# Patient Record
Sex: Female | Born: 1985 | Race: White | Hispanic: No | Marital: Married | Smoking: Former smoker
Health system: Southern US, Community
[De-identification: ages and names within clinical notes are randomized; demographics above are authoritative.]

## PROBLEM LIST (undated history)

## (undated) DIAGNOSIS — A4902 Methicillin resistant Staphylococcus aureus infection, unspecified site: Secondary | ICD-10-CM

## (undated) DIAGNOSIS — J45909 Unspecified asthma, uncomplicated: Secondary | ICD-10-CM

## (undated) DIAGNOSIS — F329 Major depressive disorder, single episode, unspecified: Secondary | ICD-10-CM

## (undated) DIAGNOSIS — J309 Allergic rhinitis, unspecified: Secondary | ICD-10-CM

## (undated) DIAGNOSIS — F410 Panic disorder [episodic paroxysmal anxiety] without agoraphobia: Secondary | ICD-10-CM

## (undated) DIAGNOSIS — K449 Diaphragmatic hernia without obstruction or gangrene: Secondary | ICD-10-CM

## (undated) DIAGNOSIS — F32A Depression, unspecified: Secondary | ICD-10-CM

## (undated) DIAGNOSIS — R519 Headache, unspecified: Secondary | ICD-10-CM

## (undated) DIAGNOSIS — K219 Gastro-esophageal reflux disease without esophagitis: Secondary | ICD-10-CM

## (undated) DIAGNOSIS — R112 Nausea with vomiting, unspecified: Secondary | ICD-10-CM

## (undated) DIAGNOSIS — R000 Tachycardia, unspecified: Secondary | ICD-10-CM

## (undated) DIAGNOSIS — K2210 Ulcer of esophagus without bleeding: Secondary | ICD-10-CM

## (undated) DIAGNOSIS — R9431 Abnormal electrocardiogram [ECG] [EKG]: Secondary | ICD-10-CM

## (undated) DIAGNOSIS — R51 Headache: Secondary | ICD-10-CM

## (undated) DIAGNOSIS — K589 Irritable bowel syndrome without diarrhea: Secondary | ICD-10-CM

## (undated) DIAGNOSIS — J189 Pneumonia, unspecified organism: Secondary | ICD-10-CM

## (undated) DIAGNOSIS — Z9889 Other specified postprocedural states: Secondary | ICD-10-CM

## (undated) DIAGNOSIS — R12 Heartburn: Secondary | ICD-10-CM

## (undated) DIAGNOSIS — F411 Generalized anxiety disorder: Secondary | ICD-10-CM

## (undated) DIAGNOSIS — F419 Anxiety disorder, unspecified: Secondary | ICD-10-CM

## (undated) DIAGNOSIS — D649 Anemia, unspecified: Secondary | ICD-10-CM

## (undated) DIAGNOSIS — Z87442 Personal history of urinary calculi: Secondary | ICD-10-CM

## (undated) DIAGNOSIS — O99019 Anemia complicating pregnancy, unspecified trimester: Secondary | ICD-10-CM

## (undated) DIAGNOSIS — E785 Hyperlipidemia, unspecified: Secondary | ICD-10-CM

## (undated) HISTORY — PX: OVARIAN CYST REMOVAL: SHX89

## (undated) HISTORY — DX: Ulcer of esophagus without bleeding: K22.10

## (undated) HISTORY — DX: Depression, unspecified: F32.A

## (undated) HISTORY — DX: Unspecified asthma, uncomplicated: J45.909

## (undated) HISTORY — DX: Allergic rhinitis, unspecified: J30.9

## (undated) HISTORY — PX: TONSILLECTOMY: SUR1361

## (undated) HISTORY — DX: Heartburn: R12

## (undated) HISTORY — DX: Diaphragmatic hernia without obstruction or gangrene: K44.9

## (undated) HISTORY — DX: Panic disorder (episodic paroxysmal anxiety): F41.0

## (undated) HISTORY — DX: Anxiety disorder, unspecified: F41.9

## (undated) HISTORY — PX: PLANTAR FASCIA SURGERY: SHX746

## (undated) HISTORY — DX: Irritable bowel syndrome without diarrhea: K58.9

## (undated) HISTORY — PX: TUBAL LIGATION: SHX77

## (undated) HISTORY — DX: Major depressive disorder, single episode, unspecified: F32.9

## (undated) HISTORY — PX: TONSILLECTOMY: SHX5217

## (undated) HISTORY — DX: Methicillin resistant Staphylococcus aureus infection, unspecified site: A49.02

## (undated) HISTORY — PX: ESOPHAGOGASTRODUODENOSCOPY: SHX1529

---

## 2005-10-20 ENCOUNTER — Ambulatory Visit: Payer: Self-pay | Admitting: Urology

## 2005-12-17 ENCOUNTER — Emergency Department: Payer: Self-pay | Admitting: Emergency Medicine

## 2009-09-28 ENCOUNTER — Ambulatory Visit: Payer: Self-pay | Admitting: Unknown Physician Specialty

## 2011-01-30 ENCOUNTER — Encounter: Payer: Self-pay | Admitting: Obstetrics & Gynecology

## 2011-04-29 ENCOUNTER — Encounter: Payer: Self-pay | Admitting: Pediatric Cardiology

## 2011-09-02 ENCOUNTER — Inpatient Hospital Stay: Payer: Self-pay | Admitting: Obstetrics & Gynecology

## 2012-12-19 ENCOUNTER — Emergency Department: Payer: Self-pay | Admitting: Emergency Medicine

## 2012-12-19 LAB — URINALYSIS, COMPLETE
Bacteria: NONE SEEN
Bilirubin,UR: NEGATIVE
Glucose,UR: NEGATIVE mg/dL (ref 0–75)
Ketone: NEGATIVE
Nitrite: NEGATIVE
Protein: NEGATIVE
RBC,UR: 1 /HPF (ref 0–5)
WBC UR: 1 /HPF (ref 0–5)

## 2012-12-19 LAB — DRUG SCREEN, URINE
Amphetamines, Ur Screen: NEGATIVE (ref ?–1000)
Barbiturates, Ur Screen: NEGATIVE (ref ?–200)
Cocaine Metabolite,Ur ~~LOC~~: NEGATIVE (ref ?–300)
Opiate, Ur Screen: NEGATIVE (ref ?–300)
Phencyclidine (PCP) Ur S: NEGATIVE (ref ?–25)
Tricyclic, Ur Screen: NEGATIVE (ref ?–1000)

## 2012-12-19 LAB — CBC
HCT: 39.1 % (ref 35.0–47.0)
MCH: 31.4 pg (ref 26.0–34.0)
MCV: 89 fL (ref 80–100)
RDW: 12.1 % (ref 11.5–14.5)

## 2012-12-19 LAB — COMPREHENSIVE METABOLIC PANEL
Alkaline Phosphatase: 81 U/L (ref 50–136)
Chloride: 108 mmol/L — ABNORMAL HIGH (ref 98–107)
Creatinine: 0.57 mg/dL — ABNORMAL LOW (ref 0.60–1.30)
EGFR (Non-African Amer.): >60
Glucose: 103 mg/dL — ABNORMAL HIGH (ref 65–99)
Osmolality: 285 (ref 275–301)
Potassium: 3.6 mmol/L (ref 3.5–5.1)
SGOT(AST): 21 U/L (ref 15–37)
SGPT (ALT): 26 U/L (ref 12–78)
Sodium: 143 mmol/L (ref 136–145)
Total Protein: 7.3 g/dL (ref 6.4–8.2)

## 2012-12-19 LAB — SALICYLATE LEVEL: Salicylates, Serum: <1.7 mg/dL

## 2012-12-19 LAB — ETHANOL: Ethanol %: <0.003 % (ref 0.000–0.080)

## 2012-12-19 LAB — TSH: Thyroid Stimulating Horm: 0.774 u[IU]/mL

## 2013-08-02 ENCOUNTER — Ambulatory Visit: Payer: Self-pay | Admitting: Urgent Care

## 2013-08-16 ENCOUNTER — Ambulatory Visit: Payer: Self-pay | Admitting: Obstetrics & Gynecology

## 2013-08-16 LAB — CBC
HCT: 38.8 % (ref 35.0–47.0)
MCV: 89 fL (ref 80–100)
Platelet: 320 10*3/uL (ref 150–440)
RDW: 11.6 % (ref 11.5–14.5)

## 2013-08-18 ENCOUNTER — Ambulatory Visit: Payer: Self-pay | Admitting: Obstetrics & Gynecology

## 2013-08-26 ENCOUNTER — Emergency Department: Payer: Self-pay | Admitting: Emergency Medicine

## 2014-01-15 DIAGNOSIS — K589 Irritable bowel syndrome without diarrhea: Secondary | ICD-10-CM

## 2014-01-15 HISTORY — DX: Irritable bowel syndrome, unspecified: K58.9

## 2014-01-15 HISTORY — PX: COLONOSCOPY: SHX174

## 2014-02-02 ENCOUNTER — Ambulatory Visit: Payer: Self-pay | Admitting: Gastroenterology

## 2014-12-15 DIAGNOSIS — J189 Pneumonia, unspecified organism: Secondary | ICD-10-CM

## 2014-12-15 HISTORY — DX: Pneumonia, unspecified organism: J18.9

## 2015-04-06 NOTE — Op Note (Signed)
PATIENT NAME:  [name], Deborah H MR#:  [id number] DATE OF BIRTH:  May 05, 1986  DATE OF PROCEDURE:  08/18/2013  PREOPERATIVE DIAGNOSES:  Pelvic pain, ovarian cyst.   POSTOPERATIVE DIAGNOSES: Right lower quadrant pain, bilateral tubal cysts, possible endometriosis.   PROCEDURE PERFORMED: Operative laparoscopy, excision of bilateral tubal cysts, right greater than left; peritoneal biopsy of lesion, possible endometriosis, biopsy and excision of omental nodule near sigmoid colon.   SURGEON: Annamarie MajorPaul Landan Fedie, M.D.   ANESTHESIA: General.   ESTIMATED BLOOD LOSS: Minimal.   COMPLICATIONS: None.   FINDINGS: Bilateral tubal cysts, right greater than left; white plaque disease, possible endometriosis lining the peritoneum, omental nodule near the sigmoid, normal ovaries and uterus.   DISPOSITION: To the recovery room in stable condition.   TECHNIQUE: The patient is prepped in the usual sterile fashion after adequate anesthesia is obtained in the dorsal lithotomy position. Foley catheter is inserted, and a Hulka tenaculum is placed on the cervix.   Attention is then turned to the abdomen where a Veress needle is inserted through a 5 mm infraumbilical incision after Marcaine is used to anesthetize the skin. Veress needle placement is confirmed using the hanging drop technique, and the abdomen is then insufflated with CO2 gas. A 5 mm trocar is then inserted under visualization with the laparoscope with no injuries or bleeding noted. The patient is placed in Trendelenburg positioning, and the above-mentioned findings were visualized.   A 5 mm trocar is placed on the left lower quadrant lateral to the inferior epigastric blood vessels, and an 11 mm trocar is placed in the suprapubic region with no injuries or bleeding noted. The omental nodule overlying the sigmoid colon is grasped and has no connection to the serosa over the colon. It is excised using the 5 mm Harmonic scalpel and sent to pathology for further  review. The right tubal cyst approximately 2 cm in diameter is carefully dissected away from the fallopian tube, excised and sent to pathology for further review. The left smaller tubal cysts are also excised and sent to pathology for further review using the Harmonic scalpel. Excellent hemostasis was noted at all the operative sites. There were several areas of a white plaque-like disease along the peritoneal surfaces and 1 area near the left uterosacral ligament is carefully tented up and dissected free from the underlying tissues and sent to pathology for further analysis. Excellent hemostasis was noted at this site as well. The pelvic cavity is irrigated with aspiration of all fluid. Excellent hemostasis is noted. There is no apparent injury to bowel, bladder, ureter or other structures. Gas is expelled. The patient is leveled. Trocars were removed, and skin is closed with Dermabond. Foley the tenaculum were removed. The patient goes to the recovery room in stable condition. All sponge, instrument and needle counts were correct.    ____________________________ R. Annamarie MajorPaul Mitchelle Goerner, MD rph:dmm D: 08/18/2013 15:10:02 ET T: 08/18/2013 15:26:49 ET JOB#: [id number]  cc: Dierdre Searles. Paul Jayvan Mcshan, MD, <Dictator> Nadara MustardOBERT P Bexleigh Theriault MD ELECTRONICALLY SIGNED 08/18/2013 18:15

## 2015-04-09 ENCOUNTER — Ambulatory Visit
Admit: 2015-04-09 | Disposition: A | Discharge status: Home / Self Care | Payer: Self-pay | Attending: Unknown Physician Specialty | Admitting: Unknown Physician Specialty

## 2015-05-16 ENCOUNTER — Other Ambulatory Visit: Payer: Self-pay | Admitting: Family Medicine

## 2015-05-16 ENCOUNTER — Telehealth: Payer: Self-pay

## 2015-05-16 ENCOUNTER — Ambulatory Visit
Admission: RE | Admit: 2015-05-16 | Discharge: 2015-05-16 | Disposition: A | Discharge status: Home / Self Care | Payer: 59 | Source: Ambulatory Visit | Attending: Family Medicine | Admitting: Family Medicine

## 2015-05-16 DIAGNOSIS — M5410 Radiculopathy, site unspecified: Secondary | ICD-10-CM

## 2015-05-16 DIAGNOSIS — M5412 Radiculopathy, cervical region: Secondary | ICD-10-CM | POA: Insufficient documentation

## 2015-05-16 NOTE — Telephone Encounter (Signed)
Pt. Notified.

## 2015-05-16 NOTE — Telephone Encounter (Signed)
-----   Message from Kerman PasseyMelinda P Lada, MD sent at 05/16/2015  3:40 PM EDT ----- Let pt know that her xray is indeed abnormal, and let's get the cervical spine MRI (already ordered) Nothing worrisome, but she may have a developmental problem from childhood that contributes Thank you

## 2015-05-21 ENCOUNTER — Telehealth: Payer: Self-pay | Admitting: Family Medicine

## 2015-05-21 NOTE — Telephone Encounter (Signed)
Pt. Notified that the referral was still in progress and that i'll check with Tiffany in the morning about the status of it.

## 2015-05-21 NOTE — Telephone Encounter (Signed)
Pt called wanting to know what was going on with her MRI.  She declined talking to referral coordinator, states she felt coordinator was rude.  Please call her.

## 2015-05-23 ENCOUNTER — Other Ambulatory Visit: Payer: Self-pay | Admitting: Family Medicine

## 2015-05-23 DIAGNOSIS — M5412 Radiculopathy, cervical region: Secondary | ICD-10-CM

## 2015-05-29 ENCOUNTER — Encounter: Payer: Self-pay | Admitting: Family Medicine

## 2015-05-29 ENCOUNTER — Ambulatory Visit
Admission: RE | Admit: 2015-05-29 | Discharge: 2015-05-29 | Disposition: A | Discharge status: Home / Self Care | Payer: 59 | Source: Ambulatory Visit | Attending: Family Medicine | Admitting: Family Medicine

## 2015-05-29 ENCOUNTER — Telehealth: Payer: Self-pay | Admitting: Family Medicine

## 2015-05-29 DIAGNOSIS — M5412 Radiculopathy, cervical region: Secondary | ICD-10-CM | POA: Insufficient documentation

## 2015-05-29 NOTE — Telephone Encounter (Signed)
MRI back I typed a letter, but can't print it from home Please send Referral to neuro entered

## 2015-05-30 ENCOUNTER — Telehealth: Payer: Self-pay | Admitting: Family Medicine

## 2015-05-30 MED ORDER — GABAPENTIN 300 MG PO CAPS: 300.0000 mg | ORAL_CAPSULE | Freq: Three times a day (TID) | ORAL | Status: DC

## 2015-05-30 NOTE — Telephone Encounter (Signed)
I called patient Explained why the referral to neurologist about the radiculopathy She was going to see Dr. Clelia Croft, headaches, appt is July 11th Let's increase gabapentin  Deborah Fields --> please see if Dr. Clelia Croft can see her for BOTH issues (cervical radiculopathy and headaches) so she doesn't have to see two different neurologist, and please call patient and update both neurologists with appt info (patient has not scheduled yet with )

## 2015-05-30 NOTE — Telephone Encounter (Signed)
Pt said neurology called and shes not sure why she needs to schedule appt because she hasnt heard results from mri yesterday

## 2015-05-30 NOTE — Telephone Encounter (Signed)
Dr. Sherie Don, I know you wrote a letter and it's being mailed to her, but now she has already gotten a call about the referral. I know you put info about the results, but she will be asking more info than I know about the results.

## 2015-06-29 ENCOUNTER — Other Ambulatory Visit: Payer: Self-pay

## 2015-06-29 MED ORDER — PROMETHAZINE HCL 25 MG PO TABS: 25.0000 mg | ORAL_TABLET | Freq: Four times a day (QID) | ORAL | Status: DC | PRN

## 2015-06-29 NOTE — Telephone Encounter (Signed)
Patient was last seen on 05/10/15, practice partner number is [id number], and [facility] [address]

## 2015-07-10 ENCOUNTER — Telehealth: Payer: Self-pay | Admitting: Family Medicine

## 2015-07-10 ENCOUNTER — Telehealth: Payer: Self-pay | Admitting: Unknown Physician Specialty

## 2015-07-10 MED ORDER — PROMETHAZINE HCL 25 MG PO TABS: 25.0000 mg | ORAL_TABLET | Freq: Four times a day (QID) | ORAL | Status: DC | PRN

## 2015-07-10 NOTE — Telephone Encounter (Signed)
Patient just had it refilled on 06/29/15. Left message for patient to call and see if she needs a refill already.

## 2015-07-10 NOTE — Telephone Encounter (Signed)
done

## 2015-07-10 NOTE — Telephone Encounter (Signed)
Pt called stated pharmacy has sent several faxes for a refill on Phenergen with no response. [facility] [address] in [place]. Thanks.

## 2015-07-10 NOTE — Telephone Encounter (Signed)
The prescription was printed instead of being sent straight to the pharmacy. Patient wants to know if it can be resent straight to the pharmacy or if she needs to come pick it up. [facility] [address].

## 2015-07-10 NOTE — Telephone Encounter (Signed)
[facility] [address] Called needing refill on Deborah Fields Rx: promethazine (PHENERGAN) 25 MG tablet.

## 2015-07-11 NOTE — Telephone Encounter (Signed)
I spoke with patient, she said she didn't need it refilled, the refill message was sent because she didn't realize Elnita Maxwell had wrote it and left it here for her to pick up the rx.

## 2015-09-12 ENCOUNTER — Encounter: Payer: Self-pay | Admitting: Family Medicine

## 2015-09-12 ENCOUNTER — Ambulatory Visit (INDEPENDENT_AMBULATORY_CARE_PROVIDER_SITE_OTHER): Payer: 59 | Admitting: Family Medicine

## 2015-09-12 VITALS — BP 122/78 | HR 104 | Temp 98.9°F | Ht 66.5 in | Wt 205.0 lb

## 2015-09-12 DIAGNOSIS — J0101 Acute recurrent maxillary sinusitis: Secondary | ICD-10-CM | POA: Diagnosis not present

## 2015-09-12 DIAGNOSIS — A4902 Methicillin resistant Staphylococcus aureus infection, unspecified site: Secondary | ICD-10-CM | POA: Insufficient documentation

## 2015-09-12 DIAGNOSIS — F410 Panic disorder [episodic paroxysmal anxiety] without agoraphobia: Secondary | ICD-10-CM | POA: Insufficient documentation

## 2015-09-12 DIAGNOSIS — H6693 Otitis media, unspecified, bilateral: Secondary | ICD-10-CM | POA: Diagnosis not present

## 2015-09-12 DIAGNOSIS — F419 Anxiety disorder, unspecified: Secondary | ICD-10-CM | POA: Insufficient documentation

## 2015-09-12 DIAGNOSIS — J309 Allergic rhinitis, unspecified: Secondary | ICD-10-CM | POA: Insufficient documentation

## 2015-09-12 DIAGNOSIS — F32A Depression, unspecified: Secondary | ICD-10-CM | POA: Insufficient documentation

## 2015-09-12 DIAGNOSIS — K589 Irritable bowel syndrome without diarrhea: Secondary | ICD-10-CM | POA: Insufficient documentation

## 2015-09-12 DIAGNOSIS — F329 Major depressive disorder, single episode, unspecified: Secondary | ICD-10-CM | POA: Insufficient documentation

## 2015-09-12 DIAGNOSIS — J45909 Unspecified asthma, uncomplicated: Secondary | ICD-10-CM | POA: Insufficient documentation

## 2015-09-12 MED ORDER — AMOXICILLIN-POT CLAVULANATE 875-125 MG PO TABS: 1.0000 | ORAL_TABLET | Freq: Two times a day (BID) | ORAL | Status: DC

## 2015-09-12 NOTE — Progress Notes (Signed)
BP 122/78 mmHg  Pulse 104  Temp(Src) 98.9 F (37.2 C)  Ht 5' 6.5" (1.689 m)  Wt 205 lb (92.987 kg)  BMI 32.60 kg/m2  SpO2 96%  LMP 08/12/2015 (Approximate)  Temp is after taking tylenol  Subjective:    Patient ID: Deborah Fields, female    DOB: [birth year], 29 y.o.   MRN: [id number]  HPI: Deborah Fields is a 29 y.o. female  Chief Complaint  Patient presents with  . URI    having URI symptoms and ear pain and wanted to make sure she is not getting pneumonia   Patient is here for an acute visit She feels short of breath; a little catchy; heart rate is fast; really dizzy; started to get sick Thursday of last week; congested in the head mostly, and going into the chest; the left ear bothers and wishes she could poke a hole in it; hearing decreased on the left; sore throat No tick bites or travel  She has tried mucinex which is not helping; sudafed also but the one with phenylephrine (not pseudoephedrine); she took just a half of one pill this morning; feels jittery  She just finished Macrobid for a UTI last week  Hx of sinus infections and this feels like one No visits to known sick contacts, but she has been in an ENT office and [place] [facility] office recently No travel Nonsmoker  Relevant past medical, surgical, family and social history reviewed and updated as indicated. Interim medical history since our last visit reviewed. Allergies and medications reviewed and updated.  Review of Systems  Constitutional: Positive for fever (sweaty and clammy). Negative for chills.  HENT: Positive for congestion, ear pain, hearing loss, postnasal drip, sinus pressure (left side mostly, but can change with laying on one side for a while), sore throat and voice change.   Eyes: Negative for discharge.  Respiratory: Positive for cough (a little cough last night; not bringing anything up yet).   Gastrointestinal: Positive for nausea (uses phenergan for IBS and taking some of this;  dizzy and nauseated with nose blowing and moving in general). Negative for vomiting.  Skin: Negative for rash.  Neurological: Positive for dizziness and headaches.  Hematological: Positive for adenopathy (neck hurts, front and back).  Per HPI unless specifically indicated above     Objective:    BP 122/78 mmHg  Pulse 104  Temp(Src) 98.9 F (37.2 C)  Ht 5' 6.5" (1.689 m)  Wt 205 lb (92.987 kg)  BMI 32.60 kg/m2  SpO2 96%  LMP 08/12/2015 (Approximate)  Wt Readings from Last 3 Encounters:  09/12/15 205 lb (92.987 kg)  05/10/15 200 lb (90.719 kg)  05/29/15 200 lb (90.719 kg)    Physical Exam  Constitutional: She appears well-developed and well-nourished. No distress.  HENT:  Head: Normocephalic and atraumatic.  Right Ear: No drainage or swelling. Tympanic membrane is injected, erythematous and bulging. Tympanic membrane is not scarred and not perforated. No middle ear effusion. No decreased hearing is noted.  Left Ear: No drainage or swelling. Tympanic membrane is injected, erythematous and bulging. Tympanic membrane is not scarred and not perforated.  No middle ear effusion. Decreased hearing is noted.  Nose: Mucosal edema and rhinorrhea present. Right sinus exhibits maxillary sinus tenderness and frontal sinus tenderness. Left sinus exhibits maxillary sinus tenderness (left worse than right) and frontal sinus tenderness (left worse than right).  Mouth/Throat: Uvula is midline, oropharynx is clear and moist and mucous membranes are normal. No oropharyngeal  exudate, posterior oropharyngeal edema or posterior oropharyngeal erythema.  Eyes: EOM are normal. No scleral icterus.  Neck: No thyromegaly present.  Cardiovascular: Regular rhythm and normal heart sounds.  Tachycardia present.   No murmur heard. Pulmonary/Chest: Effort normal and breath sounds normal. No respiratory distress. She has no wheezes.  Abdominal: Soft.  Musculoskeletal: Normal range of motion. She exhibits no edema.   Lymphadenopathy:    She has cervical adenopathy (shoddy).  Neurological: She is alert. She exhibits normal muscle tone.  Skin: Skin is warm and dry. She is not diaphoretic. No pallor.  Psychiatric: She has a normal mood and affect. Her behavior is normal. Judgment and thought content normal.      Assessment & Plan:   Problem List Items Addressed This Visit      Respiratory   Acute recurrent maxillary sinusitis - Primary    New problem; significantly sick, tachycardic; not feeling well; out of work the rest of this week; start prescription antibiotics; rest, hydration, symptomatic care; reasons to call or seek care reviewed      Relevant Medications   amoxicillin-clavulanate (AUGMENTIN) 875-125 MG tablet     Nervous and Auditory   Bilateral infective otitis media    New problem; significantly sick, tachycardic; not feeling well; out of work the rest of this week; start prescription antibiotics; rest, hydration, symptomatic care; reasons to call or seek care reviewed      Relevant Medications   amoxicillin-clavulanate (AUGMENTIN) 875-125 MG tablet      On OCP; explained risk of causing hormonal contraception to be less effective on antibiotics Discussed risk of C diff, yogurt or probiotics  Follow up plan: Return if symptoms worsen or fail to improve.  An after-visit summary was printed and given to the patient at check-out.  Please see the patient instructions which may contain other information and recommendations beyond what is mentioned above in the assessment and plan. Meds ordered this encounter  Medications  . clonazePAM (KLONOPIN) 0.5 MG tablet    Sig: Take 0.5 mg by mouth 2 (two) times daily as needed.    Refill:  3  . JUNEL FE 1/20 1-20 MG-MCG tablet    Sig: TAKE 1 TABLET BY ORAL ROUTE ONCE DAILY FOR 28 DAYS    Refill:  11  . amoxicillin-clavulanate (AUGMENTIN) 875-125 MG tablet    Sig: Take 1 tablet by mouth 2 (two) times daily.    Dispense:  20 tablet     Refill:  0

## 2015-09-12 NOTE — Patient Instructions (Signed)
Please do eat yogurt daily or take a probiotic daily for the next month or two We want to replace the healthy germs in the gut If you notice foul, watery diarrhea in the next two months, schedule an appointment RIGHT AWAY Try vitamin C (orange juice if not diabetic or vitamin C tablets) and drink green tea to help your immune system during your illness Get plenty of rest and hydration Start the antibiotics Be aware that hormonal contraception may be rendered ineffective with antibiotics, so use precautions to help prevent unintended pregnancy OUT OF WORK the rest of this week (Wednesday, Thursday, Friday) Use over-the-counter cough/cold medicine per package directions Call or seek care if getting worse

## 2015-09-12 NOTE — Assessment & Plan Note (Signed)
New problem; significantly sick, tachycardic; not feeling well; out of work the rest of this week; start prescription antibiotics; rest, hydration, symptomatic care; reasons to call or seek care reviewed 

## 2015-09-12 NOTE — Assessment & Plan Note (Signed)
New problem; significantly sick, tachycardic; not feeling well; out of work the rest of this week; start prescription antibiotics; rest, hydration, symptomatic care; reasons to call or seek care reviewed

## 2015-09-19 ENCOUNTER — Other Ambulatory Visit: Payer: Self-pay

## 2015-09-19 MED ORDER — PROMETHAZINE HCL 25 MG PO TABS: 25.0000 mg | ORAL_TABLET | Freq: Four times a day (QID) | ORAL | Status: DC | PRN

## 2015-09-19 NOTE — Telephone Encounter (Signed)
PATIENT: Deborah Fields DOB: 14-Mar-1986 [facility] [address] [place] LAST VISIT: ACUTE VISIT WITH DR. LADA 09/12/2015  Patient requests a refill for Promethazine 25 mg tab. The medication was previously prescribed 07/10/2015 with 0 refills.

## 2015-09-20 ENCOUNTER — Telehealth: Payer: Self-pay | Admitting: Family Medicine

## 2015-09-20 MED ORDER — DOXYCYCLINE HYCLATE 100 MG PO TABS: 100.0000 mg | ORAL_TABLET | Freq: Two times a day (BID) | ORAL | Status: DC

## 2015-09-20 NOTE — Telephone Encounter (Signed)
She has been taking the Augmentin but has been feeling horrible on it this round and having diarrhea with it. She did not take it last night or this morning and her stomach is feeling much better. She would like a rx for a different antibiotic. She states she feels like she still does need one and that her sinus symptoms are not resolved yet.

## 2015-09-20 NOTE — Telephone Encounter (Signed)
Pt having a tough time with medication she received last week.  Would like a call with suggestions.

## 2015-09-20 NOTE — Telephone Encounter (Signed)
New Rx sent in with instructions on the SIG

## 2015-09-21 ENCOUNTER — Telehealth: Payer: Self-pay

## 2015-09-21 NOTE — Telephone Encounter (Signed)
Called patient back to ask if she'd rather pick the prescription up or have it faxed to the pharmacy (not controlled substance). She said she'd rather have the prescription faxed to her pharmacy.

## 2015-09-21 NOTE — Telephone Encounter (Signed)
Called patient at 8:06am and she answered. I called to notify her that her prescription for [facility] 25 MG was filled and here at the office for her to pick up. Told patient she could come in sometime today to pick up the prescription.

## 2015-10-28 ENCOUNTER — Other Ambulatory Visit: Payer: Self-pay | Admitting: Family Medicine

## 2015-10-30 ENCOUNTER — Ambulatory Visit (INDEPENDENT_AMBULATORY_CARE_PROVIDER_SITE_OTHER): Payer: 59 | Admitting: Gastroenterology

## 2015-10-30 ENCOUNTER — Encounter: Payer: Self-pay | Admitting: Gastroenterology

## 2015-10-30 VITALS — BP 112/78 | HR 105 | Temp 98.2°F | Ht 67.0 in | Wt 209.6 lb

## 2015-10-30 DIAGNOSIS — K589 Irritable bowel syndrome without diarrhea: Secondary | ICD-10-CM | POA: Diagnosis not present

## 2015-10-30 MED ORDER — DICYCLOMINE HCL 20 MG PO TABS: 20.0000 mg | ORAL_TABLET | Freq: Three times a day (TID) | ORAL | Status: DC

## 2015-10-30 NOTE — Progress Notes (Signed)
Primary Care Physician: Baruch Gouty, MD  Primary Gastroenterologist:  Dr. Midge Minium  Chief Complaint  Patient presents with  . Abdominal Pain    with nausea  . Diarrhea    HPI: Deborah Fields is a 29 y.o. female here off of irritable bowel syndrome. The patient reports that she continues to have some nausea with alternating diarrhea and constipation with abdominal cramps. The patient states that when she takes Imodium for the diarrhea she then results in having constipation. Is no report of any unexplained weight loss, fevers, chills, nausea or vomiting. The patient has had an EGD and colonoscopy back in 2014 that did not show any cause for her symptoms.  Current Outpatient Prescriptions  Medication Sig Dispense Refill  . albuterol (PROAIR HFA) 108 (90 BASE) MCG/ACT inhaler Inhale 2 puffs into the lungs every 6 (six) hours as needed.     . clonazePAM (KLONOPIN) 0.5 MG tablet Take 0.5 mg by mouth 2 (two) times daily as needed.  3  . dexlansoprazole (DEXILANT) 60 MG capsule Take 60 mg by mouth daily.     . fluticasone (FLONASE) 50 MCG/ACT nasal spray Place 2 sprays into both nostrils daily.    . JUNEL FE 1/20 1-20 MG-MCG tablet TAKE 1 TABLET BY ORAL ROUTE ONCE DAILY FOR 28 DAYS  11  . promethazine (PHENERGAN) 25 MG tablet TAKE 1 TABLET BY MOUTH EVERY 6 HOURS AS NEEDED FOR NAUSEA OR VOMITING 30 tablet 0  . sertraline (ZOLOFT) 100 MG tablet Take 200 mg by mouth daily.  3  . cyclobenzaprine (FLEXERIL) 10 MG tablet Take 10 mg by mouth every 8 (eight) hours as needed for muscle spasms.    Marland Kitchen dicyclomine (BENTYL) 20 MG tablet Take 1 tablet (20 mg total) by mouth 3 (three) times daily before meals. 90 tablet 3  . doxycycline (VIBRA-TABS) 100 MG tablet Take 1 tablet (100 mg total) by mouth 2 (two) times daily. STOP amoxicillin/clavulanic acid; take probiotics or eat yogurt daily x 2 weeks (Patient not taking: Reported on 10/30/2015) 14 tablet 0  . Fluticasone-Salmeterol (ADVAIR DISKUS) 500-50  MCG/DOSE AEPB Inhale 1 puff into the lungs 2 (two) times daily.    Marland Kitchen gabapentin (NEURONTIN) 300 MG capsule Take 1 capsule (300 mg total) by mouth 3 (three) times daily. (Patient not taking: Reported on 09/12/2015) 90 capsule 3   No current facility-administered medications for this visit.    Allergies as of 10/30/2015 - Review Complete 09/12/2015  Allergen Reaction Noted  . Sulfa antibiotics Rash 09/12/2015    ROS:  General: Negative for anorexia, weight loss, fever, chills, fatigue, weakness. ENT: Negative for hoarseness, difficulty swallowing , nasal congestion. CV: Negative for chest pain, angina, palpitations, dyspnea on exertion, peripheral edema.  Respiratory: Negative for dyspnea at rest, dyspnea on exertion, cough, sputum, wheezing.  GI: See history of present illness. GU:  Negative for dysuria, hematuria, urinary incontinence, urinary frequency, nocturnal urination.  Endo: Negative for unusual weight change.    Physical Examination:   BP 112/78 mmHg  Pulse 105  Temp(Src) 98.2 F (36.8 C) (Oral)  Ht  (1.702 m)  Wt 209 lb 9.6 oz (95.074 kg)  BMI 32.82 kg/m2  General: Well-nourished, well-developed in no acute distress.  Eyes: No icterus. Conjunctivae pink. Mouth: Oropharyngeal mucosa moist and pink , no lesions erythema or exudate. Lungs: Clear to auscultation bilaterally. Non-labored. Heart: Regular rate and rhythm, no murmurs rubs or gallops.  Abdomen: Bowel sounds are normal, nontender, nondistended, no hepatosplenomegaly or masses, no  abdominal bruits or hernia , no rebound or guarding.   Extremities: No lower extremity edema. No clubbing or deformities. Neuro: Alert and oriented x 3.  Grossly intact. Skin: Warm and dry, no jaundice.   Psych: Alert and cooperative, normal mood and affect.  Labs:    Imaging Studies: No results found.  Assessment and Plan:   Deborah Fields is a 29 y.o. y/o female with a history of irritable bowel syndrome. The patient  will be put on dicyclomine 20 g 3 times a day. The patient will also continue the Dexilant. She has been told to try to use fiber instead of the Imodium to help regulate her bowel movements. The patient will contact me if she has any further problems.   Note: This dictation was prepared with Dragon dictation along with smaller phrase technology. Any transcriptional errors that result from this process are unintentional.

## 2015-11-19 ENCOUNTER — Other Ambulatory Visit: Payer: Self-pay | Admitting: Family Medicine

## 2015-11-19 NOTE — Telephone Encounter (Signed)
Routing to provider  

## 2015-11-20 NOTE — Telephone Encounter (Signed)
I haven't seen patient in months; benzo is on her list of meds; she needs to call to let us know what's going on; won't just refill without talking to her

## 2015-12-21 ENCOUNTER — Ambulatory Visit
Admission: RE | Admit: 2015-12-21 | Discharge: 2015-12-21 | Disposition: A | Discharge status: Home / Self Care | Payer: 59 | Source: Ambulatory Visit | Attending: Family Medicine | Admitting: Family Medicine

## 2015-12-21 ENCOUNTER — Ambulatory Visit (INDEPENDENT_AMBULATORY_CARE_PROVIDER_SITE_OTHER): Payer: 59 | Admitting: Family Medicine

## 2015-12-21 ENCOUNTER — Encounter: Payer: Self-pay | Admitting: Family Medicine

## 2015-12-21 VITALS — BP 115/79 | HR 96 | Temp 98.8°F | Ht 67.0 in | Wt 211.2 lb

## 2015-12-21 DIAGNOSIS — H5319 Other subjective visual disturbances: Secondary | ICD-10-CM | POA: Diagnosis not present

## 2015-12-21 DIAGNOSIS — G44311 Acute post-traumatic headache, intractable: Secondary | ICD-10-CM | POA: Diagnosis not present

## 2015-12-21 DIAGNOSIS — R519 Headache, unspecified: Secondary | ICD-10-CM | POA: Insufficient documentation

## 2015-12-21 DIAGNOSIS — R110 Nausea: Secondary | ICD-10-CM | POA: Diagnosis not present

## 2015-12-21 DIAGNOSIS — K589 Irritable bowel syndrome without diarrhea: Secondary | ICD-10-CM | POA: Diagnosis not present

## 2015-12-21 DIAGNOSIS — H53149 Visual discomfort, unspecified: Secondary | ICD-10-CM

## 2015-12-21 DIAGNOSIS — J4520 Mild intermittent asthma, uncomplicated: Secondary | ICD-10-CM | POA: Diagnosis not present

## 2015-12-21 DIAGNOSIS — R51 Headache: Secondary | ICD-10-CM

## 2015-12-21 DIAGNOSIS — S0990XA Unspecified injury of head, initial encounter: Secondary | ICD-10-CM

## 2015-12-21 NOTE — Assessment & Plan Note (Signed)
Discussed the risk of "asthma-related death" with one component of the Advair; that medicine is not meant to be used PRN; if she needs something above and beyond her rescue inhaler, she should talk to her primary or her pulmonologist about plain corticosteroid which would not have that same risk

## 2015-12-21 NOTE — Assessment & Plan Note (Addendum)
Patient no longer using bentyl regularly because of side effects

## 2015-12-21 NOTE — Progress Notes (Signed)
BP 115/79 mmHg  Pulse 96  Temp(Src) 98.8 F (37.1 C)  Ht 5\' 7"  (1.702 m)  Wt 211 lb 3.2 oz (95.8 kg)  BMI 33.07 kg/m2  SpO2 98%  LMP 12/10/2015 (Exact Date)   Subjective:    Patient ID: Deborah Fields, female    DOB: 08/22/1986, 30 y.o.   MRN: [id number]  HPI: Deborah Fields is a 30 y.o. female  Chief Complaint  Patient presents with  . Headache    Pt hit her head on bedside table Tuesday morning and has had a headache ever since.   Hinda Glatter. Knot on Head    Knot on left side of forehead.     She hit the corner of the bedside table on Tuesday with the left side forehead; no LOC; did not see stars; it hurt bad enough that she laid back down for a second; vision did not go blurry; just brushed it off and laughed about it; she got a headache on Tuesday afternoon and it has not let up; the headache is moderate; she would prefer darker room; no previous head injury; no vision problems now; no ringing in the ears; tingling on the scalp along the left temporal area; no change in sense of taste or smell; no weakness or numbness of extremities; no confusion or trouble thinking; she woke up around 3 am this morning  Her anxiety had been well-controlled until this morning, she had a full-fledged panic attack; hands were sweating, very nauseated, just out of the blue, and this has happened to her before when anxious or stressed; thought that was a little off; woke her up this morning with that; head was hurting  She has been taking tylenol; she is concerned about a bleed on the brain; she took two ibuprofen the day that it happened, not even thinking that it might have consequences taking a blood thinner; a friend of hers who had a concussion told her to not take any more NSAIDs and she's just taken tylenol since then  She was using the flexeril for tension headaches; it is helpful when she uses it She does not take the bentyl because of side effects She had bilateral pneumonia in April; Cheryl  sent her over to pulmonologist at [facility]; she does not take the advair regularly; she just ; used while sick  Relevant past medical, surgical, family and social history reviewed and updated as indicated  Interim medical history since our last visit reviewed. Bilateral pneumonia in April; saw pulmonologist  She gets pap smears through OB-GYN, last was July 2016 She did get a flu shot this year at [facility]; probably late October 2016  Allergies and medications reviewed and updated.  Review of Systems  Per HPI unless specifically indicated above     Objective:    BP 115/79 mmHg  Pulse 96  Temp(Src) 98.8 F (37.1 C)  Ht 5\' 7"  (1.702 m)  Wt 211 lb 3.2 oz (95.8 kg)  BMI 33.07 kg/m2  SpO2 98%  LMP 12/10/2015 (Exact Date)  Wt Readings from Last 3 Encounters:  12/21/15 211 lb 3.2 oz (95.8 kg)  10/30/15 209 lb 9.6 oz (95.074 kg)  09/12/15 205 lb (92.987 kg)    Physical Exam  Constitutional: She appears well-developed and well-nourished.  HENT:  Head: Normocephalic. Head is with contusion (left side forehead near hairline, visible bruise and knot about 50-cent piece sized, skin is not broken).  Right Ear: Hearing, tympanic membrane, external ear and ear canal normal. No  hemotympanum.  Left Ear: Hearing, tympanic membrane, external ear and ear canal normal. No hemotympanum.  Nose: No rhinorrhea.  Mouth/Throat: Uvula is midline and oropharynx is clear and moist. Mucous membranes are not dry.  Eyes: EOM are normal. Pupils are equal, round, and reactive to light. Right eye exhibits no discharge. Left eye exhibits no discharge. No scleral icterus.  Neck: Neck supple. No JVD present. No thyromegaly present.  Cardiovascular: Normal rate and regular rhythm.   Pulmonary/Chest: Effort normal and breath sounds normal.  Musculoskeletal: She exhibits no edema.  Neurological: She displays no tremor and normal reflexes. No cranial nerve deficit. She exhibits normal muscle tone. Coordination and gait  normal.  Reflex Scores:      Patellar reflexes are 2+ on the right side and 2+ on the left side. No dysdiadochokinesis, normal heel-to-shin, normal finger-to-nose testing  Skin: Skin is warm.  Psychiatric: She has a normal mood and affect.      Assessment & Plan:   Problem List Items Addressed This Visit      Respiratory   Asthma    Discussed the risk of "asthma-related death" with one component of the Advair; that medicine is not meant to be used PRN; if she needs something above and beyond her rescue inhaler, she should talk to her primary or her pulmonologist about plain corticosteroid which would not have that same risk        Digestive   IBS (irritable bowel syndrome)    Patient no longer using bentyl regularly because of side effects      Relevant Medications   Probiotic Product (SOLUBLE FIBER/PROBIOTICS PO)     Other   Headache - Primary    With abrupt onset of nausea this morning; while unlikely, will get head CT to rule-out acute bleed, SDH; avoid NSAIDs, aspirin; may use plain tylenol for pain      Relevant Orders   CT Head Wo Contrast    Other Visit Diagnoses    Nausea        associated with panic symptoms, but following closed head injury; head CT ordered    Relevant Orders    CT Head Wo Contrast    Photophobia        associated with post-traumatic headache, closed head injury; getting stat head CT to r/o SDH    Relevant Orders    CT Head Wo Contrast    Closed head injury, initial encounter        no LOC, no confusion, but moderate pain and nausea and photophobia; getting stat head CT; patient to wait at hospital until results relayed to her       Follow up plan: Return if symptoms worsen or fail to improve.   Orders Placed This Encounter  Procedures  . CT Head Wo Contrast

## 2015-12-21 NOTE — Assessment & Plan Note (Addendum)
With abrupt onset of nausea this morning; while unlikely, will get head CT to rule-out acute bleed, SDH; avoid NSAIDs, aspirin; may use plain tylenol for pain

## 2015-12-21 NOTE — Patient Instructions (Signed)
Please have the scan done today and talk with me or staff before you leave the hospital Brain rest for a little while If symptoms worsen, then go to the ER You can return for the pneumonia vaccine at your leisure later on when feeling better (Pneumovax, PPSV-23) If you need to start a daily preventive medicine inhaler for asthma, consider the plain corticosteroid instead of Advair, working with your pulmonologist

## 2016-01-11 ENCOUNTER — Telehealth: Payer: Self-pay

## 2016-01-11 DIAGNOSIS — N390 Urinary tract infection, site not specified: Secondary | ICD-10-CM

## 2016-01-11 NOTE — Telephone Encounter (Signed)
Left message to call.

## 2016-01-11 NOTE — Telephone Encounter (Signed)
Patient notified, she waited over an hour on [facility] for [facility] and they finally called her back.

## 2016-01-11 NOTE — Telephone Encounter (Signed)
She wants to know since she has a lot of UTIs if you think she should go to see a urologist?

## 2016-01-11 NOTE — Telephone Encounter (Signed)
Patient called and stated that [facility] and was diagnosed with a bladder and kidney infection. They gave her Cipro  BID x 10 days. She states the pain with urination has improved, but she still has a lot of back pain. She has tried to call them back and they will not answer. She wants to know if there is anything else she should be doing or does she need to give it more time.

## 2016-01-11 NOTE — Telephone Encounter (Signed)
She should not still be having that much back pain; she should call them back or return to be evaluated; could be pyelonephritis, kidney stone, other condition; I cannot warrant a guess without knowing what her urine showed; encourage her to get checked out ASAP

## 2016-01-12 DIAGNOSIS — N390 Urinary tract infection, site not specified: Secondary | ICD-10-CM | POA: Insufficient documentation

## 2016-01-12 NOTE — Telephone Encounter (Signed)
I spoke with patient on Saturday; she says she had symptoms over a week ago but family needs have necessitate her attention and she put her health on hold She did not get it taken care of sooner, but thinks she has 4-5 of these over the last 12 months I'll be very glad to get her to a urologist; referral entered; call if she hasn't heard back by [facility]

## 2016-01-15 ENCOUNTER — Ambulatory Visit (INDEPENDENT_AMBULATORY_CARE_PROVIDER_SITE_OTHER): Payer: 59 | Admitting: Urology

## 2016-01-15 ENCOUNTER — Encounter: Payer: Self-pay | Admitting: Urology

## 2016-01-15 VITALS — BP 133/79 | HR 106 | Ht 67.0 in | Wt 210.0 lb

## 2016-01-15 DIAGNOSIS — N390 Urinary tract infection, site not specified: Secondary | ICD-10-CM | POA: Diagnosis not present

## 2016-01-15 DIAGNOSIS — R109 Unspecified abdominal pain: Secondary | ICD-10-CM | POA: Diagnosis not present

## 2016-01-15 LAB — MICROSCOPIC EXAMINATION
Epithelial Cells (non renal): >10 /hpf — ABNORMAL HIGH (ref 0–10)
RBC MICROSCOPIC, UA: NONE SEEN /HPF (ref 0–?)

## 2016-01-15 LAB — URINALYSIS, COMPLETE
BILIRUBIN UA: NEGATIVE
GLUCOSE, UA: NEGATIVE
NITRITE UA: NEGATIVE
RBC UA: NEGATIVE
Urobilinogen, Ur: 0.2 mg/dL (ref 0.2–1.0)
pH, UA: 6 (ref 5.0–7.5)

## 2016-01-15 LAB — BLADDER SCAN AMB NON-IMAGING: Scan Result: 13

## 2016-01-15 MED ORDER — FLUCONAZOLE 150 MG PO TABS: 150.0000 mg | ORAL_TABLET | Freq: Once | ORAL | Status: DC

## 2016-01-15 NOTE — Progress Notes (Signed)
01/15/2016 3:02 PM   Deborah Fields [birth year] [id number]  Referring provider: Kerman Passey, MD [address] [place], [place] [address]  Chief Complaint  Patient presents with  . Recurrent UTI    referred by Dr. Baruch Gouty    HPI: Patient is a 30 year old Caucasian female with a history of recurrent urinary tract infections referred by her primary care physician, Dr. Sherie Don, for further evaluation and management.    She says that she has had approximately 5-6 urinary tract infections over the last year.  Her last urinary tract infection was diagnosed on 01/08/2016 through an urgent care. She was placed on Cipro 500 mg twice daily.  She was not feeling any improvement in her urinary symptoms 5 days to the treatment, so she contacted the urgent care facility. They stated that urine cultures noted that the organism was sensitive to the Cipro to continue the medication and follow-up with [place].  She does not know the name of the organism. I do not have those records available to me at this visit.    She states her symptoms with urinary tract infections consist of frequency, urgency, dysuria and nocturia. Currently, she is experiencing left CVA tenderness. She has been told that she's had bloody urine, but she has not seen it herself.  Reviewing her past urine specimens from her primary care physician's office, they have been urinary dips and no microscopic analysis.    She does not have a history of nephrolithiasis. She does suffer with IBS and has alternating diarrhea and constipation. She has not noticed any correlation with these infections with sexual activity or use of tampons.  She also engages in good hygiene white being front to back. She urinates before and after intercourse. She does not take tub baths.  Her UA today demonstrates 11-30 WBC's per high-power field and many bacteria per high prior field.  She has not experienced any fever, chills, vomiting, but she does admit to  nausea.   PMH: Past Medical History  Diagnosis Date  . MRSA (methicillin resistant Staphylococcus aureus)   . Anxiety     managed by Psych  . Depression     managed by psych  . Asthma   . Allergic rhinitis   . Panic disorder without agoraphobia     managed by Psych  . IBS (irritable bowel syndrome)     per colonoscopy in Feb 2015  . Hiatal hernia     shown on colonoscopy  . Erosive esophagitis     seen on Colonoscopy  . Heartburn     Surgical History: Past Surgical History  Procedure Laterality Date  . Cesarean section    . Tonsillectomy    . Ovarian cyst removal Right   . Colonoscopy  Feb 2015    showed IBS, Hiatal Hernia, Erosive reflux esophagitis  . Esophagogastroduodenoscopy      Home Medications:    Medication List       This list is accurate as of: 01/15/16  3:02 PM.  Always use your most recent med list.               ciprofloxacin 500 MG tablet  Commonly known as:  CIPRO  TAKE 1 TABLET (500 MG TOTAL) BY MOUTH EVERY 12 (TWELVE) HOURS FOR 10 DAYS.     clonazePAM 0.5 MG tablet  Commonly known as:  KLONOPIN  Take 0.5 mg by mouth 2 (two) times daily as needed.     cyclobenzaprine 10 MG tablet  Commonly known as:  FLEXERIL  Take 10 mg by mouth every 8 (eight) hours as needed for muscle spasms. Reported on 01/15/2016     dexlansoprazole 60 MG capsule  Commonly known as:  DEXILANT  Take 60 mg by mouth daily.     fluconazole 150 MG tablet  Commonly known as:  DIFLUCAN  Take 1 tablet (150 mg total) by mouth once.     fluticasone 50 MCG/ACT nasal spray  Commonly known as:  FLONASE  Place 2 sprays into both nostrils daily.     JUNEL FE 1/20 1-20 MG-MCG tablet  Generic drug:  norethindrone-ethinyl estradiol  Reported on 12/21/2015     PROAIR HFA 108 (90 Base) MCG/ACT inhaler  Generic drug:  albuterol  Inhale 2 puffs into the lungs every 6 (six) hours as needed. Reported on 01/15/2016     promethazine 25 MG tablet  Commonly known as:  PHENERGAN   TAKE 1 TABLET BY MOUTH EVERY 6 HOURS AS NEEDED FOR NAUSEA OR VOMITING     sertraline 100 MG tablet  Commonly known as:  ZOLOFT  Take 200 mg by mouth daily.     SOLUBLE FIBER/PROBIOTICS PO  Take by mouth.        Allergies:  Allergies  Allergen Reactions  . Sulfa Antibiotics Rash    Family History: Family History  Problem Relation Age of Onset  . Diabetes Mother   . Hypertension Mother   . Hyperlipidemia Mother   . Heart disease Father   . Hypertension Father   . Hyperlipidemia Father   . COPD Maternal Grandmother   . COPD Maternal Grandfather   . Cancer Paternal Grandfather     prostate  . Stroke Neg Hx   . Kidney disease Neg Hx   . Bladder Cancer Neg Hx     Social History:  reports that she has quit smoking. Her smoking use included Cigarettes. She has a 2.5 pack-year smoking history. She has never used smokeless tobacco. She reports that she does not drink alcohol or use illicit drugs.  ROS: UROLOGY Frequent Urination?: Yes Hard to postpone urination?: Yes Burning/pain with urination?: Yes Get up at night to urinate?: Yes Leakage of urine?: No Urine stream starts and stops?: No Trouble starting stream?: No Do you have to strain to urinate?: No Blood in urine?: No Urinary tract infection?: Yes Sexually transmitted disease?: No Injury to kidneys or bladder?: No Painful intercourse?: No Weak stream?: No Currently pregnant?: No Vaginal bleeding?: No Last menstrual period?: n  Gastrointestinal Nausea?: Yes Vomiting?: No Indigestion/heartburn?: Yes Diarrhea?: Yes Constipation?: No  Constitutional Fever: No Night sweats?: No Weight loss?: No Fatigue?: Yes  Skin Skin rash/lesions?: No Itching?: No  Eyes Blurred vision?: No Double vision?: No  Ears/Nose/Throat Sore throat?: No Sinus problems?: Yes  Hematologic/Lymphatic Swollen glands?: No Easy bruising?: No  Cardiovascular Leg swelling?: No Chest pain?: No  Respiratory Cough?:  Yes Shortness of breath?: No  Endocrine Excessive thirst?: No  Musculoskeletal Back pain?: Yes Joint pain?: No  Neurological Headaches?: Yes Dizziness?: No  Psychologic Depression?: Yes Anxiety?: Yes  Physical Exam: BP 133/79 mmHg  Pulse 106  Ht  (1.702 m)  Wt 210 lb (95.255 kg)  BMI 32.88 kg/m2  LMP 12/22/2015  Constitutional: Well nourished. Alert and oriented, No acute distress. HEENT: [place] AT, moist mucus membranes. Trachea midline, no masses. Cardiovascular: No clubbing, cyanosis, or edema. Respiratory: Normal respiratory effort, no increased work of breathing. GI: Abdomen is soft, non tender, non distended, no abdominal masses.  Liver and spleen not palpable.  No hernias appreciated.  Stool sample for occult testing is not indicated.   GU: Left CVA tenderness is noted.  No right CVA tenderness is noted.  No bladder fullness or masses.  Normal external genitalia, normal pubic hair distribution, no lesions.  Normal urethral meatus, no lesions, no prolapse, no discharge.   No urethral masses, tenderness and/or tenderness. No bladder fullness, tenderness or masses. Normal vagina mucosa, good estrogen effect, no discharge, no lesions, good pelvic support, no cystocele or rectocele noted.  No cervical motion tenderness.  Uterus is freely mobile and non-fixed.  No adnexal/parametria masses or tenderness noted.  Anus and perineum are without rashes or lesions.    Skin: No rashes, bruises or suspicious lesions. Lymph: No cervical or inguinal adenopathy. Neurologic: Grossly intact, no focal deficits, moving all 4 extremities. Psychiatric: Normal mood and affect.  Laboratory Data: Lab Results  Component Value Date   WBC 6.1 08/16/2013   HGB 14.0 08/16/2013   HCT 38.8 08/16/2013   MCV 89 08/16/2013   PLT 320 08/16/2013    Lab Results  Component Value Date   CREATININE 0.57* 12/19/2012    Lab Results  Component Value Date   TSH 0.774 12/19/2012    Lab Results   Component Value Date   AST 21 12/19/2012   Lab Results  Component Value Date   ALT 26 12/19/2012    Urinalysis Results for orders placed or performed in visit on 01/15/16  Microscopic Examination  Result Value Ref Range   WBC, UA 11-30 (A) 0 -  5 /hpf   RBC, UA None seen 0 -  2 /hpf   Epithelial Cells (non renal) >10 (H) 0 - 10 /hpf   Mucus, UA Present (A) Not Estab.   Bacteria, UA Many (A) None seen/Few  Urinalysis, Complete  Result Value Ref Range   Specific Gravity, UA >1.030 (H) 1.005 - 1.030   pH, UA 6.0 5.0 - 7.5   Color, UA Yellow Yellow   Appearance Ur Cloudy (A) Clear   Leukocytes, UA 1+ (A) Negative   Protein, UA 1+ (A) Negative/Trace   Glucose, UA Negative Negative   Ketones, UA Trace (A) Negative   RBC, UA Negative Negative   Bilirubin, UA Negative Negative   Urobilinogen, Ur 0.2 0.2 - 1.0 mg/dL   Nitrite, UA Negative Negative   Microscopic Examination See below:      Pertinent Imaging: Results for Deborah, Fields (MRN [id number]) as of 01/15/2016 14:38  Ref. Range 01/15/2016 14:25  Scan Result Unknown 13    Assessment & Plan:    1. Recurrent UTI:   Patient has a history of being treated for 5-6 urinary tract infections over the last year.  I do not have any urine culture results available to me at this appointment. We will contact her primary care physician's office and the urgent care clinic for these records.  Her UA is somewhat suspicious for infection today and she is not improving with a Cipro, so I will send it for culture.  - Urinalysis, Complete - CULTURE, URINE COMPREHENSIVE - BLADDER SCAN AMB NON-IMAGING  2. Left flank pain:   Patient is still experiencing left flank pain with left CVA tenderness on exam.  I will obtain a CT Renal Stone study to evaluate for a possible ureteral or kidney stone.    Return in about 1 week (around 01/22/2016) for CT scan report.  These notes generated with voice recognition software. I apologize for  typographical errors.  Zara Council, [facility] [address] [place], [place] [address] [phone]

## 2016-01-16 ENCOUNTER — Encounter: Payer: Self-pay | Admitting: Family Medicine

## 2016-01-16 DIAGNOSIS — R109 Unspecified abdominal pain: Secondary | ICD-10-CM | POA: Insufficient documentation

## 2016-01-16 DIAGNOSIS — N390 Urinary tract infection, site not specified: Secondary | ICD-10-CM | POA: Insufficient documentation

## 2016-01-16 LAB — HCG, SERUM, QUALITATIVE: hCG,Beta Subunit,Qual,Serum: NEGATIVE m[IU]/mL (ref <6)

## 2016-01-17 ENCOUNTER — Other Ambulatory Visit: Payer: 59

## 2016-01-18 ENCOUNTER — Ambulatory Visit
Admission: RE | Admit: 2016-01-18 | Discharge: 2016-01-18 | Disposition: A | Discharge status: Home / Self Care | Payer: 59 | Source: Ambulatory Visit | Attending: Urology | Admitting: Urology

## 2016-01-18 ENCOUNTER — Telehealth: Payer: Self-pay

## 2016-01-18 ENCOUNTER — Other Ambulatory Visit: Payer: Self-pay | Admitting: Urology

## 2016-01-18 DIAGNOSIS — N390 Urinary tract infection, site not specified: Secondary | ICD-10-CM | POA: Insufficient documentation

## 2016-01-18 DIAGNOSIS — R110 Nausea: Secondary | ICD-10-CM | POA: Insufficient documentation

## 2016-01-18 DIAGNOSIS — R109 Unspecified abdominal pain: Secondary | ICD-10-CM

## 2016-01-18 DIAGNOSIS — N3000 Acute cystitis without hematuria: Secondary | ICD-10-CM

## 2016-01-18 LAB — CULTURE, URINE COMPREHENSIVE

## 2016-01-18 MED ORDER — NITROFURANTOIN MONOHYD MACRO 100 MG PO CAPS: 100.0000 mg | ORAL_CAPSULE | Freq: Two times a day (BID) | ORAL | Status: DC

## 2016-01-18 NOTE — Telephone Encounter (Signed)
-----   Message from Harle Battiest, PA-C sent at 01/18/2016  9:49 AM EST ----- I have notified the patient of her urine culture results. I have sent a prescription for Macrobid into her pharmacy. Her CT scan is pending. It is scheduled for this afternoon.

## 2016-01-21 ENCOUNTER — Telehealth: Payer: Self-pay

## 2016-01-21 NOTE — Telephone Encounter (Signed)
-----   Message from Harle Battiest, PA-C sent at 01/19/2016  7:44 PM EST ----- I spoken to the patient concerning her CT scan results.

## 2016-01-25 ENCOUNTER — Ambulatory Visit: Payer: 59 | Admitting: Urology

## 2016-01-28 ENCOUNTER — Ambulatory Visit (INDEPENDENT_AMBULATORY_CARE_PROVIDER_SITE_OTHER): Payer: 59 | Admitting: Urology

## 2016-01-28 ENCOUNTER — Encounter: Payer: Self-pay | Admitting: Urology

## 2016-01-28 VITALS — BP 129/86 | HR 122 | Ht 67.0 in | Wt 207.8 lb

## 2016-01-28 DIAGNOSIS — R109 Unspecified abdominal pain: Secondary | ICD-10-CM | POA: Diagnosis not present

## 2016-01-28 DIAGNOSIS — R300 Dysuria: Secondary | ICD-10-CM | POA: Diagnosis not present

## 2016-01-28 DIAGNOSIS — N390 Urinary tract infection, site not specified: Secondary | ICD-10-CM | POA: Diagnosis not present

## 2016-01-28 NOTE — Progress Notes (Signed)
2:25 PM   Deborah Fields [facility] [birth year] [id number]  Referring provider: Kerman Passey, MD [address] [place], [place] [address]  Chief Complaint  Patient presents with  . Results    Ctscan    HPI: Patient is a 30 year old Caucasian female with a history of recurrent urinary tract infections presents today for a discussion concerning the results of her CT Renal Stone study.  Background story She says that she has had approximately 5-6 urinary tract infections over the last year.  Her last urinary tract infection was diagnosed on 01/08/2016 through an urgent care. She was placed on Cipro 500 mg twice daily.  She was not feeling any improvement in her urinary symptoms 5 days to the treatment, so she contacted the urgent care facility. They stated that urine cultures noted that the organism was sensitive to the Cipro to continue the medication and follow-up with [place].  She does not know the name of the organism. I do not have those records available to me at this visit.  She states her symptoms with urinary tract infections consist of frequency, urgency, dysuria and nocturia.  She was experiencing left CVA tenderness. She has been told that she's had blood in her urine, but she has not seen it herself.  Reviewing her past urine specimens from her primary care physician's office, they have been urinary dips and no microscopic analysis.  She does not have a history of nephrolithiasis. She does suffer with IBS and has alternating diarrhea and constipation. She has not noticed any correlation with these infections with sexual activity or use of tampons.  She also engages in good hygiene white being front to back. She urinates before and after intercourse, but has not had any intercourse for several weeks because her husband is in a rehabilitation center.  She does not take tub baths.  Her urine culture from 01/15/2016 was positive for Viridans streptococcus group which was resistant to the Cipro she was on,  so she was changed to Macrobid.  She is no longer having left flank pain, but she is still experiencing dysuria.  Her CT Renal Stone study from 01/18/2016 did not demonstrate any evidence of ureterolithiasis, hydronephrosis or other acute findings.  The films were reviewed with the patient.    Her UA today was positive for 6-10 WBC's/hpf and moderate bacteria.  She denies any gross hematuria or suprapubic pain.  She also has not had fevers, chills, nausea or vomiting.    PMH: Past Medical History  Diagnosis Date  . MRSA (methicillin resistant Staphylococcus aureus)   . Anxiety     managed by Psych  . Depression     managed by psych  . Asthma   . Allergic rhinitis   . Panic disorder without agoraphobia     managed by Psych  . IBS (irritable bowel syndrome)     per colonoscopy in Feb 2015  . Hiatal hernia     shown on colonoscopy  . Erosive esophagitis     seen on Colonoscopy  . Heartburn     Surgical History: Past Surgical History  Procedure Laterality Date  . Cesarean section    . Tonsillectomy    . Ovarian cyst removal Right   . Colonoscopy  Feb 2015    showed IBS, Hiatal Hernia, Erosive reflux esophagitis  . Esophagogastroduodenoscopy      Home Medications:    Medication List       This list is accurate as of: 01/28/16  2:25 PM.  Always use your most recent med list.               clonazePAM 0.5 MG tablet  Commonly known as:  KLONOPIN  Take 0.5 mg by mouth 2 (two) times daily as needed.     cyclobenzaprine 10 MG tablet  Commonly known as:  FLEXERIL  Take 10 mg by mouth every 8 (eight) hours as needed for muscle spasms. Reported on 01/28/2016     dexlansoprazole 60 MG capsule  Commonly known as:  DEXILANT  Take 60 mg by mouth daily.     fluticasone 50 MCG/ACT nasal spray  Commonly known as:  FLONASE  Place 2 sprays into both nostrils daily.     JUNEL FE 1/20 1-20 MG-MCG tablet  Generic drug:  norethindrone-ethinyl estradiol  Reported on 12/21/2015      PROAIR HFA 108 (90 Base) MCG/ACT inhaler  Generic drug:  albuterol  Inhale 2 puffs into the lungs every 6 (six) hours as needed. Reported on 01/15/2016     promethazine 25 MG tablet  Commonly known as:  PHENERGAN  TAKE 1 TABLET BY MOUTH EVERY 6 HOURS AS NEEDED FOR NAUSEA OR VOMITING     sertraline 100 MG tablet  Commonly known as:  ZOLOFT  Take 200 mg by mouth daily.     SOLUBLE FIBER/PROBIOTICS PO  Take by mouth.        Allergies:  Allergies  Allergen Reactions  . Sulfa Antibiotics Rash    Family History: Family History  Problem Relation Age of Onset  . Diabetes Mother   . Hypertension Mother   . Hyperlipidemia Mother   . Heart disease Father   . Hypertension Father   . Hyperlipidemia Father   . COPD Maternal Grandmother   . COPD Maternal Grandfather   . Cancer Paternal Grandfather     prostate  . Stroke Neg Hx   . Kidney disease Neg Hx   . Bladder Cancer Neg Hx     Social History:  reports that she has quit smoking. Her smoking use included Cigarettes. She has a 2.5 pack-year smoking history. She has never used smokeless tobacco. She reports that she does not drink alcohol or use illicit drugs.  ROS: UROLOGY Frequent Urination?: Yes Hard to postpone urination?: No Burning/pain with urination?: Yes Get up at night to urinate?: No Leakage of urine?: No Urine stream starts and stops?: No Trouble starting stream?: No Do you have to strain to urinate?: No Blood in urine?: No Urinary tract infection?: No Sexually transmitted disease?: No Injury to kidneys or bladder?: No Painful intercourse?: No Weak stream?: No Currently pregnant?: No Vaginal bleeding?: No Last menstrual period?: n  Gastrointestinal Nausea?: No Vomiting?: No Indigestion/heartburn?: No Diarrhea?: No Constipation?: No  Constitutional Fever: No Night sweats?: No Weight loss?: No Fatigue?: No  Skin Skin rash/lesions?: No Itching?: No  Eyes Blurred vision?: No Double vision?:  No  Ears/Nose/Throat Sore throat?: No Sinus problems?: No  Hematologic/Lymphatic Swollen glands?: No Easy bruising?: No  Cardiovascular Leg swelling?: No Chest pain?: No  Respiratory Cough?: No Shortness of breath?: No  Endocrine Excessive thirst?: No  Musculoskeletal Back pain?: No Joint pain?: No  Neurological Headaches?: No Dizziness?: No  Psychologic Depression?: Yes Anxiety?: Yes  Physical Exam: BP 129/86 mmHg  Pulse 122  Ht 5\' 7"  (1.702 m)  Wt 207 lb 12.8 oz (94.257 kg)  BMI 32.54 kg/m2  LMP 12/22/2015  Constitutional: Well nourished. Alert and oriented, No acute distress. HEENT: [place] AT,  moist mucus membranes. Trachea midline, no masses. Cardiovascular: No clubbing, cyanosis, or edema. Respiratory: Normal respiratory effort, no increased work of breathing. Skin: No rashes, bruises or suspicious lesions. Lymph: No cervical or inguinal adenopathy. Neurologic: Grossly intact, no focal deficits, moving all 4 extremities. Psychiatric: Normal mood and affect.  Laboratory Data: Lab Results  Component Value Date   WBC 6.1 08/16/2013   HGB 14.0 08/16/2013   HCT 38.8 08/16/2013   MCV 89 08/16/2013   PLT 320 08/16/2013    Lab Results  Component Value Date   CREATININE 0.57* 12/19/2012    Lab Results  Component Value Date   TSH 0.774 12/19/2012    Lab Results  Component Value Date   AST 21 12/19/2012   Lab Results  Component Value Date   ALT 26 12/19/2012    Urinalysis Results for orders placed or performed in visit on 01/28/16  Microscopic Examination  Result Value Ref Range   WBC, UA 6-10 (A) 0 -  5 /hpf   RBC, UA 0-2 0 -  2 /hpf   Epithelial Cells (non renal) >10 (A) 0 - 10 /hpf   Mucus, UA Present (A) Not Estab.   Bacteria, UA Moderate (A) None seen/Few  Urinalysis, Complete  Result Value Ref Range   Specific Gravity, UA >1.030 (H) 1.005 - 1.030   pH, UA 6.0 5.0 - 7.5   Color, UA Yellow Yellow   Appearance Ur Cloudy (A) Clear     Leukocytes, UA Trace (A) Negative   Protein, UA 1+ (A) Negative/Trace   Glucose, UA Negative Negative   Ketones, UA Trace (A) Negative   RBC, UA Negative Negative   Bilirubin, UA Negative Negative   Urobilinogen, Ur 0.2 0.2 - 1.0 mg/dL   Nitrite, UA Negative Negative   Microscopic Examination See below:      Pertinent Imaging: CLINICAL DATA: Left flank pain for 2 weeks. Nausea. Urinary tract infection  EXAM: CT ABDOMEN AND PELVIS WITHOUT CONTRAST  TECHNIQUE: Multidetector CT imaging of the abdomen and pelvis was performed following the standard protocol without IV contrast.  COMPARISON: 08/02/2013  FINDINGS: Lower chest: No acute findings.  Hepatobiliary: No mass visualized on this un-enhanced exam. Gallbladder is unremarkable  Pancreas: No mass or inflammatory process identified on this un-enhanced exam.  Spleen: Within normal limits in size.  Adrenals/Urinary Tract: No evidence of urolithiasis or hydronephrosis. No definite mass visualized on this un-enhanced exam.  Stomach/Bowel: No evidence of obstruction, inflammatory process, or abnormal fluid collections. Appendix is normal.  Vascular/Lymphatic: No pathologically enlarged lymph nodes. No evidence of abdominal aortic aneurysm.  Reproductive: No mass or other significant abnormality.  Other: None.  Musculoskeletal: No suspicious bone lesions identified.  IMPRESSION: No evidence of urolithiasis, hydronephrosis, or other acute findings.   Electronically Signed  By: Myles Rosenthal M.D.  On: 01/18/2016 14:45       Results for Lasorsa, Deborah Fields (MRN [id number]) as of 01/15/2016 14:38  Ref. Range 01/15/2016 14:25  Scan Result Unknown 13    Assessment & Plan:     1. Recurrent UTI:   Patient has a history of being treated for 5-6 urinary tract infections over the last year.  Her last infection was with [place] and has been treated with the appropriate antibiotic.  She is still  experiencing dysuria.  I will send her urine for culture.    - Urinalysis, Complete - CULTURE, URINE COMPREHENSIVE   2. Left flank pain:   Patient CT Renal Stone study did not demonstrate any  nephrolithiasis or hydronephrosis.    3. Dysuria:   Patient continues with dysuria.  If her urine culture is negative, we will pursue a cystoscopy to rule out CIS.    She may have IC, but this is a diagnosis of exclusion.    Return for cystoscopy.  These notes generated with voice recognition software. I apologize for typographical errors.  Michiel Cowboy, PA-C  [facility] [address], [address] [place], [place] [address] [phone]

## 2016-01-29 DIAGNOSIS — R300 Dysuria: Secondary | ICD-10-CM | POA: Insufficient documentation

## 2016-01-29 LAB — MICROSCOPIC EXAMINATION

## 2016-01-29 LAB — URINALYSIS, COMPLETE
BILIRUBIN UA: NEGATIVE
GLUCOSE, UA: NEGATIVE
NITRITE UA: NEGATIVE
RBC UA: NEGATIVE
UUROB: 0.2 mg/dL (ref 0.2–1.0)
pH, UA: 6 (ref 5.0–7.5)

## 2016-01-30 LAB — CULTURE, URINE COMPREHENSIVE

## 2016-01-31 ENCOUNTER — Telehealth: Payer: Self-pay

## 2016-01-31 NOTE — Telephone Encounter (Signed)
Spoke with pt in reference to -ucx. Pt voiced understanding.  

## 2016-01-31 NOTE — Telephone Encounter (Signed)
-----   Message from Harle Battiest, PA-C sent at 01/31/2016 11:35 AM EST ----- Patient's urine culture was normal urogenital flora.

## 2016-02-07 ENCOUNTER — Other Ambulatory Visit: Payer: 59 | Admitting: Urology

## 2016-02-14 ENCOUNTER — Ambulatory Visit (INDEPENDENT_AMBULATORY_CARE_PROVIDER_SITE_OTHER): Payer: 59 | Admitting: Urology

## 2016-02-14 ENCOUNTER — Encounter: Payer: Self-pay | Admitting: Urology

## 2016-02-14 VITALS — BP 121/79 | HR 91 | Ht 67.0 in | Wt 208.7 lb

## 2016-02-14 DIAGNOSIS — N390 Urinary tract infection, site not specified: Secondary | ICD-10-CM | POA: Diagnosis not present

## 2016-02-14 LAB — URINALYSIS, COMPLETE
Bilirubin, UA: NEGATIVE
GLUCOSE, UA: NEGATIVE
Ketones, UA: NEGATIVE
Nitrite, UA: NEGATIVE
PH UA: 6 (ref 5.0–7.5)
PROTEIN UA: NEGATIVE
RBC, UA: NEGATIVE
Specific Gravity, UA: 1.025 (ref 1.005–1.030)
Urobilinogen, Ur: 0.2 mg/dL (ref 0.2–1.0)

## 2016-02-14 LAB — MICROSCOPIC EXAMINATION: Epithelial Cells (non renal): >10 /hpf — AB (ref 0–10)

## 2016-02-14 MED ORDER — URIBEL 118 MG PO CAPS: 1.0000 | ORAL_CAPSULE | Freq: Four times a day (QID) | ORAL | Status: DC

## 2016-02-14 NOTE — Progress Notes (Signed)
02/14/2016 10:38 AM   Deborah Fields 07-Dec-1986 [id number]  Referring provider: Kerman Passey, MD [address] [place], [place] [address]  Chief Complaint  Patient presents with  . Cysto    HPI: HPI: Patient is a 30 year old Caucasian female with a history of recurrent urinary tract infections presents today for a discussion concerning the results of her CT Renal Stone study.  Background story She says that she has had approximately 5-6 urinary tract infections over the last year. Her last urinary tract infection was diagnosed on 01/08/2016 through an urgent care. She was placed on Cipro 500 mg twice daily. She was not feeling any improvement in her urinary symptoms 5 days to the treatment, so she contacted the urgent care facility. They stated that urine cultures noted that the organism was sensitive to the Cipro to continue the medication and follow-up with [place]. She does not know the name of the organism. I do not have those records available to me at this visit. She states her symptoms with urinary tract infections consist of frequency, urgency, dysuria and nocturia. She was experiencing left CVA tenderness. She has been told that she's had blood in her urine, but she has not seen it herself. Reviewing her past urine specimens from her primary care physician's office, they have been urinary dips and no microscopic analysis. She does not have a history of nephrolithiasis. She does suffer with IBS and has alternating diarrhea and constipation. She has not noticed any correlation with these infections with sexual activity or use of tampons. She also engages in good hygiene white being front to back. She urinates before and after intercourse, but has not had any intercourse for several weeks because her husband is in a rehabilitation center. She does not take tub baths.  Her urine culture from 01/15/2016 was positive for Viridans streptococcus group which was resistant to the Cipro she was  on, so she was changed to Macrobid. She is no longer having left flank pain, but she is still experiencing dysuria.  Her CT Renal Stone study from 01/18/2016 did not demonstrate any evidence of ureterolithiasis, hydronephrosis or other acute findings. The films were reviewed with the patient.   Her UA today was positive for 6-10 WBC's/hpf and moderate bacteria. She denies any gross hematuria or suprapubic pain. She also has not had fevers, chills, nausea or vomiting.   March 2017 Interval History: Patient presents today for cystoscopy which was negative. The patient continues to have dysuria without a positive urine culture. She denies pelvic pain outside of during voiding.  PMH: Past Medical History  Diagnosis Date  . MRSA (methicillin resistant Staphylococcus aureus)   . Anxiety     managed by Psych  . Depression     managed by psych  . Asthma   . Allergic rhinitis   . Panic disorder without agoraphobia     managed by Psych  . IBS (irritable bowel syndrome)     per colonoscopy in Feb 2015  . Hiatal hernia     shown on colonoscopy  . Erosive esophagitis     seen on Colonoscopy  . Heartburn     Surgical History: Past Surgical History  Procedure Laterality Date  . Cesarean section    . Tonsillectomy    . Ovarian cyst removal Right   . Colonoscopy  Feb 2015    showed IBS, Hiatal Hernia, Erosive reflux esophagitis  . Esophagogastroduodenoscopy      Home Medications:    Medication List  This list is accurate as of: 02/14/16 10:38 AM.  Always use your most recent med list.               clonazePAM 0.5 MG tablet  Commonly known as:  KLONOPIN  Take 0.5 mg by mouth 2 (two) times daily as needed.     cyclobenzaprine 10 MG tablet  Commonly known as:  FLEXERIL  Take 10 mg by mouth every 8 (eight) hours as needed for muscle spasms. Reported on 02/14/2016     dexlansoprazole 60 MG capsule  Commonly known as:  DEXILANT  Take 60 mg by mouth daily.      fluticasone 50 MCG/ACT nasal spray  Commonly known as:  FLONASE  Place 2 sprays into both nostrils daily.     JUNEL FE 1/20 1-20 MG-MCG tablet  Generic drug:  norethindrone-ethinyl estradiol  Reported on 12/21/2015     PROAIR HFA 108 (90 Base) MCG/ACT inhaler  Generic drug:  albuterol  Inhale 2 puffs into the lungs every 6 (six) hours as needed. Reported on 02/14/2016     promethazine 25 MG tablet  Commonly known as:  PHENERGAN  TAKE 1 TABLET BY MOUTH EVERY 6 HOURS AS NEEDED FOR NAUSEA OR VOMITING     sertraline 100 MG tablet  Commonly known as:  ZOLOFT  Take 200 mg by mouth daily.     SOLUBLE FIBER/PROBIOTICS PO  Take by mouth.     URIBEL 118 MG Caps  Take 1 capsule (118 mg total) by mouth QID.        Allergies:  Allergies  Allergen Reactions  . Sulfa Antibiotics Rash    Family History: Family History  Problem Relation Age of Onset  . Diabetes Mother   . Hypertension Mother   . Hyperlipidemia Mother   . Heart disease Father   . Hypertension Father   . Hyperlipidemia Father   . COPD Maternal Grandmother   . COPD Maternal Grandfather   . Cancer Paternal Grandfather     prostate  . Stroke Neg Hx   . Kidney disease Neg Hx   . Bladder Cancer Neg Hx     Social History:  reports that she has quit smoking. Her smoking use included Cigarettes. She has a 2.5 pack-year smoking history. She has never used smokeless tobacco. She reports that she does not drink alcohol or use illicit drugs.  ROS: UROLOGY Frequent Urination?: No Hard to postpone urination?: No Burning/pain with urination?: Yes Get up at night to urinate?: No Leakage of urine?: No Urine stream starts and stops?: No Trouble starting stream?: No Do you have to strain to urinate?: No Blood in urine?: No Urinary tract infection?: No Sexually transmitted disease?: No Injury to kidneys or bladder?: No Painful intercourse?: Yes Weak stream?: No Currently pregnant?: No Vaginal bleeding?: No Last  menstrual period?: No  Gastrointestinal Nausea?: No Vomiting?: No Indigestion/heartburn?: No Diarrhea?: No Constipation?: No  Constitutional Fever: No Night sweats?: No Weight loss?: No Fatigue?: No  Skin Skin rash/lesions?: No Itching?: No  Eyes Blurred vision?: No Double vision?: No  Ears/Nose/Throat Sore throat?: No Sinus problems?: No  Hematologic/Lymphatic Swollen glands?: No Easy bruising?: No  Cardiovascular Leg swelling?: No Chest pain?: No  Respiratory Cough?: No Shortness of breath?: No  Endocrine Excessive thirst?: No  Musculoskeletal Back pain?: No Joint pain?: No  Neurological Headaches?: Yes Dizziness?: No  Psychologic Depression?: Yes Anxiety?: Yes  Physical Exam: BP 121/79 mmHg  Pulse 91  Ht 5\' 7"  (1.702 m)  Wt 208  lb 11.2 oz (94.666 kg)  BMI 32.68 kg/m2  LMP 12/22/2015  Constitutional:  Alert and oriented, No acute distress. HEENT: [place] AT, moist mucus membranes.  Trachea midline, no masses. Cardiovascular: No clubbing, cyanosis, or edema. Respiratory: Normal respiratory effort, no increased work of breathing. GI: Abdomen is soft, nontender, nondistended, no abdominal masses GU: No CVA tenderness.  Skin: No rashes, bruises or suspicious lesions. Lymph: No cervical or inguinal adenopathy. Neurologic: Grossly intact, no focal deficits, moving all 4 extremities. Psychiatric: Normal mood and affect.  Laboratory Data: Lab Results  Component Value Date   WBC 6.1 08/16/2013   HGB 14.0 08/16/2013   HCT 38.8 08/16/2013   MCV 89 08/16/2013   PLT 320 08/16/2013    Lab Results  Component Value Date   CREATININE 0.57* 12/19/2012    No results found for: PSA  No results found for: TESTOSTERONE  No results found for: HGBA1C  Urinalysis    Component Value Date/Time   COLORURINE Straw 12/19/2012 2016   APPEARANCEUR Hazy 12/19/2012 2016   LABSPEC 1.010 12/19/2012 2016   PHURINE 7.0 12/19/2012 2016   GLUCOSEU Negative  01/28/2016 1355   GLUCOSEU Negative 12/19/2012 2016   HGBUR Negative 12/19/2012 2016   BILIRUBINUR Negative 01/28/2016 1355   BILIRUBINUR Negative 12/19/2012 2016   KETONESUR Negative 12/19/2012 2016   PROTEINUR Negative 12/19/2012 2016   NITRITE Negative 01/28/2016 1355   NITRITE Negative 12/19/2012 2016   LEUKOCYTESUR Trace* 01/28/2016 1355   LEUKOCYTESUR Negative 12/19/2012 2016    Cystoscopy Procedure Note  Patient identification was confirmed, informed consent was obtained, and patient was prepped using Betadine solution.  Lidocaine jelly was administered per urethral meatus.    Preoperative abx where received prior to procedure.    Procedure: - Flexible cystoscope introduced, without any difficulty.   - Thorough search of the bladder revealed:    normal urethral meatus    normal urothelium    no stones    no ulcers     no tumors    no urethral polyps    no trabeculation  - Ureteral orifices were normal in position and appearance.  Post-Procedure: - Patient tolerated the procedure well   Assessment & Plan:    1. Recurrent UTI: Patient has a history of being treated for 5-6 urinary tract infections over the last year. Her last urine culture was negative. She will call office if symptoms return for a urine culture.  2. Chronic Dysuria: The patient has chronic dysuria which is different from her symptoms that she gets with a UTI. She started Azo cranberry pills for this with no effect. She does not have pelvic pain outside of when she voids. I started her on Uribel. She is instructed take it up to 4 times daily with a glass of each water with each dose. She was warned that her urine will turn blue. I will have her follow-up in one month. If her dysuria continues we may need to treat her with medications for IC such as Elmiron.  Return in about 4 weeks (around 03/13/2016).  Hildred Laser, MD  [facility] [address] [place], [place] [address] [phone]

## 2016-02-26 ENCOUNTER — Other Ambulatory Visit: Payer: Self-pay | Admitting: Unknown Physician Specialty

## 2016-03-04 ENCOUNTER — Other Ambulatory Visit: Payer: Self-pay | Admitting: Gastroenterology

## 2016-03-07 ENCOUNTER — Other Ambulatory Visit: Payer: Self-pay

## 2016-03-07 DIAGNOSIS — K2100 Gastro-esophageal reflux disease with esophagitis, without bleeding: Secondary | ICD-10-CM

## 2016-03-07 MED ORDER — DEXLANSOPRAZOLE 60 MG PO CPDR: 60.0000 mg | DELAYED_RELEASE_CAPSULE | Freq: Every day | ORAL | Status: DC

## 2016-03-12 ENCOUNTER — Other Ambulatory Visit: Payer: Self-pay

## 2016-03-17 ENCOUNTER — Ambulatory Visit (INDEPENDENT_AMBULATORY_CARE_PROVIDER_SITE_OTHER): Payer: BLUE CROSS/BLUE SHIELD | Admitting: Urology

## 2016-03-17 ENCOUNTER — Encounter: Payer: Self-pay | Admitting: Urology

## 2016-03-17 VITALS — BP 98/65 | HR 92 | Ht 67.0 in | Wt 211.9 lb

## 2016-03-17 DIAGNOSIS — R300 Dysuria: Secondary | ICD-10-CM

## 2016-03-17 DIAGNOSIS — N390 Urinary tract infection, site not specified: Secondary | ICD-10-CM | POA: Diagnosis not present

## 2016-03-17 MED ORDER — PENTOSAN POLYSULFATE SODIUM 100 MG PO CAPS: ORAL_CAPSULE | ORAL | Status: DC

## 2016-03-17 NOTE — Progress Notes (Signed)
2:42 PM   [facility] 01/18/1986 [id number]  Referring provider: Kerman PasseyMelinda P Lada, MD [address] [place], [place] [id number]  Chief Complaint  Patient presents with  . Follow-up    one month follow up after cysto    HPI: Patient is a 30 year old Caucasian female who was placed on Uribel for her persistant dysuria after an UTI presents today for a one month follow up.     Background story She says that she has had approximately 5-6 urinary tract infections over the last year.  Her last urinary tract infection was diagnosed on 01/08/2016 through an urgent care. She was placed on Cipro 500 mg twice daily.  She was not feeling any improvement in her urinary symptoms 5 days to the treatment, so she contacted the urgent care facility. They stated that urine cultures noted that the organism was sensitive to the Cipro to continue the medication and follow-up with us.  She does not know the name of the organism. I do not have those records available to me at this visit.  She states her symptoms with urinary tract infections consist of frequency, urgency, dysuria and nocturia.  She was experiencing left CVA tenderness. She has been told that she's had blood in her urine, but she has not seen it herself.  Reviewing her past urine specimens from her primary care physician's office, they have been urinary dips and no microscopic analysis.  She does not have a history of nephrolithiasis. She does suffer with IBS and has alternating diarrhea and constipation. She has not noticed any correlation with these infections with sexual activity or use of tampons.  She also engages in good hygiene white being front to back. She urinates before and after intercourse, but has not had any intercourse for several weeks because her husband is in a rehabilitation center.  She does not take tub baths.  Her urine culture from 01/15/2016 was positive for Viridans streptococcus group which was resistant to  the Cipro she was on, so she was changed to Macrobid.  She is no longer having left flank pain, but she is still experiencing dysuria.  Her CT Renal Stone study from 01/18/2016 did not demonstrate any evidence of ureterolithiasis, hydronephrosis or other acute findings.  The films were reviewed with the patient.    Her cystoscopy performed on 02/14/2016 by Dr. Sherryl BartersBudzyn did not note any abnormalities.    Her UA today was positive for 6-10 WBC's/hpf and a few bacteria.  She denies any gross hematuria or suprapubic pain.  She also has not had fevers, chills, nausea or vomiting.    The Uribel caused constipation and did not improve her symptoms.  PMH: Past Medical History  Diagnosis Date  . MRSA (methicillin resistant Staphylococcus aureus)   . Anxiety     managed by Psych  . Depression     managed by psych  . Asthma   . Allergic rhinitis   . Panic disorder without agoraphobia     managed by Psych  . IBS (irritable bowel syndrome)     per colonoscopy in Feb 2015  . Hiatal hernia     shown on colonoscopy  . Erosive esophagitis     seen on Colonoscopy  . Heartburn     Surgical History: Past Surgical History  Procedure Laterality Date  . Cesarean section    . Tonsillectomy    . Ovarian cyst removal Right   . Colonoscopy  Feb 2015  showed IBS, Hiatal Hernia, Erosive reflux esophagitis  . Esophagogastroduodenoscopy      Home Medications:    Medication List       This list is accurate as of: 03/17/16  2:42 PM.  Always use your most recent med list.               clonazePAM 0.5 MG tablet  Commonly known as:  KLONOPIN  Take 0.5 mg by mouth 2 (two) times daily as needed.     cyclobenzaprine 10 MG tablet  Commonly known as:  FLEXERIL  Take 10 mg by mouth every 8 (eight) hours as needed for muscle spasms. Reported on 03/17/2016     dexlansoprazole 60 MG capsule  Commonly known as:  DEXILANT  Take 1 capsule (60 mg total) by mouth daily.     fluticasone 50 MCG/ACT nasal  spray  Commonly known as:  FLONASE  Place 2 sprays into both nostrils daily. Reported on 03/17/2016     JUNEL FE 1/20 1-20 MG-MCG tablet  Generic drug:  norethindrone-ethinyl estradiol  Reported on 12/21/2015     pentosan polysulfate 100 MG capsule  Commonly known as:  ELMIRON  Take two in the morning and take two in the evening on the empty stomach     PROAIR HFA 108 (90 Base) MCG/ACT inhaler  Generic drug:  albuterol  Inhale 2 puffs into the lungs every 6 (six) hours as needed. Reported on 03/17/2016     promethazine 25 MG tablet  Commonly known as:  PHENERGAN  TAKE 1 TABLET BY MOUTH EVERY 6 HOURS AS NEEDED FOR NAUSEA OR VOMITING     sertraline 100 MG tablet  Commonly known as:  ZOLOFT  Take 200 mg by mouth daily.     SOLUBLE FIBER/PROBIOTICS PO  Take by mouth.     URIBEL 118 MG Caps  Take 1 capsule (118 mg total) by mouth QID.        Allergies:  Allergies  Allergen Reactions  . Sulfa Antibiotics Rash    Family History: Family History  Problem Relation Age of Onset  . Diabetes Mother   . Hypertension Mother   . Hyperlipidemia Mother   . Heart disease Father   . Hypertension Father   . Hyperlipidemia Father   . COPD Maternal Grandmother   . COPD Maternal Grandfather   . Cancer Paternal Grandfather     prostate  . Stroke Neg Hx   . Kidney disease Neg Hx   . Bladder Cancer Neg Hx     Social History:  reports that she has quit smoking. Her smoking use included Cigarettes. She has a 2.5 pack-year smoking history. She has never used smokeless tobacco. She reports that she does not drink alcohol or use illicit drugs.  ROS: UROLOGY Frequent Urination?: No Hard to postpone urination?: No Burning/pain with urination?: Yes Get up at night to urinate?: No Leakage of urine?: No Urine stream starts and stops?: No Trouble starting stream?: No Do you have to strain to urinate?: No Blood in urine?: No Urinary tract infection?: No Sexually transmitted disease?:  No Injury to kidneys or bladder?: No Painful intercourse?: No Weak stream?: No Currently pregnant?: No Vaginal bleeding?: No Last menstrual period?: n  Gastrointestinal Nausea?: No Vomiting?: No Indigestion/heartburn?: No Diarrhea?: No Constipation?: No  Constitutional Fever: No Night sweats?: No Weight loss?: No Fatigue?: No  Skin Skin rash/lesions?: No Itching?: No  Eyes Blurred vision?: No Double vision?: No  Ears/Nose/Throat Sore throat?: No Sinus problems?: No  Hematologic/Lymphatic Swollen glands?: No Easy bruising?: No  Cardiovascular Leg swelling?: No Chest pain?: No  Respiratory Cough?: No Shortness of breath?: No  Endocrine Excessive thirst?: No  Musculoskeletal Back pain?: No Joint pain?: No  Neurological Headaches?: No Dizziness?: No  Psychologic Depression?: No Anxiety?: No  Physical Exam: BP 98/65 mmHg  Pulse 92  Ht  (1.702 m)  Wt 211 lb 14.4 oz (96.117 kg)  BMI 33.18 kg/m2  LMP 02/27/2016  Constitutional: Well nourished. Alert and oriented, No acute distress. HEENT: [place] AT, moist mucus membranes. Trachea midline, no masses. Cardiovascular: No clubbing, cyanosis, or edema. Respiratory: Normal respiratory effort, no increased work of breathing. Skin: No rashes, bruises or suspicious lesions. Lymph: No cervical or inguinal adenopathy. Neurologic: Grossly intact, no focal deficits, moving all 4 extremities. Psychiatric: Normal mood and affect.  Laboratory Data: Lab Results  Component Value Date   WBC 6.1 08/16/2013   HGB 14.0 08/16/2013   HCT 38.8 08/16/2013   MCV 89 08/16/2013   PLT 320 08/16/2013    Lab Results  Component Value Date   CREATININE 0.57* 12/19/2012    Lab Results  Component Value Date   TSH 0.774 12/19/2012    Lab Results  Component Value Date   AST 21 12/19/2012   Lab Results  Component Value Date   ALT 26 12/19/2012    Urinalysis Results for orders placed or performed in visit  on 03/17/16  Microscopic Examination  Result Value Ref Range   WBC, UA 6-10 (A) 0 -  5 /hpf   RBC, UA 0-2 0 -  2 /hpf   Epithelial Cells (non renal) >10 (A) 0 - 10 /hpf   Bacteria, UA Few None seen/Few  Urinalysis, Complete  Result Value Ref Range   Specific Gravity, UA 1.025 1.005 - 1.030   pH, UA 5.5 5.0 - 7.5   Color, UA Yellow Yellow   Appearance Ur Cloudy (A) Clear   Leukocytes, UA Trace (A) Negative   Protein, UA Negative Negative/Trace   Glucose, UA Negative Negative   Ketones, UA Negative Negative   RBC, UA 2+ (A) Negative   Bilirubin, UA Negative Negative   Urobilinogen, Ur 0.2 0.2 - 1.0 mg/dL   Nitrite, UA Negative Negative   Microscopic Examination See below:      Pertinent Imaging: CLINICAL DATA: Left flank pain for 2 weeks. Nausea. Urinary tract infection  EXAM: CT ABDOMEN AND PELVIS WITHOUT CONTRAST  TECHNIQUE: Multidetector CT imaging of the abdomen and pelvis was performed following the standard protocol without IV contrast.  COMPARISON: 08/02/2013  FINDINGS: Lower chest: No acute findings.  Hepatobiliary: No mass visualized on this un-enhanced exam. Gallbladder is unremarkable  Pancreas: No mass or inflammatory process identified on this un-enhanced exam.  Spleen: Within normal limits in size.  Adrenals/Urinary Tract: No evidence of urolithiasis or hydronephrosis. No definite mass visualized on this un-enhanced exam.  Stomach/Bowel: No evidence of obstruction, inflammatory process, or abnormal fluid collections. Appendix is normal.  Vascular/Lymphatic: No pathologically enlarged lymph nodes. No evidence of abdominal aortic aneurysm.  Reproductive: No mass or other significant abnormality.  Other: None.  Musculoskeletal: No suspicious bone lesions identified.  IMPRESSION: No evidence of urolithiasis, hydronephrosis, or other acute findings.   Electronically Signed  By: Myles Rosenthal M.D.  On: 01/18/2016  14:45          Assessment & Plan:     1. Recurrent UTI:   Patient has a history of being treated for 5-6 urinary tract infections over the  last year.  Her last infection was with [place] and has been treated with the appropriate antibiotic.  The repeated UCx was negative, but dysuria persisted.  Cysto did not demonstrate any abnormalities.    2. Left flank pain:   Patient CT Renal Stone study did not demonstrate any nephrolithiasis or hydronephrosis.    3. Dysuria:   Patient continues with dysuria.  Cystoscopy did not demonstrate CIS.  She may have IC, but this is a diagnosis of exclusion.   I have started her on Elmiron 100 mg, two capsules in the am and two capsules in the pm.  She is instructed to take it without food.  She will RTC in 3 months for UA and symptom recheck.  We also discussed rescue solutions, but she is not interested in starting this at this time.   - Urinalysis, Complete  Return in about 3 months (around 06/16/2016) for UA and office visit.  These notes generated with voice recognition software. I apologize for typographical errors.  Michiel Cowboy, PA-C  [facility] [address] [place], [place] [address] [phone]

## 2016-03-18 LAB — URINALYSIS, COMPLETE
BILIRUBIN UA: NEGATIVE
Glucose, UA: NEGATIVE
KETONES UA: NEGATIVE
NITRITE UA: NEGATIVE
Protein, UA: NEGATIVE
SPEC GRAV UA: 1.025 (ref 1.005–1.030)
Urobilinogen, Ur: 0.2 mg/dL (ref 0.2–1.0)
pH, UA: 5.5 (ref 5.0–7.5)

## 2016-03-18 LAB — MICROSCOPIC EXAMINATION

## 2016-04-15 DIAGNOSIS — F330 Major depressive disorder, recurrent, mild: Secondary | ICD-10-CM | POA: Diagnosis not present

## 2016-04-15 DIAGNOSIS — F411 Generalized anxiety disorder: Secondary | ICD-10-CM | POA: Diagnosis not present

## 2016-04-15 DIAGNOSIS — F410 Panic disorder [episodic paroxysmal anxiety] without agoraphobia: Secondary | ICD-10-CM | POA: Diagnosis not present

## 2016-04-18 DIAGNOSIS — D225 Melanocytic nevi of trunk: Secondary | ICD-10-CM | POA: Diagnosis not present

## 2016-04-21 DIAGNOSIS — L859 Epidermal thickening, unspecified: Secondary | ICD-10-CM | POA: Diagnosis not present

## 2016-05-02 DIAGNOSIS — N926 Irregular menstruation, unspecified: Secondary | ICD-10-CM | POA: Diagnosis not present

## 2016-05-06 DIAGNOSIS — R102 Pelvic and perineal pain: Secondary | ICD-10-CM | POA: Diagnosis not present

## 2016-05-06 DIAGNOSIS — N926 Irregular menstruation, unspecified: Secondary | ICD-10-CM | POA: Diagnosis not present

## 2016-05-06 DIAGNOSIS — N939 Abnormal uterine and vaginal bleeding, unspecified: Secondary | ICD-10-CM | POA: Diagnosis not present

## 2016-05-29 DIAGNOSIS — F330 Major depressive disorder, recurrent, mild: Secondary | ICD-10-CM | POA: Diagnosis not present

## 2016-06-18 DIAGNOSIS — F330 Major depressive disorder, recurrent, mild: Secondary | ICD-10-CM | POA: Diagnosis not present

## 2016-07-04 DIAGNOSIS — F410 Panic disorder [episodic paroxysmal anxiety] without agoraphobia: Secondary | ICD-10-CM | POA: Diagnosis not present

## 2016-07-04 DIAGNOSIS — F330 Major depressive disorder, recurrent, mild: Secondary | ICD-10-CM | POA: Diagnosis not present

## 2016-07-04 DIAGNOSIS — F411 Generalized anxiety disorder: Secondary | ICD-10-CM | POA: Diagnosis not present

## 2016-07-07 DIAGNOSIS — F330 Major depressive disorder, recurrent, mild: Secondary | ICD-10-CM | POA: Diagnosis not present

## 2016-07-09 DIAGNOSIS — N3001 Acute cystitis with hematuria: Secondary | ICD-10-CM | POA: Diagnosis not present

## 2016-07-09 DIAGNOSIS — R350 Frequency of micturition: Secondary | ICD-10-CM | POA: Diagnosis not present

## 2016-07-09 DIAGNOSIS — R300 Dysuria: Secondary | ICD-10-CM | POA: Diagnosis not present

## 2016-07-21 DIAGNOSIS — F330 Major depressive disorder, recurrent, mild: Secondary | ICD-10-CM | POA: Diagnosis not present

## 2016-08-04 DIAGNOSIS — F330 Major depressive disorder, recurrent, mild: Secondary | ICD-10-CM | POA: Diagnosis not present

## 2016-08-07 DIAGNOSIS — N3001 Acute cystitis with hematuria: Secondary | ICD-10-CM | POA: Diagnosis not present

## 2016-08-07 DIAGNOSIS — R300 Dysuria: Secondary | ICD-10-CM | POA: Diagnosis not present

## 2016-08-07 DIAGNOSIS — R319 Hematuria, unspecified: Secondary | ICD-10-CM | POA: Diagnosis not present

## 2016-08-07 DIAGNOSIS — R350 Frequency of micturition: Secondary | ICD-10-CM | POA: Diagnosis not present

## 2016-08-21 ENCOUNTER — Ambulatory Visit (INDEPENDENT_AMBULATORY_CARE_PROVIDER_SITE_OTHER): Payer: BLUE CROSS/BLUE SHIELD | Admitting: Family Medicine

## 2016-08-21 ENCOUNTER — Encounter: Payer: Self-pay | Admitting: Family Medicine

## 2016-08-21 VITALS — BP 112/79 | HR 102 | Temp 98.7°F | Wt 207.0 lb

## 2016-08-21 DIAGNOSIS — R399 Unspecified symptoms and signs involving the genitourinary system: Secondary | ICD-10-CM | POA: Diagnosis not present

## 2016-08-21 DIAGNOSIS — N390 Urinary tract infection, site not specified: Secondary | ICD-10-CM

## 2016-08-21 LAB — MICROSCOPIC EXAMINATION: WBC, UA: NONE SEEN /hpf (ref 0–?)

## 2016-08-21 LAB — UA/M W/RFLX CULTURE, ROUTINE
BILIRUBIN UA: NEGATIVE
Glucose, UA: NEGATIVE
KETONES UA: NEGATIVE
LEUKOCYTES UA: NEGATIVE
Nitrite, UA: NEGATIVE
PROTEIN UA: NEGATIVE
SPEC GRAV UA: 1.02 (ref 1.005–1.030)
Urobilinogen, Ur: 0.2 mg/dL (ref 0.2–1.0)
pH, UA: 6 (ref 5.0–7.5)

## 2016-08-21 MED ORDER — NITROFURANTOIN MACROCRYSTAL 50 MG PO CAPS: 50.0000 mg | ORAL_CAPSULE | Freq: Every day | ORAL | 0 refills | Status: DC

## 2016-08-21 NOTE — Patient Instructions (Signed)
Follow up as needed

## 2016-08-21 NOTE — Progress Notes (Signed)
   BP 112/79   Pulse (!) 102   Temp 98.7 F (37.1 C)   Wt 207 lb (93.9 kg)   LMP 08/20/2016 (Exact Date)   SpO2 97%   BMI 32.42 kg/m    Subjective:    Patient ID: Deborah Fields, female    DOB: 01/04/1986, 30 y.o.   MRN: [id number]  HPI: Deborah Fields is a 30 y.o. female  Chief Complaint  Patient presents with  . Urinary Tract Infection    burning, frequency, urgency, and odor. Off and on since end of July. Has appt with urology on 9/18.   Patient with long history of UTI's, followed by Urology. Has appt 9/18 with them but wanted to check urine before then. Treated 2-3 weeks agoas well as [facility] to [facility]. Still having burning, frequency, urgency, and urine odor. Urology concluding likely she has Interstitial Cystitis. Was previously on prophylactic macrodantin and did very well. Denies fever, chills, back pain, N/V/D. Has been drinking lots of water, taking cranberry pills, urinating before and after intercourse, etc trying to prevent UTIs but nothing is helping.   Relevant past medical, surgical, family and social history reviewed and updated as indicated. Interim medical history since our last visit reviewed. Allergies and medications reviewed and updated.  Review of Systems  Constitutional: Negative.   HENT: Negative.   Respiratory: Negative.   Cardiovascular: Negative.   Gastrointestinal: Negative.   Genitourinary: Positive for dysuria and frequency.  Musculoskeletal: Negative.   Neurological: Negative.   Psychiatric/Behavioral: Negative.     Per HPI unless specifically indicated above     Objective:    BP 112/79   Pulse (!) 102   Temp 98.7 F (37.1 C)   Wt 207 lb (93.9 kg)   LMP 08/20/2016 (Exact Date)   SpO2 97%   BMI 32.42 kg/m   Wt Readings from Last 3 Encounters:  08/21/16 207 lb (93.9 kg)  03/17/16 211 lb 14.4 oz (96.1 kg)  02/14/16 208 lb 11.2 oz (94.7 kg)    Physical Exam  Constitutional: She is oriented to  person, place, and time. She appears well-developed and well-nourished. No distress.  HENT:  Head: Atraumatic.  Eyes: Conjunctivae are normal. No scleral icterus.  Neck: Normal range of motion. Neck supple.  Cardiovascular: Normal rate and normal heart sounds.   Pulmonary/Chest: Effort normal and breath sounds normal.  Abdominal: Soft. Bowel sounds are normal. She exhibits no distension. There is no tenderness.  Musculoskeletal: Normal range of motion.  No CVA tenderness  Neurological: She is alert and oriented to person, place, and time.  Skin: Skin is warm and dry.  Psychiatric: She has a normal mood and affect. Her behavior is normal.  Nursing note and vitals reviewed.     Assessment & Plan:   Problem List Items Addressed This Visit      Genitourinary   Recurrent UTI   Relevant Medications   nitrofurantoin (MACRODANTIN) 50 MG capsule    Other Visit Diagnoses    Urinary symptom or sign    -  Primary   Relevant Orders   UA/M w/rflx Culture, Routine (STAT)     Symptoms likely d/t Interstitial Cystitis. Negative U/A. Will await cx. Prophylactic abx sent for situational use as she has done previously. She will discuss long-term solution with Urology later this month.   Follow up plan: Return if symptoms worsen or fail to improve.

## 2016-08-26 DIAGNOSIS — F330 Major depressive disorder, recurrent, mild: Secondary | ICD-10-CM | POA: Diagnosis not present

## 2016-08-27 DIAGNOSIS — Z01419 Encounter for gynecological examination (general) (routine) without abnormal findings: Secondary | ICD-10-CM | POA: Diagnosis not present

## 2016-09-01 ENCOUNTER — Ambulatory Visit (INDEPENDENT_AMBULATORY_CARE_PROVIDER_SITE_OTHER): Payer: BLUE CROSS/BLUE SHIELD | Admitting: Urology

## 2016-09-01 ENCOUNTER — Encounter: Payer: Self-pay | Admitting: Urology

## 2016-09-01 VITALS — BP 103/68 | HR 86 | Ht 67.0 in | Wt 205.4 lb

## 2016-09-01 DIAGNOSIS — R300 Dysuria: Secondary | ICD-10-CM

## 2016-09-01 DIAGNOSIS — R3129 Other microscopic hematuria: Secondary | ICD-10-CM | POA: Diagnosis not present

## 2016-09-01 DIAGNOSIS — N390 Urinary tract infection, site not specified: Secondary | ICD-10-CM

## 2016-09-01 DIAGNOSIS — F330 Major depressive disorder, recurrent, mild: Secondary | ICD-10-CM | POA: Diagnosis not present

## 2016-09-01 MED ORDER — NITROFURANTOIN MACROCRYSTAL 50 MG PO CAPS: 50.0000 mg | ORAL_CAPSULE | Freq: Every day | ORAL | 0 refills | Status: DC

## 2016-09-01 NOTE — Progress Notes (Signed)
2:36 PM   Deborah Fields 07/16/1986 [id number]  Referring provider: Kerman PasseyMelinda P Lada, MD [address] [place], [place] [address]  Chief Complaint  Patient presents with  . Recurrent UTI    HPI: Patient is a 30 year old Caucasian female who presents today after experiencing an UTI diagnosed at a [facility].  Background story Patient presented to us with the complaint of approximately 5-6 urinary tract infections over the last year.  She states her symptoms with urinary tract infections consist of frequency, urgency, dysuria and nocturia.  She was experiencing left CVA tenderness. She had been told that she's had blood in her urine, but she had not seen it herself.  Past urine specimens from her primary care physician's office were reviewed and they had been urinary dips and no microscopic analysis.  She does not have a history of nephrolithiasis. She does suffer with IBS and has alternating diarrhea and constipation. She has not noticed any correlation with these infections with sexual activity or use of tampons.  She also engages in good hygiene white being front to back. She urinates before and after intercourse, but had not had any intercourse for several weeks because her husband is in a rehabilitation center.  She does not take tub baths.  Her urine culture from 01/15/2016 was positive for Viridans streptococcus and her urine culture from 01/28/2016 was positive for mixed urogenital flora and Viridans streptococcus.    Her CT Renal Stone study from 01/18/2016 did not demonstrate any evidence of ureterolithiasis, hydronephrosis or other acute findings.  The films were reviewed with the patient.    She underwent cystoscopic examination on 02/14/2016 and no abnormalities were found.  She was placed on Elmiron after Uribel failed to control her symptoms and caused constipation.    She started to experience symptoms of an UTI a month ago and was seen at a [facility] and  given an antibiotic.  She did not find improvement in urinary symptoms and went back to the [facility] and was placed on another antibiotic.    She did not find improvement and was seen at her PCP's office.  UA noted 3-10RBC's/hpf.  Urine culture was ordered, but it was not performed.  Macrodantin suppression therapy was prescribed, but it was not started.    She is not having symptoms of an UTI at today's visit.  She is taking her cranberry tablets as prescribed, probiotics, vitamin C and increasing her water intake.  She is experiencing her baseline frequency, urgency, dysuria and dyspareunia.  She denies any gross hematuria or suprapubic pain.  She also has not had fevers, chills, nausea or vomiting.    PMH: Past Medical History:  Diagnosis Date  . Allergic rhinitis   . Anxiety    managed by Psych  . Asthma   . Depression    managed by psych  . Erosive esophagitis    seen on Colonoscopy  . Heartburn   . Hiatal hernia    shown on colonoscopy  . IBS (irritable bowel syndrome)    per colonoscopy in Feb 2015  . MRSA (methicillin resistant Staphylococcus aureus)   . Panic disorder without agoraphobia    managed by Psych    Surgical History: Past Surgical History:  Procedure Laterality Date  . CESAREAN SECTION    . COLONOSCOPY  Feb 2015   showed IBS, Hiatal Hernia, Erosive reflux esophagitis  . ESOPHAGOGASTRODUODENOSCOPY    . OVARIAN CYST REMOVAL Right   . TONSILLECTOMY  Home Medications:    Medication List       Accurate as of 09/01/16 11:59 PM. Always use your most recent med list.          clonazePAM 0.5 MG tablet Commonly known as:  KLONOPIN Take 0.5 mg by mouth 2 (two) times daily as needed.   dexlansoprazole 60 MG capsule Commonly known as:  DEXILANT Take 1 capsule (60 mg total) by mouth daily.   fluticasone 50 MCG/ACT nasal spray Commonly known as:  FLONASE Place 2 sprays into both nostrils daily. Reported on 03/17/2016   JUNEL FE 1/20 1-20 MG-MCG  tablet Generic drug:  norethindrone-ethinyl estradiol Reported on 12/21/2015   nitrofurantoin 50 MG capsule Commonly known as:  MACRODANTIN Take 1 capsule (50 mg total) by mouth at bedtime.   PROAIR HFA 108 (90 Base) MCG/ACT inhaler Generic drug:  albuterol Inhale 2 puffs into the lungs every 6 (six) hours as needed. Reported on 03/17/2016   promethazine 25 MG tablet Commonly known as:  PHENERGAN TAKE 1 TABLET BY MOUTH EVERY 6 HOURS AS NEEDED FOR NAUSEA OR VOMITING   sertraline 100 MG tablet Commonly known as:  ZOLOFT Take 200 mg by mouth daily.   SOLUBLE FIBER/PROBIOTICS PO Take by mouth.       Allergies:  Allergies  Allergen Reactions  . Sulfa Antibiotics Rash    Family History: Family History  Problem Relation Age of Onset  . Diabetes Mother   . Hypertension Mother   . Hyperlipidemia Mother   . Heart disease Father   . Hypertension Father   . Hyperlipidemia Father   . COPD Maternal Grandmother   . COPD Maternal Grandfather   . Cancer Paternal Grandfather     prostate  . Stroke Neg Hx   . Kidney disease Neg Hx   . Bladder Cancer Neg Hx     Social History:  reports that she has quit smoking. Her smoking use included Cigarettes. She has a 2.50 pack-year smoking history. She has never used smokeless tobacco. She reports that she does not drink alcohol or use drugs.  ROS: UROLOGY Frequent Urination?: Yes Hard to postpone urination?: Yes Burning/pain with urination?: Yes Get up at night to urinate?: No Leakage of urine?: No Urine stream starts and stops?: No Trouble starting stream?: No Do you have to strain to urinate?: No Blood in urine?: No Urinary tract infection?: No Sexually transmitted disease?: No Injury to kidneys or bladder?: No Painful intercourse?: Yes Weak stream?: No Currently pregnant?: No Vaginal bleeding?: No Last menstrual period?: n  Gastrointestinal Nausea?: No Vomiting?: No Indigestion/heartburn?: No Diarrhea?:  No Constipation?: No  Constitutional Fever: No Night sweats?: No Weight loss?: No Fatigue?: No  Skin Skin rash/lesions?: No Itching?: No  Eyes Blurred vision?: No Double vision?: No  Ears/Nose/Throat Sore throat?: No Sinus problems?: No  Hematologic/Lymphatic Swollen glands?: No Easy bruising?: No  Cardiovascular Leg swelling?: No Chest pain?: No  Respiratory Cough?: No Shortness of breath?: No  Endocrine Excessive thirst?: No  Musculoskeletal Back pain?: No Joint pain?: No  Neurological Headaches?: No Dizziness?: No  Psychologic Depression?: No Anxiety?: No  Physical Exam: BP 103/68 (BP Location: Left Arm, Patient Position: Sitting, Cuff Size: Normal)   Pulse 86   Ht 5\' 7"  (1.702 m)   Wt 205 lb 6.4 oz (93.2 kg)   LMP 08/20/2016 (Exact Date)   BMI 32.17 kg/m   Constitutional: Well nourished. Alert and oriented, No acute distress. HEENT: [place] AT, moist mucus membranes. Trachea midline, no masses. Cardiovascular:  No clubbing, cyanosis, or edema. Respiratory: Normal respiratory effort, no increased work of breathing. Skin: No rashes, bruises or suspicious lesions. Lymph: No cervical or inguinal adenopathy. Neurologic: Grossly intact, no focal deficits, moving all 4 extremities. Psychiatric: Normal mood and affect.  Laboratory Data: Lab Results  Component Value Date   WBC 6.1 08/16/2013   HGB 14.0 08/16/2013   HCT 38.8 08/16/2013   MCV 89 08/16/2013   PLT 320 08/16/2013    Lab Results  Component Value Date   CREATININE 0.57 (L) 12/19/2012    Lab Results  Component Value Date   TSH 0.774 12/19/2012    Lab Results  Component Value Date   AST 21 12/19/2012   Lab Results  Component Value Date   ALT 26 12/19/2012    Urinalysis Results for orders placed or performed in visit on 08/21/16  Microscopic Examination  Result Value Ref Range   WBC, UA None seen 0 - 5 /hpf   RBC, UA 3-10 (A) 0 - 2 /hpf   Epithelial Cells (non renal)  0-10 0 - 10 /hpf   Mucus, UA Present Not Estab.   Bacteria, UA Few None seen/Few  UA/M w/rflx Culture, Routine (STAT)  Result Value Ref Range   Specific Gravity, UA 1.020 1.005 - 1.030   pH, UA 6.0 5.0 - 7.5   Color, UA Yellow Yellow   Appearance Ur Clear Clear   Leukocytes, UA Negative Negative   Protein, UA Negative Negative/Trace   Glucose, UA Negative Negative   Ketones, UA Negative Negative   RBC, UA 3+ (A) Negative   Bilirubin, UA Negative Negative   Urobilinogen, Ur 0.2 0.2 - 1.0 mg/dL   Nitrite, UA Negative Negative   Microscopic Examination See below:      Pertinent Imaging: CLINICAL DATA: Left flank pain for 2 weeks. Nausea. Urinary tract infection  EXAM: CT ABDOMEN AND PELVIS WITHOUT CONTRAST  TECHNIQUE: Multidetector CT imaging of the abdomen and pelvis was performed following the standard protocol without IV contrast.  COMPARISON: 08/02/2013  FINDINGS: Lower chest: No acute findings.  Hepatobiliary: No mass visualized on this un-enhanced exam. Gallbladder is unremarkable  Pancreas: No mass or inflammatory process identified on this un-enhanced exam.  Spleen: Within normal limits in size.  Adrenals/Urinary Tract: No evidence of urolithiasis or hydronephrosis. No definite mass visualized on this un-enhanced exam.  Stomach/Bowel: No evidence of obstruction, inflammatory process, or abnormal fluid collections. Appendix is normal.  Vascular/Lymphatic: No pathologically enlarged lymph nodes. No evidence of abdominal aortic aneurysm.  Reproductive: No mass or other significant abnormality.  Other: None.  Musculoskeletal: No suspicious bone lesions identified.  IMPRESSION: No evidence of urolithiasis, hydronephrosis, or other acute findings.   Electronically Signed  By: Myles Rosenthal M.D.  On: 01/18/2016 14:45       Results for Deborah Fields, Deborah Fields (MRN [id number]) as of 01/15/2016 14:38  Ref. Range 01/15/2016 14:25  Scan  Result Unknown 13    Assessment & Plan:     1. Recurrent UTI's  - Patient has been diligent in abiding by behavioral changes in an effort to reduce her recurrent urinary tract infections.  We will start a present course of antibiotics with Macrodantin 50 mg daily for 3 months.    - she will contact our office for any breakthrough infections  2. Microscopic hematuria   - Patient's CT Renal Stone study did not demonstrate any nephrolithiasis or hydronephrosis.    - continue to monitor  3. Dysuria  - recent cystoscopy was negative  -  Elmiron was not effective  - trial of antibiotic suppression  Return for patient will call.  These notes generated with voice recognition software. I apologize for typographical errors.  Michiel Cowboy, PA-C  [facility] [address], [address] [place], [place] [address] [phone]

## 2016-09-04 DIAGNOSIS — Z1329 Encounter for screening for other suspected endocrine disorder: Secondary | ICD-10-CM | POA: Diagnosis not present

## 2016-09-04 DIAGNOSIS — Z131 Encounter for screening for diabetes mellitus: Secondary | ICD-10-CM | POA: Diagnosis not present

## 2016-09-04 DIAGNOSIS — Z8342 Family history of familial hypercholesterolemia: Secondary | ICD-10-CM | POA: Diagnosis not present

## 2016-09-04 DIAGNOSIS — Z01419 Encounter for gynecological examination (general) (routine) without abnormal findings: Secondary | ICD-10-CM | POA: Diagnosis not present

## 2016-09-04 DIAGNOSIS — Z13220 Encounter for screening for lipoid disorders: Secondary | ICD-10-CM | POA: Diagnosis not present

## 2016-09-04 DIAGNOSIS — E669 Obesity, unspecified: Secondary | ICD-10-CM | POA: Diagnosis not present

## 2016-09-04 DIAGNOSIS — Z6832 Body mass index (BMI) 32.0-32.9, adult: Secondary | ICD-10-CM | POA: Diagnosis not present

## 2016-09-29 DIAGNOSIS — F330 Major depressive disorder, recurrent, mild: Secondary | ICD-10-CM | POA: Diagnosis not present

## 2016-10-13 DIAGNOSIS — F330 Major depressive disorder, recurrent, mild: Secondary | ICD-10-CM | POA: Diagnosis not present

## 2016-10-19 DIAGNOSIS — R1310 Dysphagia, unspecified: Secondary | ICD-10-CM | POA: Diagnosis not present

## 2016-10-19 DIAGNOSIS — R070 Pain in throat: Secondary | ICD-10-CM | POA: Diagnosis not present

## 2016-10-20 ENCOUNTER — Encounter: Payer: Self-pay | Admitting: Family Medicine

## 2016-10-20 ENCOUNTER — Ambulatory Visit (INDEPENDENT_AMBULATORY_CARE_PROVIDER_SITE_OTHER): Payer: BLUE CROSS/BLUE SHIELD | Admitting: Family Medicine

## 2016-10-20 VITALS — BP 102/71 | HR 91 | Temp 98.7°F | Wt 198.0 lb

## 2016-10-20 DIAGNOSIS — R1310 Dysphagia, unspecified: Secondary | ICD-10-CM | POA: Diagnosis not present

## 2016-10-20 MED ORDER — ALBUTEROL SULFATE HFA 108 (90 BASE) MCG/ACT IN AERS: 2.0000 | INHALATION_SPRAY | Freq: Four times a day (QID) | RESPIRATORY_TRACT | 2 refills | Status: DC | PRN

## 2016-10-20 NOTE — Patient Instructions (Signed)
Follow up as scheduled.  

## 2016-10-20 NOTE — Progress Notes (Signed)
BP 102/71   Pulse 91   Temp 98.7 F (37.1 C)   Wt 198 lb (89.8 kg)   LMP 09/28/2016 (Exact Date)   SpO2 98%   BMI 31.01 kg/m    Subjective:    Patient ID: Deborah Fields, female    DOB: 05/23/1986, 30 y.o.   MRN: [id number]  HPI: Deborah BrayRebecca H Mirabella is a 30 y.o. female  Chief Complaint  Patient presents with  . Mass    x 2 weeks, feels like she has a lump in her throat. Bernie CoveySaxenda was written by GYN noticed after starting that. Saw UC yest, strep was negative.    Patient presents with 2 week history of feeling like there is a lump in her throat. Feels constricted in mid-neck area, especially on left side. States it wakes her up from sleep and has recently been causing solid food to catch in her throat. This has never happened to her before. Hx of asthma and reflux esophagitis, taking dexilant daily previously under good control.  Only new change has been adding saxenda about a month ago for weight loss. Discussed new sxs briefly with prescribing physician who mentioned it may be causing her reflux to act up, causing her sxs. Has been off saxenda since Friday morning and not noticed any relief.  Went to UC yesterday and was given strep test which was negative. They recommended she see ENT. Has appt scheduled later this month with them.  Pt very anxious about these sxs, tearful.   Relevant past medical, surgical, family and social history reviewed and updated as indicated. Interim medical history since our last visit reviewed. Allergies and medications reviewed and updated.  Review of Systems  Constitutional: Negative.   HENT: Negative.   Eyes: Negative.   Respiratory: Negative.   Cardiovascular: Negative.   Gastrointestinal:       Dysphagia to solids  Genitourinary: Negative.   Musculoskeletal: Negative.   Neurological: Negative.   Psychiatric/Behavioral: The patient is nervous/anxious.     Per HPI unless specifically indicated above     Objective:    BP 102/71   Pulse  91   Temp 98.7 F (37.1 C)   Wt 198 lb (89.8 kg)   LMP 09/28/2016 (Exact Date)   SpO2 98%   BMI 31.01 kg/m   Wt Readings from Last 3 Encounters:  10/20/16 198 lb (89.8 kg)  09/01/16 205 lb 6.4 oz (93.2 kg)  08/21/16 207 lb (93.9 kg)    Physical Exam  Constitutional: She is oriented to person, place, and time. She appears well-developed and well-nourished. No distress.  HENT:  Head: Atraumatic.  Eyes: Conjunctivae are normal. Pupils are equal, round, and reactive to light. No scleral icterus.  Neck: Normal range of motion. Neck supple. No tracheal deviation present. No thyromegaly present.  Cardiovascular: Normal rate.   Pulmonary/Chest: Effort normal and breath sounds normal. No stridor. No respiratory distress.  Musculoskeletal: Normal range of motion.  Lymphadenopathy:    She has no cervical adenopathy.  Neurological: She is alert and oriented to person, place, and time.  Skin: Skin is warm and dry.  Psychiatric: She has a normal mood and affect. Her behavior is normal.  Nursing note and vitals reviewed.     Assessment & Plan:   Problem List Items Addressed This Visit    None    Visit Diagnoses    Dysphagia, unspecified type    -  Primary   Relevant Orders   TSH   US Soft  Tissue Head/Neck      Patient extremely concerned about sxs and wanting to get to the bottom of this right away. Reassurance given Await a TSH, soft tissue neck U/S ordered. Continue dexilant, add zantac BID and eat soft bland diet. Albuterol inhaler sent in case getting SOB. Keep follow up appts with GYN and ENT in case issue persists. Stay off saxenda until follow up with GYN.   She knows to go to the ER right away for severe worsening sxs.    Follow up plan: Return for as scheduled.

## 2016-10-21 LAB — TSH: TSH: 1.47 u[IU]/mL (ref 0.450–4.500)

## 2016-10-24 ENCOUNTER — Other Ambulatory Visit: Payer: Self-pay | Admitting: Family Medicine

## 2016-10-24 DIAGNOSIS — R221 Localized swelling, mass and lump, neck: Secondary | ICD-10-CM

## 2016-10-24 DIAGNOSIS — R1310 Dysphagia, unspecified: Secondary | ICD-10-CM

## 2016-10-24 NOTE — Addendum Note (Signed)
Addended by: Roosvelt MaserLANE, Ilyanna Baillargeon E on: 10/24/2016 03:05 PM   Modules accepted: Orders

## 2016-10-27 ENCOUNTER — Telehealth: Payer: Self-pay | Admitting: Family Medicine

## 2016-10-27 ENCOUNTER — Ambulatory Visit
Admission: RE | Admit: 2016-10-27 | Discharge: 2016-10-27 | Disposition: A | Discharge status: Home / Self Care | Payer: BLUE CROSS/BLUE SHIELD | Source: Ambulatory Visit | Attending: Family Medicine | Admitting: Family Medicine

## 2016-10-27 DIAGNOSIS — R221 Localized swelling, mass and lump, neck: Secondary | ICD-10-CM | POA: Diagnosis not present

## 2016-10-27 DIAGNOSIS — R1310 Dysphagia, unspecified: Secondary | ICD-10-CM | POA: Diagnosis not present

## 2016-10-27 DIAGNOSIS — E042 Nontoxic multinodular goiter: Secondary | ICD-10-CM | POA: Diagnosis not present

## 2016-10-27 NOTE — Telephone Encounter (Signed)
Patient notified

## 2016-10-27 NOTE — Telephone Encounter (Signed)
Please call pt and let her know that her ultrasound did show several tiny nodules on her thyroid which are likely benign and are probably just incidental findings. ENT will review this report when she goes for her appointment later on this week with them.

## 2016-10-28 DIAGNOSIS — F330 Major depressive disorder, recurrent, mild: Secondary | ICD-10-CM | POA: Diagnosis not present

## 2016-10-28 DIAGNOSIS — F411 Generalized anxiety disorder: Secondary | ICD-10-CM | POA: Diagnosis not present

## 2016-10-28 DIAGNOSIS — F410 Panic disorder [episodic paroxysmal anxiety] without agoraphobia: Secondary | ICD-10-CM | POA: Diagnosis not present

## 2016-10-30 ENCOUNTER — Other Ambulatory Visit: Payer: Self-pay | Admitting: Otolaryngology

## 2016-10-30 DIAGNOSIS — R221 Localized swelling, mass and lump, neck: Secondary | ICD-10-CM

## 2016-10-30 DIAGNOSIS — R1314 Dysphagia, pharyngoesophageal phase: Secondary | ICD-10-CM | POA: Diagnosis not present

## 2016-10-30 DIAGNOSIS — E041 Nontoxic single thyroid nodule: Secondary | ICD-10-CM | POA: Diagnosis not present

## 2016-11-04 ENCOUNTER — Ambulatory Visit
Admission: RE | Admit: 2016-11-04 | Discharge: 2016-11-04 | Disposition: A | Discharge status: Home / Self Care | Payer: BLUE CROSS/BLUE SHIELD | Source: Ambulatory Visit | Attending: Otolaryngology | Admitting: Otolaryngology

## 2016-11-04 DIAGNOSIS — R221 Localized swelling, mass and lump, neck: Secondary | ICD-10-CM | POA: Insufficient documentation

## 2016-11-04 DIAGNOSIS — E041 Nontoxic single thyroid nodule: Secondary | ICD-10-CM | POA: Insufficient documentation

## 2016-11-04 DIAGNOSIS — R1310 Dysphagia, unspecified: Secondary | ICD-10-CM | POA: Diagnosis not present

## 2016-11-04 MED ORDER — IOPAMIDOL (ISOVUE-300) INJECTION 61%
75.0000 mL | Freq: Once | INTRAVENOUS | Status: AC | PRN
  Administered 2016-11-04: 75 mL via INTRAVENOUS

## 2016-11-05 DIAGNOSIS — E041 Nontoxic single thyroid nodule: Secondary | ICD-10-CM | POA: Diagnosis not present

## 2016-11-05 DIAGNOSIS — R1314 Dysphagia, pharyngoesophageal phase: Secondary | ICD-10-CM | POA: Diagnosis not present

## 2016-11-11 DIAGNOSIS — F330 Major depressive disorder, recurrent, mild: Secondary | ICD-10-CM | POA: Diagnosis not present

## 2016-11-20 DIAGNOSIS — R030 Elevated blood-pressure reading, without diagnosis of hypertension: Secondary | ICD-10-CM | POA: Diagnosis not present

## 2016-11-20 DIAGNOSIS — M47812 Spondylosis without myelopathy or radiculopathy, cervical region: Secondary | ICD-10-CM | POA: Diagnosis not present

## 2016-11-20 DIAGNOSIS — Z6831 Body mass index (BMI) 31.0-31.9, adult: Secondary | ICD-10-CM | POA: Diagnosis not present

## 2016-11-24 ENCOUNTER — Other Ambulatory Visit: Payer: Self-pay | Admitting: Family Medicine

## 2016-11-24 DIAGNOSIS — F330 Major depressive disorder, recurrent, mild: Secondary | ICD-10-CM | POA: Diagnosis not present

## 2016-11-24 NOTE — Telephone Encounter (Signed)
Your patient 

## 2016-12-15 NOTE — L&D Delivery Note (Signed)
Delivery Summary for Deborah Fields  Labor Events:   Preterm labor:   Rupture date:   Rupture time:   Rupture type: Intact  Fluid Color:   Induction:   Augmentation:   Complications:   Cervical ripening:          Delivery:   Episiotomy:   Lacerations:   Repair suture:   Repair # of packets:   Blood loss (ml): 1000   Information for the patient's newborn:  [name], [name] [[id number]][[id number]]    Delivery 10/30/2017 8:22 AM by  C-Section, Low Transverse Sex:  female Gestational Age: [id number] Delivery Clinician:   Living?:         APGARS  One minute Five minutes Ten minutes  Skin color:        Heart rate:        Grimace:        Muscle tone:        Breathing:        Totals: 7  9      Presentation/position:      Resuscitation:   Cord information:    Disposition of cord blood:     Blood gases sent?  Complications:   Placenta: Delivered:       appearance Newborn Measurements: Weight: 8 lb 9.6 oz (3900 g)  Height: 21.06"  Head circumference:    Chest circumference:    Other providers:    Additional  information: Forceps:   Vacuum:   Breech:   Observed anomalies        See Dr. Oretha Milchherry's operative note for details of C-section procedure.   Hildred Laserherry, Larae Caison, MD [facility]

## 2017-01-05 DIAGNOSIS — E041 Nontoxic single thyroid nodule: Secondary | ICD-10-CM | POA: Diagnosis not present

## 2017-01-06 DIAGNOSIS — F330 Major depressive disorder, recurrent, mild: Secondary | ICD-10-CM | POA: Diagnosis not present

## 2017-01-09 ENCOUNTER — Other Ambulatory Visit: Payer: Self-pay | Admitting: Otolaryngology

## 2017-01-09 DIAGNOSIS — E041 Nontoxic single thyroid nodule: Secondary | ICD-10-CM

## 2017-01-13 ENCOUNTER — Other Ambulatory Visit: Payer: Self-pay | Admitting: Unknown Physician Specialty

## 2017-01-16 DIAGNOSIS — E041 Nontoxic single thyroid nodule: Secondary | ICD-10-CM | POA: Diagnosis not present

## 2017-02-03 DIAGNOSIS — F330 Major depressive disorder, recurrent, mild: Secondary | ICD-10-CM | POA: Diagnosis not present

## 2017-02-04 DIAGNOSIS — F330 Major depressive disorder, recurrent, mild: Secondary | ICD-10-CM | POA: Diagnosis not present

## 2017-02-04 DIAGNOSIS — F410 Panic disorder [episodic paroxysmal anxiety] without agoraphobia: Secondary | ICD-10-CM | POA: Diagnosis not present

## 2017-02-04 DIAGNOSIS — F411 Generalized anxiety disorder: Secondary | ICD-10-CM | POA: Diagnosis not present

## 2017-02-17 DIAGNOSIS — F330 Major depressive disorder, recurrent, mild: Secondary | ICD-10-CM | POA: Diagnosis not present

## 2017-02-28 ENCOUNTER — Other Ambulatory Visit: Payer: Self-pay | Admitting: Gastroenterology

## 2017-03-04 ENCOUNTER — Other Ambulatory Visit: Payer: Self-pay

## 2017-03-04 ENCOUNTER — Telehealth: Payer: Self-pay | Admitting: Gastroenterology

## 2017-03-04 MED ORDER — DEXLANSOPRAZOLE 60 MG PO CPDR: 60.0000 mg | DELAYED_RELEASE_CAPSULE | Freq: Every day | ORAL | 11 refills | Status: DC

## 2017-03-04 NOTE — Telephone Encounter (Signed)
No refill was ever received from [facility] for a refill. This has been sent to pt's pharmacy per her request.

## 2017-03-04 NOTE — Telephone Encounter (Signed)
Patient is waiting on a refill for [facility]ilant from [facility] [address] They told her they sent it twice. If you need to call her [phone]

## 2017-03-20 ENCOUNTER — Encounter: Payer: Self-pay | Admitting: Certified Nurse Midwife

## 2017-03-20 ENCOUNTER — Telehealth: Payer: Self-pay | Admitting: Certified Nurse Midwife

## 2017-03-20 ENCOUNTER — Ambulatory Visit (INDEPENDENT_AMBULATORY_CARE_PROVIDER_SITE_OTHER): Payer: BLUE CROSS/BLUE SHIELD | Admitting: Certified Nurse Midwife

## 2017-03-20 VITALS — BP 117/65 | HR 94 | Ht 67.0 in | Wt 201.0 lb

## 2017-03-20 DIAGNOSIS — N926 Irregular menstruation, unspecified: Secondary | ICD-10-CM

## 2017-03-20 LAB — POCT URINE PREGNANCY: Preg Test, Ur: POSITIVE — AB

## 2017-03-20 MED ORDER — DOXYLAMINE-PYRIDOXINE 10-10 MG PO TBEC: DELAYED_RELEASE_TABLET | ORAL | 3 refills | Status: DC

## 2017-03-20 NOTE — Progress Notes (Signed)
Subjective:    Deborah Fields is a 31 y.o. female who presents for evaluation of amenorrhea. She believes she could be pregnant. unplaned but desired Sexual Activity: single partner, contraception: OCP (estrogen/progesterone). She admits to missing doses of her birth control pill in March.  Current symptoms also include: nausea. Last period was normal.   Patient's last menstrual period was 11/02/2016 (exact date). The following portions of the patient's history were reviewed and updated as appropriate: allergies, current medications, past family history, past medical history, past social history, past surgical history and problem list.  Review of Systems Pertinent items noted in HPI and remainder of comprehensive ROS otherwise negative.     Objective:    BP 117/65   Pulse 94   Ht  (1.702 m)   Wt 201 lb (91.2 kg)   LMP 11/02/2016 (Exact Date)   BMI 31.48 kg/m  General: alert, cooperative, appears stated age and no acute distress    Lab Review Urine HCG: positive    Assessment:    Absence of menstruation.     Plan:    Pregnancy Test:Positive: EDC: unknown. Briefly discussed pre-natal care options.Encouraged well-balanced diet, plenty of rest when needed, pre-natal vitamins daily and walking for exercise. Discussed self-help for nausea, avoiding OTC medications until consulting provider or pharmacist, other than Tylenol as needed. Reviewed use of zoloft with pregnancy and Dexilant. Diclegis order for nausea. Encouraged minimal caffeine (1-2 cups daily) and avoiding alcohol. Ultrasound for dating and viability as soon as possible.  She will schedule her initial OB nurse visit at 8-10 wks and NOB physical at 12 wks. . Feel free to call with any questions.   Doreene Burke, CNM

## 2017-03-20 NOTE — Telephone Encounter (Signed)
My chart message for instruction on Diclegis sent  Doreene Burke CNM

## 2017-03-20 NOTE — Progress Notes (Signed)
Pt states she had 4 positive home pregnancy tests.Pt has very irregular periods and is unsure of LMP. States she had a period near [facility]. On zoloft but advised to decrease to  QD.

## 2017-03-20 NOTE — Patient Instructions (Signed)

## 2017-03-24 ENCOUNTER — Other Ambulatory Visit: Payer: Self-pay | Admitting: Certified Nurse Midwife

## 2017-03-24 DIAGNOSIS — Z369 Encounter for antenatal screening, unspecified: Secondary | ICD-10-CM

## 2017-03-25 DIAGNOSIS — F330 Major depressive disorder, recurrent, mild: Secondary | ICD-10-CM | POA: Diagnosis not present

## 2017-03-27 ENCOUNTER — Ambulatory Visit (INDEPENDENT_AMBULATORY_CARE_PROVIDER_SITE_OTHER): Payer: BLUE CROSS/BLUE SHIELD

## 2017-03-27 ENCOUNTER — Encounter: Payer: Self-pay | Admitting: Advanced Practice Midwife

## 2017-03-27 DIAGNOSIS — Z369 Encounter for antenatal screening, unspecified: Secondary | ICD-10-CM

## 2017-04-02 ENCOUNTER — Ambulatory Visit (INDEPENDENT_AMBULATORY_CARE_PROVIDER_SITE_OTHER): Payer: BLUE CROSS/BLUE SHIELD | Admitting: Obstetrics and Gynecology

## 2017-04-02 VITALS — BP 102/68 | HR 84 | Ht 67.0 in | Wt 204.2 lb

## 2017-04-02 DIAGNOSIS — Z113 Encounter for screening for infections with a predominantly sexual mode of transmission: Secondary | ICD-10-CM | POA: Diagnosis not present

## 2017-04-02 DIAGNOSIS — Z3401 Encounter for supervision of normal first pregnancy, first trimester: Secondary | ICD-10-CM | POA: Diagnosis not present

## 2017-04-02 NOTE — Progress Notes (Signed)
Deborah Fields presents for NOB nurse interview visit. Pregnancy confirmation done at [facility].  G2 .  P1 . Pregnancy education material explained and given. Cats in the home, pt aware that she cannot change liter box, states that husband will change liter PNV encouraged. 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 Genetic screening options discussed. Genetic testing: Ordered, pt to return in 1 week for MaternIT. Pt has chosen doctor line of service due to h/o c-section. Pt states that she would like to VBAC if possible.  Pt may discuss with provider. Pt. To follow up with provider in 3 weeks for NOB physical.  All questions answered.

## 2017-04-02 NOTE — Patient Instructions (Signed)
Common Medications Safe in Pregnancy  Acne:      Constipation:  Benzoyl Peroxide     Colace  Clindamycin      Dulcolax Suppository  Topica Erythromycin     Fibercon  Salicylic Acid      Metamucil         Miralax AVOID:        Senakot   Accutane    Cough:  Retin-A       Cough Drops  Tetracycline      Phenergan w/ Codeine if Rx  Minocycline      Robitussin (Plain & DM)  Antibiotics:     Crabs/Lice:  Ceclor       RID  Cephalosporins    AVOID:  E-Mycins      Kwell  Keflex  Macrobid/Macrodantin   Diarrhea:  Penicillin      Kao-Pectate  Zithromax      Imodium AD         PUSH FLUIDS AVOID:       Cipro     Fever:  Tetracycline      Tylenol (Regular or Extra  Minocycline       Strength)  Levaquin      Extra Strength-Do not          Exceed 8 tabs/24 hrs Caffeine:        <200mg/day (equiv. To 1 cup of coffee or  approx. 3 12 oz sodas)         Gas: Cold/Hayfever:       Gas-X  Benadryl      Mylicon  Claritin       Phazyme  **Claritin-D        Chlor-Trimeton    Headaches:  Dimetapp      ASA-Free Excedrin  Drixoral-Non-Drowsy     Cold Compress  Mucinex (Guaifenasin)     Tylenol (Regular or Extra  Sudafed/Sudafed-12 Hour     Strength)  **Sudafed PE Pseudoephedrine   Tylenol Cold & Sinus     Vicks Vapor Rub  Zyrtec  **AVOID if Problems With Blood Pressure         Heartburn: Avoid lying down for at least 1 hour after meals  Aciphex      Maalox     Rash:  Milk of Magnesia     Benadryl    Mylanta       1% Hydrocortisone Cream  Pepcid  Pepcid Complete   Sleep Aids:  Prevacid      Ambien   Prilosec       Benadryl  Rolaids       Chamomile Tea  Tums (Limit 4/day)     Unisom  Zantac       Tylenol PM         Warm milk-add vanilla or  Hemorrhoids:       Sugar for taste  Anusol/Anusol H.C.  (RX: Analapram 2.5%)  Sugar Substitutes:  Hydrocortisone OTC     Ok in moderation  Preparation H      Tucks        Vaseline lotion applied to tissue with  wiping    Herpes:     Throat:  Acyclovir      Oragel  Famvir  Valtrex     Vaccines:         Flu Shot Leg Cramps:       *Gardasil  Benadryl      Hepatitis A         Hepatitis B Nasal Spray:         Pneumovax  Saline Nasal Spray     Polio Booster         Tetanus Nausea:       Tuberculosis test or PPD  Vitamin B6 25 mg TID   AVOID:    Dramamine      *Gardasil  Emetrol       Live Poliovirus  Ginger Root 250 mg QID    MMR (measles, mumps &  High Complex Carbs @ Bedtime    rebella)  Sea Bands-Accupressure    Varicella (Chickenpox)  Unisom 1/2 tab TID     *No known complications           If received before Pain:         Known pregnancy;   Darvocet       Resume series after  Lortab        Delivery  Percocet    Yeast:   Tramadol      Femstat  Tylenol 3      Gyne-lotrimin  Ultram       Monistat  Vicodin           MISC:         All Sunscreens           Hair Coloring/highlights          Insect Repellant's          (Including DEET)         Mystic Tans First Trimester of Pregnancy The first trimester of pregnancy is from week 1 until the end of week 13 (months 1 through 3). During this time, your baby will begin to develop inside you. At 6-8 weeks, the eyes and face are formed, and the heartbeat can be seen on ultrasound. At the end of 12 weeks, all the baby's organs are formed. Prenatal care is all the medical care you receive before the birth of your baby. Make sure you get good prenatal care and follow all of your doctor's instructions. Follow these instructions at home: Medicines  Take over-the-counter and prescription medicines only as told by your doctor. Some medicines are safe and some medicines are not safe during pregnancy.  Take a prenatal vitamin that contains at least 600 micrograms (mcg) of folic acid.  If you have trouble pooping (constipation), take medicine that will make your stool soft (stool softener) if your doctor approves. Eating and drinking  Eat regular,  healthy meals.  Your doctor will tell you the amount of weight gain that is right for you.  Avoid raw meat and uncooked cheese.  If you feel sick to your stomach (nauseous) or throw up (vomit): ? Eat 4 or 5 small meals a day instead of 3 large meals. ? Try eating a few soda crackers. ? Drink liquids between meals instead of during meals.  To prevent constipation: ? Eat foods that are high in fiber, like fresh fruits and vegetables, whole grains, and beans. ? Drink enough fluids to keep your pee (urine) clear or pale yellow. Activity  Exercise only as told by your doctor. Stop exercising if you have cramps or pain in your lower belly (abdomen) or low back.  Do not exercise if it is too hot, too humid, or if you are in a place of great height (high altitude).  Try to avoid standing for long periods of time. Move your legs often if you must stand in one place for a long time.  Avoid heavy lifting.  Wear low-heeled shoes. Sit and stand   up straight.  You can have sex unless your doctor tells you not to. Relieving pain and discomfort  Wear a good support bra if your breasts are sore.  Take warm water baths (sitz baths) to soothe pain or discomfort caused by hemorrhoids. Use hemorrhoid cream if your doctor says it is okay.  Rest with your legs raised if you have leg cramps or low back pain.  If you have puffy, bulging veins (varicose veins) in your legs: ? Wear support hose or compression stockings as told by your doctor. ? Raise (elevate) your feet for 15 minutes, 3-4 times a day. ? Limit salt in your food. Prenatal care  Schedule your prenatal visits by the twelfth week of pregnancy.  Write down your questions. Take them to your prenatal visits.  Keep all your prenatal visits as told by your doctor. This is important. Safety  Wear your seat belt at all times when driving.  Make a list of emergency phone numbers. The list should include numbers for family, friends, the  hospital, and police and fire departments. General instructions  Ask your doctor for a referral to a local prenatal class. Begin classes no later than at the start of month 6 of your pregnancy.  Ask for help if you need counseling or if you need help with nutrition. Your doctor can give you advice or tell you where to go for help.  Do not use hot tubs, steam rooms, or saunas.  Do not douche or use tampons or scented sanitary pads.  Do not cross your legs for long periods of time.  Avoid all herbs and alcohol. Avoid drugs that are not approved by your doctor.  Do not use any tobacco products, including cigarettes, chewing tobacco, and electronic cigarettes. If you need help quitting, ask your doctor. You may get counseling or other support to help you quit.  Avoid cat litter boxes and soil used by cats. These carry germs that can cause birth defects in the baby and can cause a loss of your baby (miscarriage) or stillbirth.  Visit your dentist. At home, brush your teeth with a soft toothbrush. Be gentle when you floss. Contact a doctor if:  You are dizzy.  You have mild cramps or pressure in your lower belly.  You have a nagging pain in your belly area.  You continue to feel sick to your stomach, you throw up, or you have watery poop (diarrhea).  You have a bad smelling fluid coming from your vagina.  You have pain when you pee (urinate).  You have increased puffiness (swelling) in your face, hands, legs, or ankles. Get help right away if:  You have a fever.  You are leaking fluid from your vagina.  You have spotting or bleeding from your vagina.  You have very bad belly cramping or pain.  You gain or lose weight rapidly.  You throw up blood. It may look like coffee grounds.  You are around people who have German measles, fifth disease, or chickenpox.  You have a very bad headache.  You have shortness of breath.  You have any kind of trauma, such as from a fall or  a car accident. Summary  The first trimester of pregnancy is from week 1 until the end of week 13 (months 1 through 3).  To take care of yourself and your unborn baby, you will need to eat healthy meals, take medicines only if your doctor tells you to do so, and do activities that   are safe for you and your baby.  Keep all follow-up visits as told by your doctor. This is important as your doctor will have to ensure that your baby is healthy and growing well. This information is not intended to replace advice given to you by your health care provider. Make sure you discuss any questions you have with your health care provider. Document Released: 05/19/2008 Document Revised: 12/09/2016 Document Reviewed: 12/09/2016 Elsevier Interactive Patient Education  2017 [employer]  

## 2017-04-03 LAB — HEMOGLOBIN A1C
ESTIMATED AVERAGE GLUCOSE: 91 mg/dL
HEMOGLOBIN A1C: 4.8 % (ref 4.8–5.6)

## 2017-04-03 LAB — URINALYSIS, ROUTINE W REFLEX MICROSCOPIC
Bilirubin, UA: NEGATIVE
GLUCOSE, UA: NEGATIVE
Ketones, UA: NEGATIVE
Nitrite, UA: NEGATIVE
PH UA: 6 (ref 5.0–7.5)
PROTEIN UA: NEGATIVE
RBC, UA: NEGATIVE
Specific Gravity, UA: >=1.03 — AB (ref 1.005–1.030)
Urobilinogen, Ur: 0.2 mg/dL (ref 0.2–1.0)

## 2017-04-03 LAB — CBC WITH DIFFERENTIAL/PLATELET
Basophils Absolute: 0 10*3/uL (ref 0.0–0.2)
Basos: 0 %
EOS (ABSOLUTE): 0.2 10*3/uL (ref 0.0–0.4)
EOS: 2 %
HEMATOCRIT: 37.3 % (ref 34.0–46.6)
HEMOGLOBIN: 12.7 g/dL (ref 11.1–15.9)
Immature Grans (Abs): 0 10*3/uL (ref 0.0–0.1)
Immature Granulocytes: 0 %
LYMPHS ABS: 2.2 10*3/uL (ref 0.7–3.1)
Lymphs: 24 %
MCH: 29.3 pg (ref 26.6–33.0)
MCHC: 34 g/dL (ref 31.5–35.7)
MCV: 86 fL (ref 79–97)
MONOS ABS: 0.4 10*3/uL (ref 0.1–0.9)
Monocytes: 4 %
NEUTROS ABS: 6.3 10*3/uL (ref 1.4–7.0)
Neutrophils: 70 %
Platelets: 267 10*3/uL (ref 150–379)
RBC: 4.34 x10E6/uL (ref 3.77–5.28)
RDW: 13.1 % (ref 12.3–15.4)
WBC: 9.1 10*3/uL (ref 3.4–10.8)

## 2017-04-03 LAB — VARICELLA ZOSTER ANTIBODY, IGG: Varicella zoster IgG: 1234 index (ref >165)

## 2017-04-03 LAB — ANTIBODY SCREEN: ANTIBODY SCREEN: NEGATIVE

## 2017-04-03 LAB — THYROID PANEL WITH TSH
FREE THYROXINE INDEX: 1.6 (ref 1.2–4.9)
T3 UPTAKE RATIO: 21 % — AB (ref 24–39)
T4 TOTAL: 7.8 ug/dL (ref 4.5–12.0)
TSH: 0.712 u[IU]/mL (ref 0.450–4.500)

## 2017-04-03 LAB — RUBELLA SCREEN: Rubella Antibodies, IGG: 2.46 index (ref >0.99)

## 2017-04-03 LAB — MICROSCOPIC EXAMINATION: CASTS: NONE SEEN /LPF

## 2017-04-03 LAB — HIV ANTIBODY (ROUTINE TESTING W REFLEX): HIV SCREEN 4TH GENERATION: NONREACTIVE

## 2017-04-03 LAB — OB RESULTS CONSOLE VARICELLA ZOSTER ANTIBODY, IGG: VARICELLA IGG: IMMUNE

## 2017-04-03 LAB — HEPATITIS B SURFACE ANTIGEN: Hepatitis B Surface Ag: NEGATIVE

## 2017-04-03 LAB — ABO AND RH: Rh Factor: NEGATIVE

## 2017-04-03 LAB — RPR: RPR Ser Ql: NONREACTIVE

## 2017-04-04 LAB — URINE CULTURE

## 2017-04-07 ENCOUNTER — Encounter: Payer: Self-pay | Admitting: Obstetrics and Gynecology

## 2017-04-08 ENCOUNTER — Other Ambulatory Visit: Payer: Self-pay | Admitting: Certified Nurse Midwife

## 2017-04-09 ENCOUNTER — Other Ambulatory Visit: Payer: Self-pay | Admitting: Obstetrics and Gynecology

## 2017-04-09 ENCOUNTER — Other Ambulatory Visit: Payer: BLUE CROSS/BLUE SHIELD

## 2017-04-09 DIAGNOSIS — Z1379 Encounter for other screening for genetic and chromosomal anomalies: Secondary | ICD-10-CM | POA: Diagnosis not present

## 2017-04-09 DIAGNOSIS — Z3491 Encounter for supervision of normal pregnancy, unspecified, first trimester: Secondary | ICD-10-CM | POA: Diagnosis not present

## 2017-04-13 ENCOUNTER — Encounter: Payer: Self-pay | Admitting: Obstetrics and Gynecology

## 2017-04-13 DIAGNOSIS — Z20828 Contact with and (suspected) exposure to other viral communicable diseases: Secondary | ICD-10-CM

## 2017-04-13 MED ORDER — ONDANSETRON HCL 4 MG PO TABS: 4.0000 mg | ORAL_TABLET | Freq: Three times a day (TID) | ORAL | 2 refills | Status: DC | PRN

## 2017-04-13 NOTE — Telephone Encounter (Signed)
Deborah Fields,   1.  How long ago was this exposure to the sick child?  I'm fairly certain that you were not exposed to the virus as you were only around the child for ~ 5 minutes and practiced good hand hygiene, however I would recommend checking your virus titers in the next few days to be sure.  Just call the office to have them add you to the lab schedule.    2.  Yes, if Diclegis is not helping and makes you drowsy during the day, I can prescribe Zofran for you to take until you get over the hump.  You should be able to pick it up at your pharmacy today.

## 2017-04-14 DIAGNOSIS — F330 Major depressive disorder, recurrent, mild: Secondary | ICD-10-CM | POA: Diagnosis not present

## 2017-04-15 ENCOUNTER — Ambulatory Visit: Payer: BLUE CROSS/BLUE SHIELD

## 2017-04-15 ENCOUNTER — Other Ambulatory Visit: Payer: Self-pay

## 2017-04-15 ENCOUNTER — Other Ambulatory Visit: Payer: BLUE CROSS/BLUE SHIELD

## 2017-04-15 DIAGNOSIS — Z1379 Encounter for other screening for genetic and chromosomal anomalies: Secondary | ICD-10-CM

## 2017-04-15 DIAGNOSIS — Z20828 Contact with and (suspected) exposure to other viral communicable diseases: Secondary | ICD-10-CM | POA: Diagnosis not present

## 2017-04-16 ENCOUNTER — Encounter: Payer: Self-pay | Admitting: Obstetrics and Gynecology

## 2017-04-16 LAB — PARVOVIRUS B19 ANTIBODY, IGG AND IGM
PARVOVIRUS B19 IGG: 3.9 {index} — AB (ref 0.0–0.8)
PARVOVIRUS B19 IGM: 0.1 {index} (ref 0.0–0.8)

## 2017-04-20 DIAGNOSIS — D2372 Other benign neoplasm of skin of left lower limb, including hip: Secondary | ICD-10-CM | POA: Diagnosis not present

## 2017-04-21 LAB — MATERNIT 21 PLUS CORE, BLOOD
CHROMOSOME 13: NEGATIVE
CHROMOSOME 18: NEGATIVE
CHROMOSOME 21: NEGATIVE
Y Chromosome: NOT DETECTED

## 2017-04-21 LAB — SPECIMEN STATUS REPORT

## 2017-04-22 ENCOUNTER — Telehealth: Payer: Self-pay

## 2017-04-22 NOTE — Telephone Encounter (Signed)
Called pt no answer. LM for pt informing her of negative genetic results. Advised pt to call back for sex of the baby.

## 2017-04-22 NOTE — Telephone Encounter (Signed)
-----   Message from Hildred LaserAnika Cherry, MD sent at 04/21/2017  8:15 PM EDT ----- Please inform of normal MaterniT21 screen, is female infant if desires to know the sex.

## 2017-04-23 ENCOUNTER — Encounter: Payer: BLUE CROSS/BLUE SHIELD | Admitting: Obstetrics and Gynecology

## 2017-04-24 ENCOUNTER — Ambulatory Visit (INDEPENDENT_AMBULATORY_CARE_PROVIDER_SITE_OTHER): Payer: BLUE CROSS/BLUE SHIELD | Admitting: Obstetrics and Gynecology

## 2017-04-24 VITALS — BP 106/69 | HR 100 | Wt 205.6 lb

## 2017-04-24 DIAGNOSIS — F329 Major depressive disorder, single episode, unspecified: Secondary | ICD-10-CM

## 2017-04-24 DIAGNOSIS — Z3481 Encounter for supervision of other normal pregnancy, first trimester: Secondary | ICD-10-CM

## 2017-04-24 DIAGNOSIS — O09899 Supervision of other high risk pregnancies, unspecified trimester: Secondary | ICD-10-CM

## 2017-04-24 DIAGNOSIS — Z6791 Unspecified blood type, Rh negative: Secondary | ICD-10-CM | POA: Insufficient documentation

## 2017-04-24 DIAGNOSIS — F419 Anxiety disorder, unspecified: Secondary | ICD-10-CM

## 2017-04-24 DIAGNOSIS — F32A Depression, unspecified: Secondary | ICD-10-CM | POA: Insufficient documentation

## 2017-04-24 DIAGNOSIS — O26899 Other specified pregnancy related conditions, unspecified trimester: Secondary | ICD-10-CM | POA: Insufficient documentation

## 2017-04-24 DIAGNOSIS — J4520 Mild intermittent asthma, uncomplicated: Secondary | ICD-10-CM

## 2017-04-24 DIAGNOSIS — Z98891 History of uterine scar from previous surgery: Secondary | ICD-10-CM | POA: Insufficient documentation

## 2017-04-24 DIAGNOSIS — O99210 Obesity complicating pregnancy, unspecified trimester: Secondary | ICD-10-CM | POA: Insufficient documentation

## 2017-04-24 LAB — POCT URINALYSIS DIPSTICK
Bilirubin, UA: NEGATIVE
GLUCOSE UA: NEGATIVE
Ketones, UA: NEGATIVE
Leukocytes, UA: NEGATIVE
NITRITE UA: NEGATIVE
RBC UA: NEGATIVE
Spec Grav, UA: >=1.03 — AB (ref 1.010–1.025)
UROBILINOGEN UA: 0.2 U/dL
pH, UA: 6 (ref 5.0–8.0)

## 2017-04-24 NOTE — Patient Instructions (Addendum)
Second Trimester of Pregnancy The second trimester is from week 14 through week 27 (months 4 through 6). The second trimester is often a time when you feel your best. Your body has adjusted to being pregnant, and you begin to feel better physically. Usually, morning sickness has lessened or quit completely, you may have more energy, and you may have an increase in appetite. The second trimester is also a time when the fetus is growing rapidly. At the end of the sixth month, the fetus is about 9 inches long and weighs about 1 pounds. You will likely begin to feel the baby move (quickening) between 16 and 20 weeks of pregnancy. Body changes during your second trimester Your body continues to go through many changes during your second trimester. The changes vary from woman to woman.  Your weight will continue to increase. You will notice your lower abdomen bulging out.  You may begin to get stretch marks on your hips, abdomen, and breasts.  You may develop headaches that can be relieved by medicines. The medicines should be approved by your health care provider.  You may urinate more often because the fetus is pressing on your bladder.  You may develop or continue to have heartburn as a result of your pregnancy.  You may develop constipation because certain hormones are causing the muscles that push waste through your intestines to slow down.  You may develop hemorrhoids or swollen, bulging veins (varicose veins).  You may have back pain. This is caused by:  Weight gain.  Pregnancy hormones that are relaxing the joints in your pelvis.  A shift in weight and the muscles that support your balance.  Your breasts will continue to grow and they will continue to become tender.  Your gums may bleed and may be sensitive to brushing and flossing.  Dark spots or blotches (chloasma, mask of pregnancy) may develop on your face. This will likely fade after the baby is born.  A dark line from your  belly button to the pubic area (linea nigra) may appear. This will likely fade after the baby is born.  You may have changes in your hair. These can include thickening of your hair, rapid growth, and changes in texture. Some women also have hair loss during or after pregnancy, or hair that feels dry or thin. Your hair will most likely return to normal after your baby is born. What to expect at prenatal visits During a routine prenatal visit:  You will be weighed to make sure you and the fetus are growing normally.  Your blood pressure will be taken.  Your abdomen will be measured to track your baby's growth.  The fetal heartbeat will be listened to.  Any test results from the previous visit will be discussed. Your health care provider may ask you:  How you are feeling.  If you are feeling the baby move.  If you have had any abnormal symptoms, such as leaking fluid, bleeding, severe headaches, or abdominal cramping.  If you are using any tobacco products, including cigarettes, chewing tobacco, and electronic cigarettes.  If you have any questions. Other tests that may be performed during your second trimester include:  Blood tests that check for:  Low iron levels (anemia).  High blood sugar that affects pregnant women (gestational diabetes) between 24 and 28 weeks.  Rh antibodies. This is to check for a protein on red blood cells (Rh factor).  Urine tests to check for infections, diabetes, or protein in the   urine.  An ultrasound to confirm the proper growth and development of the baby.  An amniocentesis to check for possible genetic problems.  Fetal screens for spina bifida and Down syndrome.  HIV (human immunodeficiency virus) testing. Routine prenatal testing includes screening for HIV, unless you choose not to have this test. Follow these instructions at home: Medicines   Follow your health care provider's instructions regarding medicine use. Specific medicines may  be either safe or unsafe to take during pregnancy.  Take a prenatal vitamin that contains at least 600 micrograms (mcg) of folic acid.  If you develop constipation, try taking a stool softener if your health care provider approves. Eating and drinking   Eat a balanced diet that includes fresh fruits and vegetables, whole grains, good sources of protein such as meat, eggs, or tofu, and low-fat dairy. Your health care provider will help you determine the amount of weight gain that is right for you.  Avoid raw meat and uncooked cheese. These carry germs that can cause birth defects in the baby.  If you have low calcium intake from food, talk to your health care provider about whether you should take a daily calcium supplement.  Limit foods that are high in fat and processed sugars, such as fried and sweet foods.  To prevent constipation:  Drink enough fluid to keep your urine clear or pale yellow.  Eat foods that are high in fiber, such as fresh fruits and vegetables, whole grains, and beans. Activity   Exercise only as directed by your health care provider. Most women can continue their usual exercise routine during pregnancy. Try to exercise for 30 minutes at least 5 days a week. Stop exercising if you experience uterine contractions.  Avoid heavy lifting, wear low heel shoes, and practice good posture.  A sexual relationship may be continued unless your health care provider directs you otherwise. Relieving pain and discomfort   Wear a good support bra to prevent discomfort from breast tenderness.  Take warm sitz baths to soothe any pain or discomfort caused by hemorrhoids. Use hemorrhoid cream if your health care provider approves.  Rest with your legs elevated if you have leg cramps or low back pain.  If you develop varicose veins, wear support hose. Elevate your feet for 15 minutes, 3-4 times a day. Limit salt in your diet. Prenatal Care   Write down your questions. Take them  to your prenatal visits.  Keep all your prenatal visits as told by your health care provider. This is important. Safety   Wear your seat belt at all times when driving.  Make a list of emergency phone numbers, including numbers for family, friends, the hospital, and police and fire departments. General instructions   Ask your health care provider for a referral to a local prenatal education class. Begin classes no later than the beginning of month 6 of your pregnancy.  Ask for help if you have counseling or nutritional needs during pregnancy. Your health care provider can offer advice or refer you to specialists for help with various needs.  Do not use hot tubs, steam rooms, or saunas.  Do not douche or use tampons or scented sanitary pads.  Do not cross your legs for long periods of time.  Avoid cat litter boxes and soil used by cats. These carry germs that can cause birth defects in the baby and possibly loss of the fetus by miscarriage or stillbirth.  Avoid all smoking, herbs, alcohol, and unprescribed drugs. Chemicals in   these products can affect the formation and growth of the baby.  Do not use any products that contain nicotine or tobacco, such as cigarettes and e-cigarettes. If you need help quitting, ask your health care provider.  Visit your dentist if you have not gone yet during your pregnancy. Use a soft toothbrush to brush your teeth and be gentle when you floss. Contact a health care provider if:  You have dizziness.  You have mild pelvic cramps, pelvic pressure, or nagging pain in the abdominal area.  You have persistent nausea, vomiting, or diarrhea.  You have a bad smelling vaginal discharge.  You have pain when you urinate. Get help right away if:  You have a fever.  You are leaking fluid from your vagina.  You have spotting or bleeding from your vagina.  You have severe abdominal cramping or pain.  You have rapid weight gain or weight loss.  You  have shortness of breath with chest pain.  You notice sudden or extreme swelling of your face, hands, ankles, feet, or legs.  You have not felt your baby move in over an hour.  You have severe headaches that do not go away when you take medicine.  You have vision changes. Summary  The second trimester is from week 14 through week 27 (months 4 through 6). It is also a time when the fetus is growing rapidly.  Your body goes through many changes during pregnancy. The changes vary from woman to woman.  Avoid all smoking, herbs, alcohol, and unprescribed drugs. These chemicals affect the formation and growth your baby.  Do not use any tobacco products, such as cigarettes, chewing tobacco, and e-cigarettes. If you need help quitting, ask your health care provider.  Contact your health care provider if you have any questions. Keep all prenatal visits as told by your health care provider. This is important. This information is not intended to replace advice given to you by your health care provider. Make sure you discuss any questions you have with your health care provider. Document Released: 11/25/2001 Document Revised: 05/08/2016 Document Reviewed: 02/01/2013 Elsevier Interactive Patient Education  2017 [employer]    Vaginal Birth After Cesarean Delivery Vaginal birth after cesarean delivery (VBAC) is giving birth vaginally after previously delivering a baby by a cesarean. In the past, if a woman had a cesarean delivery, all births afterward would be done by cesarean delivery. This is no longer true. It can be safe for the mother to try a vaginal delivery after having a cesarean delivery. It is important to discuss VBAC with your health care provider early in the pregnancy so you can understand the risks, benefits, and options. It will give you time to decide what is best in your particular case. The final decision about whether to have a VBAC or repeat cesarean delivery should be between  you and your health care provider. Any changes in your health or your baby's health during your pregnancy may make it necessary to change your initial decision about VBAC. Women who plan to have a VBAC should check with their health care provider to be sure that:  The previous cesarean delivery was done with a low transverse uterine cut (incision) (not a vertical classical incision).  The birth canal is big enough for the baby.  There were no other operations on the uterus.  An electronic fetal monitor (EFM) will be on at all times during labor.  An operating room will be available and ready in case  an emergency cesarean delivery is needed.  A health care provider and surgical nursing staff will be available at all times during labor to be ready to do an emergency delivery cesarean if necessary.  An anesthesiologist will be present in case an emergency cesarean delivery is needed.  The nursery is prepared and has adequate personnel and necessary equipment available to care for the baby in case of an emergency cesarean delivery. Benefits of VBAC  Shorter stay in the hospital.  Avoidance of risks associated with cesarean delivery, such as:  Surgical complications, such as opening of the incision or hernia in the incision.  Injury to other organs.  Fever. This can occur if an infection develops after surgery. It can also occur as a reaction to the medicine given to make you numb during the surgery.  Less blood loss and need for blood transfusions.  Lower risk of blood clots and infection.  Shorter recovery.  Decreased risk for having to remove the uterus (hysterectomy).  Decreased risk for the placenta to completely or partially cover the opening of the uterus (placenta previa) with a future pregnancy.  Decrease risk in future labor and delivery. Risks of a VBAC  Tearing (rupture) of the uterus. This is occurs in less than 1% of VBACs. The risk of this happening is higher  if:  Steps are taken to begin the labor process (induce labor) or stimulate or strengthen contractions (augment labor).  Medicine is used to soften (ripen) the cervix.  Having to remove the uterus (hysterectomy) if it ruptures. VBAC should not be done if:  The previous cesarean delivery was done with a vertical (classical) or T-shaped incision or you do not know what kind of incision was made.  You had a ruptured uterus.  You have had certain types of surgery on your uterus, such as removal of uterine fibroids. Ask your health care provider about other types of surgeries that prevent you from having a VBAC.  You have certain medical or childbirth (obstetrical) problems.  There are problems with the baby.  You have had two previous cesarean deliveries and no vaginal deliveries. Other facts to know about VBAC:  It is safe to have an epidural anesthetic with VBAC.  It is safe to turn the baby from a breech position (attempt an external cephalic version).  It is safe to try a VBAC with twins.  VBAC may not be successful if your baby weights 8.8 lb (4 kg) or more. However, weight predictions are not always accurate and should not be used alone to decide if VBAC is right for you.  There is an increased failure rate if the time between the cesarean delivery and VBAC is less than 19 months.  Your health care provider may advise against a VBAC if you have preeclampsia (high blood pressure, protein in the urine, and swelling of face and extremities).  VBAC is often successful if you previously gave birth vaginally.  VBAC is often successful when the labor starts spontaneously before the due date.  Delivering a baby through a VBAC is similar to having a normal spontaneous vaginal delivery. This information is not intended to replace advice given to you by your health care provider. Make sure you discuss any questions you have with your health care provider. Document Released: 05/24/2007  Document Revised: 05/08/2016 Document Reviewed: 06/30/2013 Elsevier Interactive Patient Education  2017 [employer].

## 2017-04-24 NOTE — Progress Notes (Signed)
OBSTETRIC INITIAL PRENATAL VISIT  Subjective:    Deborah Fields is being seen today for her first obstetrical visit.  This is not a planned pregnancy. She is a G2P1001 female at [redacted]w[redacted]d gestation, Estimated Date of Delivery: 11/06/17 with patient's last menstrual period 11/02/2016 (inconsistent with 8 week sono). Her obstetrical history is significant for obesity and previous C-section x 1, and anxiety/depression. Relationship with FOB: spouse, living together. Patient does intend to breast feed. Pregnancy history fully reviewed.    Obstetric History   G2   P1   T1   P0   A0   L1    SAB0   TAB0   Ectopic0   Multiple0   Live Births1     # Outcome Date GA Lbr Len/2nd Weight Sex Delivery Anes PTL Lv  2 Current           1 Term 2012 [redacted]w[redacted]d  8 lb 7 oz (3.827 kg) F CS-Unspec Spinal  LIV      Gynecologic History:  Last pap smear was last fall.  Results were normal (performed at [facility]).  Denies h/o abnormal pap smears in the past.  Denies history of STIs.  Contraception: pills (Lo-Loestrin) on at [employer] of pregnancy.   Past Medical History:  Diagnosis Date  . Allergic rhinitis   . Anxiety    managed by Psych  . Asthma   . Depression    managed by psych  . Erosive esophagitis    seen on Colonoscopy  . Heartburn   . Hiatal hernia    shown on colonoscopy  . IBS (irritable bowel syndrome)    per colonoscopy in Feb 2015  . MRSA (methicillin resistant Staphylococcus aureus)   . Panic disorder without agoraphobia    managed by Psych     Family History  Problem Relation Age of Onset  . Diabetes Mother   . Hypertension Mother   . Hyperlipidemia Mother   . Heart disease Father   . Hypertension Father   . Hyperlipidemia Father   . COPD Maternal Grandmother   . COPD Maternal Grandfather   . Cancer Paternal Grandfather        prostate  . Stroke Neg Hx   . Kidney disease Neg Hx   . Bladder Cancer Neg Hx      Past Surgical History:  Procedure Laterality Date    . CESAREAN SECTION    . COLONOSCOPY  Feb 2015   showed IBS, Hiatal Hernia, Erosive reflux esophagitis  . ESOPHAGOGASTRODUODENOSCOPY    . OVARIAN CYST REMOVAL Right   . TONSILLECTOMY       Social History   Social History  . Marital status: Married    Spouse name: N/A  . Number of children: N/A  . Years of education: N/A   Occupational History  . Not on file.   Social History Main Topics  . Smoking status: Former Smoker    Packs/day: 0.50    Years: 5.00    Types: Cigarettes  . Smokeless tobacco: Never Used     Comment: quit 10 years  . Alcohol use No  . Drug use: No  . Sexual activity: Yes    Birth control/ protection: None     Comment: Pregnant    Other Topics Concern  . Not on file   Social History Narrative  . No narrative on file     Current Outpatient Prescriptions on File Prior to Visit  Medication Sig Dispense Refill  . albuterol (PROAIR HFA)  108 (90 Base) MCG/ACT inhaler Inhale 2 puffs into the lungs every 6 (six) hours as needed. Reported on 03/17/2016 1 Inhaler 2  . Doxylamine-Pyridoxine (DICLEGIS) 10-10 MG TBEC Day 1&2: 2 tab at bed Day 3 if symptoms persits-1 tab am, 2 tab pm Day 4 if symptoms persits- t tab am,  Tab afternoon, 2 tab bed 120 tablet 3  . fluticasone (FLONASE) 50 MCG/ACT nasal spray USE 2 SPRAYS IN BOTH NOSTRILS ONCE A DAY 16 g 12  . ondansetron (ZOFRAN) 4 MG tablet Take 1 tablet (4 mg total) by mouth every 8 (eight) hours as needed for nausea or vomiting. 30 tablet 2  . Probiotic Product (SOLUBLE FIBER/PROBIOTICS PO) Take by mouth.    . sertraline (ZOLOFT) 100 MG tablet Take 150 mg by mouth daily.   3  . dexlansoprazole (DEXILANT) 60 MG capsule Take 1 capsule (60 mg total) by mouth daily. (Patient not taking: Reported on 04/02/2017) 30 capsule 11   No current facility-administered medications on file prior to visit.      Allergies  Allergen Reactions  . Sulfa Antibiotics Rash     Review of Systems General:Not Present- Fever,  Weight Loss and Weight Gain. Skin:Not Present- Rash. HEENT:Not Present- Blurred Vision, Headache and Bleeding Gums. Respiratory:Not Present- Difficulty Breathing. Breast:Not Present- Breast Mass. Cardiovascular:Not Present- Chest Pain, Elevated Blood Pressure, Fainting / Blacking Out and Shortness of Breath. Gastrointestinal:Present - Nausea (controlled with diet). Not Present- Abdominal Pain, Constipation, Vomiting. Female Genitourinary:Not Present- Frequency, Painful Urination, Pelvic Pain, Vaginal Bleeding, Vaginal Discharge, Contractions, regular, Fetal Movements Decreased, Urinary Complaints and Vaginal Fluid. Musculoskeletal:Not Present- Back Pain and Leg Cramps. Neurological:Not Present- Dizziness. Psychiatric:Not Present- Depression.     Objective:   Blood pressure 106/69, pulse 100, weight 205 lb 9.6 oz (93.3 kg), last menstrual period 11/02/2016.  Body mass index is 32.2 kg/m.   General Appearance:    Alert, cooperative, no distress, appears stated age  Head:    Normocephalic, without obvious abnormality, atraumatic  Eyes:    PERRL, conjunctiva/corneas clear, EOM's intact, both eyes  Ears:    Normal external ear canals, both ears  Nose:   Nares normal, septum midline, mucosa normal, no drainage or sinus tenderness  Throat:   Lips, mucosa, and tongue normal; teeth and gums normal  Neck:   Supple, symmetrical, trachea midline, no adenopathy; thyroid: no enlargement/tenderness/nodules; no carotid bruit or JVD  Back:     Symmetric, no curvature, ROM normal, no CVA tenderness  Lungs:     Clear to auscultation bilaterally, respirations unlabored  Chest Wall:    No tenderness or deformity   Heart:    Regular rate and rhythm, S1 and S2 normal, no murmur, rub or gallop  Breast Exam:    No tenderness, masses, or nipple abnormality  Abdomen:     Soft, non-tender, bowel sounds active all four quadrants, no masses, no organomegaly.  FH 12.  FHT 162  Bpm.  Well healed, faint  Pfannenstiel incision scar.   Genitalia:    Pelvic:external genitalia normal, vagina without lesions, discharge, or tenderness, rectovaginal septum  normal. Cervix normal in appearance, no cervical motion tenderness, no adnexal masses or tenderness.  Pregnancy positive findings: uterine enlargement: 12 wk size, nontender.   Rectal:    Normal external sphincter.  No hemorrhoids appreciated. Internal exam not done.   Extremities:   Extremities normal, atraumatic, no cyanosis or edema  Pulses:   2+ and symmetric all extremities  Skin:   Skin color, texture, turgor normal, no  rashes or lesions  Lymph nodes:   Cervical, supraclavicular, and axillary nodes normal  Neurologic:   CNII-XII intact, normal strength, sensation and reflexes throughout       Assessment:    Pregnancy at 12 and 0/7 weeks   Obesity Prior C-section x 1 Anxiety and depression Rh negative  H/o asthma   Plan:   - Initial labs reviewed.  Pap smear up to date. Will review records from [facility] (received today). - Will need Rhogam at 28 weeks for Rh negative status.  - Prenatal vitamins encouraged. - Problem list reviewed and updated.   - New OB counseling:  The patient has been given an overview regarding routine prenatal care.  - Recommendations regarding diet, weight gain, and exercise in pregnancy were given. Will need early glucola due to obesity.  To perform next visit.  - Prenatal testing, optional genetic testing, and ultrasound use in pregnancy were reviewed.  Patient has had a normal MaterniT21 screen. - Benefits of Breast Feeding were discussed. The patient is encouraged to consider nursing her baby post partum. - Is currently taking Zoloft 150 mg daily for anxiety/depression (was previously on 200 mg but encouraged to decrease by her Psychiatrist at the onset of pregnancy). Will continue to try to wean to Zoloft to 100 mg.  Patient notes that her psychiatrist does not recommend that she completely come off the  medication.  Will continue on Zoloft throughout the pregnancy and monitor as indicated.  - Patient with h/o prior C-section x 1.  Desires to VBAC.  Discussed risks/benefits of repeat C-section vs VBAC.  Given handout to review.   - H/o asthma, currently on no meds.  Discussed how pregnancy can affect and exacerbate asthma. Will treat if symptoms arise.  - Follow up in 4 weeks.   50% of 30 min visit spent on counseling and coordination of care.     Hildred Laserherry, Sanah Kraska, MD [facility]

## 2017-05-13 DIAGNOSIS — F330 Major depressive disorder, recurrent, mild: Secondary | ICD-10-CM | POA: Diagnosis not present

## 2017-05-26 ENCOUNTER — Other Ambulatory Visit: Payer: BLUE CROSS/BLUE SHIELD

## 2017-05-26 ENCOUNTER — Encounter: Payer: Self-pay | Admitting: Obstetrics and Gynecology

## 2017-05-26 ENCOUNTER — Ambulatory Visit (INDEPENDENT_AMBULATORY_CARE_PROVIDER_SITE_OTHER): Payer: BLUE CROSS/BLUE SHIELD | Admitting: Obstetrics and Gynecology

## 2017-05-26 VITALS — BP 100/63 | HR 94 | Wt 206.9 lb

## 2017-05-26 DIAGNOSIS — Z3482 Encounter for supervision of other normal pregnancy, second trimester: Secondary | ICD-10-CM | POA: Diagnosis not present

## 2017-05-26 DIAGNOSIS — O99210 Obesity complicating pregnancy, unspecified trimester: Secondary | ICD-10-CM

## 2017-05-26 DIAGNOSIS — Z1379 Encounter for other screening for genetic and chromosomal anomalies: Secondary | ICD-10-CM

## 2017-05-26 DIAGNOSIS — G44209 Tension-type headache, unspecified, not intractable: Secondary | ICD-10-CM

## 2017-05-26 LAB — POCT URINALYSIS DIPSTICK
Bilirubin, UA: NEGATIVE
GLUCOSE UA: NEGATIVE
Ketones, UA: NEGATIVE
Leukocytes, UA: NEGATIVE
NITRITE UA: NEGATIVE
PH UA: 7.5 (ref 5.0–8.0)
Protein, UA: NEGATIVE
RBC UA: NEGATIVE
SPEC GRAV UA: 1.01 (ref 1.010–1.025)
UROBILINOGEN UA: 0.2 U/dL

## 2017-05-26 MED ORDER — BUTALBITAL-APAP-CAFFEINE 50-325-40 MG PO CAPS: 1.0000 | ORAL_CAPSULE | Freq: Four times a day (QID) | ORAL | 3 refills | Status: DC | PRN

## 2017-05-26 NOTE — Progress Notes (Signed)
ROB: Patient doing well, but complains of tension headaches.  Has tried Tylenol without relief. Prescribed Fioricet.  Patient for msAFP today.  RTC in 4 weeks, for anatomy scan at that time.

## 2017-05-27 LAB — GLUCOSE, 1 HOUR GESTATIONAL: GESTATIONAL DIABETES SCREEN: 115 mg/dL (ref 65–139)

## 2017-05-29 LAB — AFP, SERUM, OPEN SPINA BIFIDA
AFP MoM: 0.97
AFP VALUE AFPOSL: 26.6 ng/mL
Gest. Age on Collection Date: 16.1 weeks
MATERNAL AGE AT EDD: 33.4 a
OSBR Risk 1 IN: 10000
Test Results:: NEGATIVE
WEIGHT: 206 [lb_av]

## 2017-06-02 DIAGNOSIS — F411 Generalized anxiety disorder: Secondary | ICD-10-CM | POA: Diagnosis not present

## 2017-06-02 DIAGNOSIS — F330 Major depressive disorder, recurrent, mild: Secondary | ICD-10-CM | POA: Diagnosis not present

## 2017-06-02 DIAGNOSIS — F410 Panic disorder [episodic paroxysmal anxiety] without agoraphobia: Secondary | ICD-10-CM | POA: Diagnosis not present

## 2017-06-30 ENCOUNTER — Encounter: Payer: BLUE CROSS/BLUE SHIELD | Admitting: Obstetrics and Gynecology

## 2017-06-30 ENCOUNTER — Other Ambulatory Visit: Payer: BLUE CROSS/BLUE SHIELD

## 2017-07-09 ENCOUNTER — Ambulatory Visit (INDEPENDENT_AMBULATORY_CARE_PROVIDER_SITE_OTHER): Payer: BLUE CROSS/BLUE SHIELD | Admitting: Obstetrics and Gynecology

## 2017-07-09 ENCOUNTER — Ambulatory Visit (INDEPENDENT_AMBULATORY_CARE_PROVIDER_SITE_OTHER): Payer: BLUE CROSS/BLUE SHIELD

## 2017-07-09 ENCOUNTER — Encounter: Payer: Self-pay | Admitting: Obstetrics and Gynecology

## 2017-07-09 VITALS — BP 92/60 | HR 101 | Wt 214.2 lb

## 2017-07-09 DIAGNOSIS — Z3482 Encounter for supervision of other normal pregnancy, second trimester: Secondary | ICD-10-CM | POA: Diagnosis not present

## 2017-07-09 DIAGNOSIS — Z3492 Encounter for supervision of normal pregnancy, unspecified, second trimester: Secondary | ICD-10-CM

## 2017-07-09 DIAGNOSIS — Z98891 History of uterine scar from previous surgery: Secondary | ICD-10-CM

## 2017-07-09 NOTE — Progress Notes (Signed)
ROB:  Pt doping well.  Continues on zoloft 100mg .  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  Pt to consider options.  U/S = low lying placenta. - repeat U/S 28 weeks.  Needs 1 hr GCT with next visit.

## 2017-07-25 IMAGING — CT CT HEAD W/O CM
2 series · 14 of 30 positions shown, 16 images · non-contrast
Comparison: None

CLINICAL DATA: Hit head on table 3 days ago.  Persistent pain.

EXAM:
CT HEAD WITHOUT CONTRAST
TECHNIQUE: Contiguous axial images were obtained from the base of the skull
through the vertex without intravenous contrast.

[Series 2: head wo · axial · 0.40mm/px · z∈[-52,+48]mm · 6 of 30 slices shown, 8 images]
[im 5/30  brain]
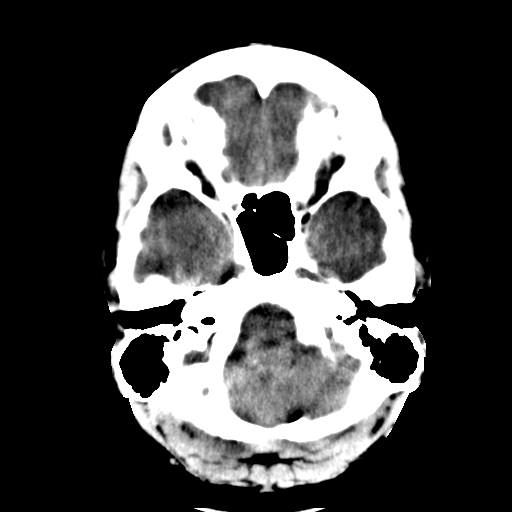
[im 5/30  bone]
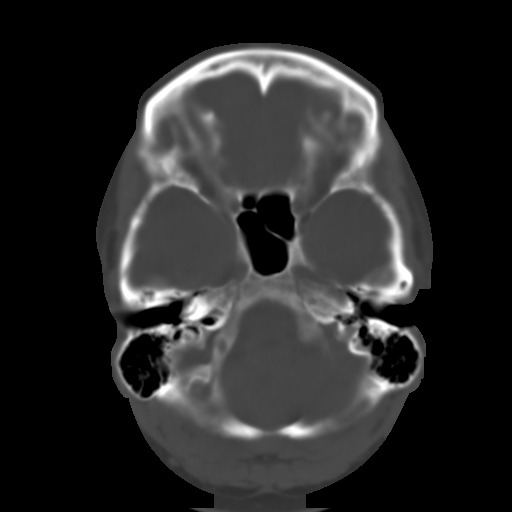
[im 9/30  brain]
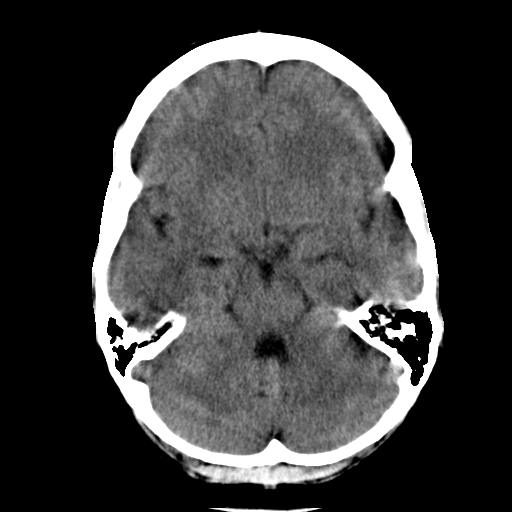
[im 13/30  brain]
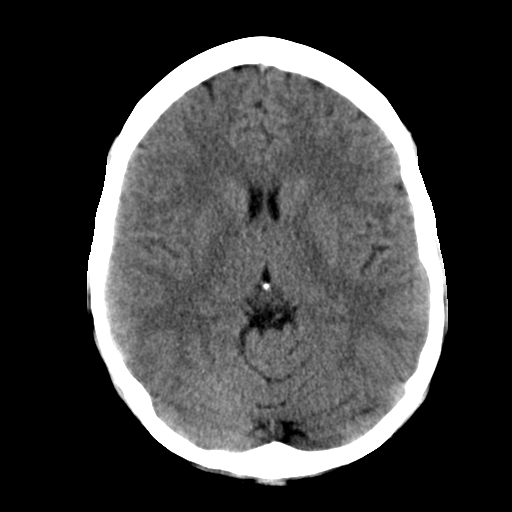
[im 17/30  brain]
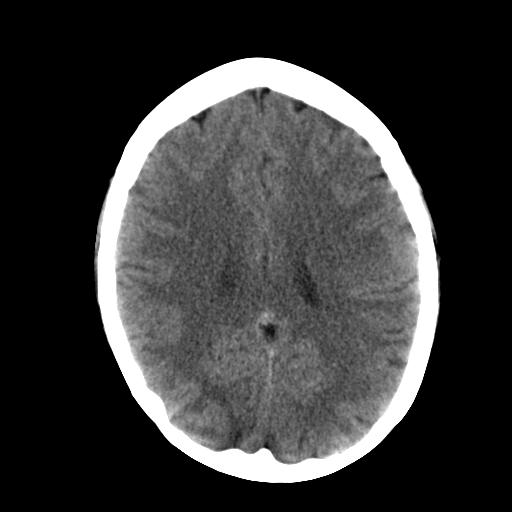
[im 21/30  brain]
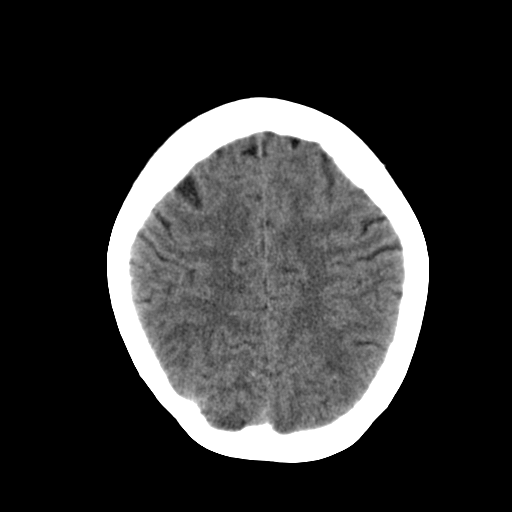
[im 21/30  bone]
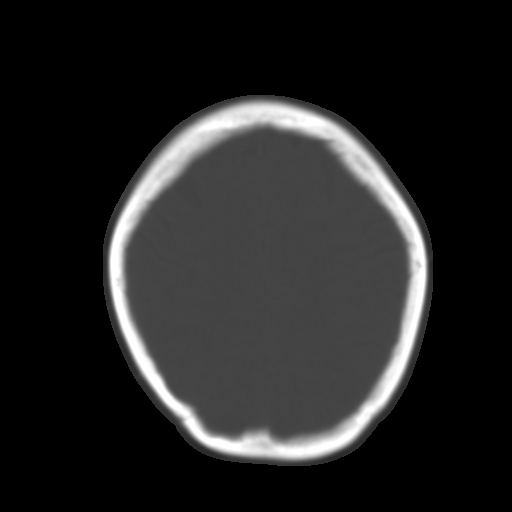
[im 25/30  brain]
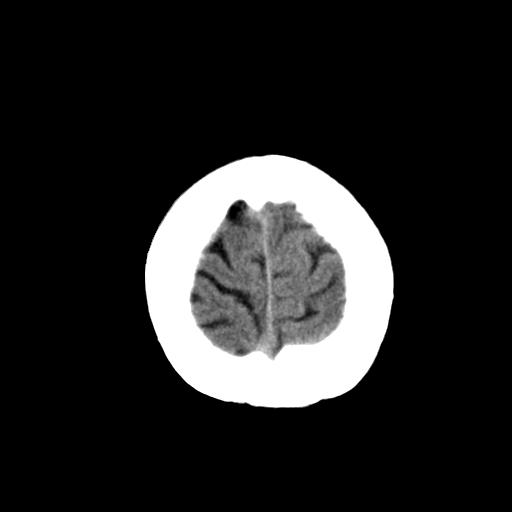

[Series 3: head bone · axial · 0.40mm/px · z∈[-68,+66]mm · 8 of 83 slices shown]
[im 8/83  bone]
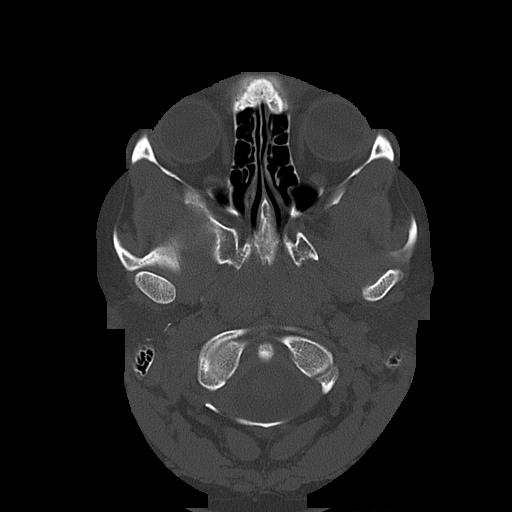
[im 16/83  bone]
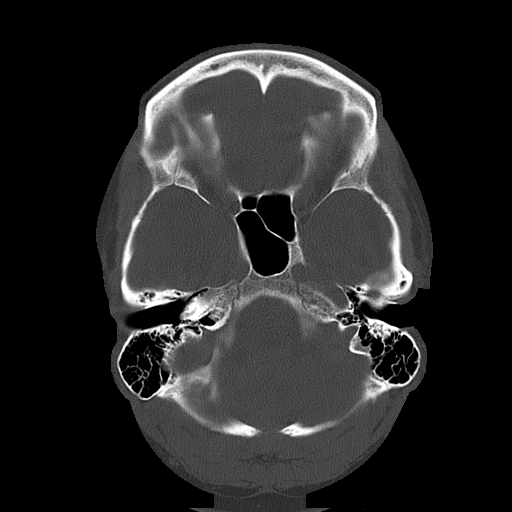
[im 28/83  bone]
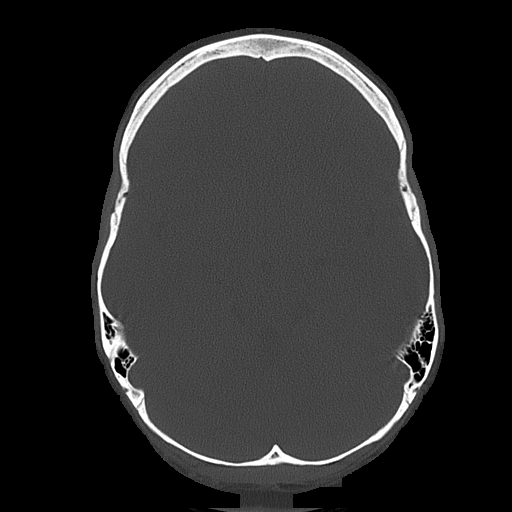
[im 36/83  bone]
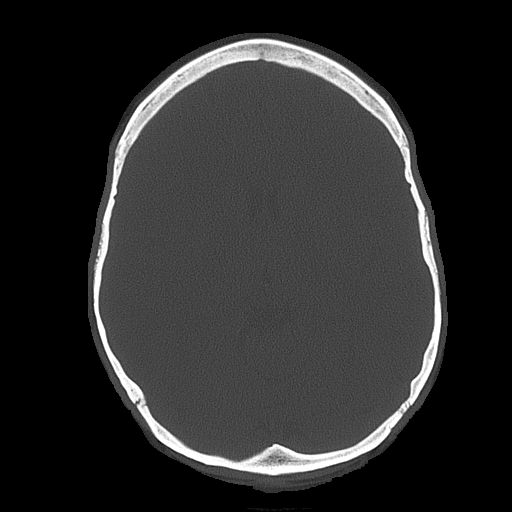
[im 47/83  bone]
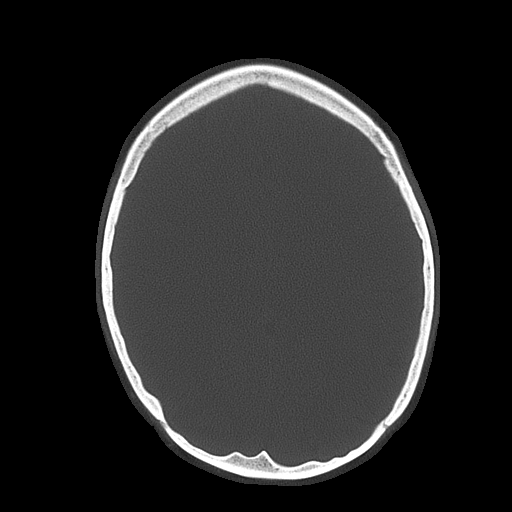
[im 55/83  bone]
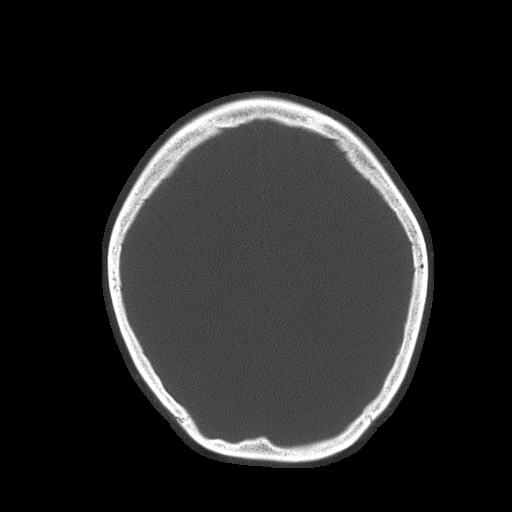
[im 67/83  bone]
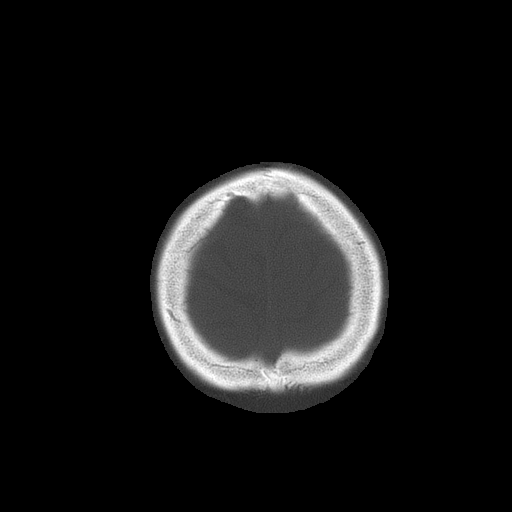
[im 75/83  bone]
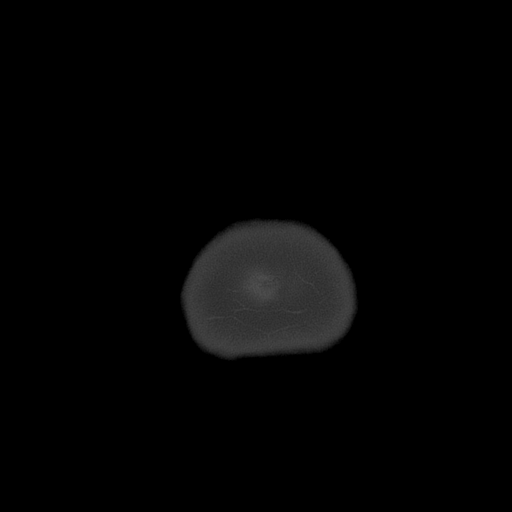

[14 of 30 positions shown; findings below may reference images not displayed]

FINDINGS: Examination is somewhat limited by patient motion.

The ventricles are normal in size and configuration. No extra-axial
fluid collections are identified. The gray-white differentiation is
normal. No CT findings for acute intracranial process such as
hemorrhage or infarction. No mass lesions. The brainstem and
cerebellum are grossly normal.

The bony structures are intact. The paranasal sinuses and mastoid
air cells are clear. The globes are intact.
IMPRESSION: Normal head CT.

## 2017-07-28 DIAGNOSIS — F410 Panic disorder [episodic paroxysmal anxiety] without agoraphobia: Secondary | ICD-10-CM | POA: Diagnosis not present

## 2017-07-28 DIAGNOSIS — F411 Generalized anxiety disorder: Secondary | ICD-10-CM | POA: Diagnosis not present

## 2017-07-28 DIAGNOSIS — F330 Major depressive disorder, recurrent, mild: Secondary | ICD-10-CM | POA: Diagnosis not present

## 2017-07-29 DIAGNOSIS — F330 Major depressive disorder, recurrent, mild: Secondary | ICD-10-CM | POA: Diagnosis not present

## 2017-07-30 ENCOUNTER — Encounter: Payer: Self-pay | Admitting: Obstetrics and Gynecology

## 2017-07-30 ENCOUNTER — Ambulatory Visit (INDEPENDENT_AMBULATORY_CARE_PROVIDER_SITE_OTHER): Payer: BLUE CROSS/BLUE SHIELD | Admitting: Obstetrics and Gynecology

## 2017-07-30 VITALS — BP 98/66 | HR 114 | Wt 217.1 lb

## 2017-07-30 DIAGNOSIS — Z131 Encounter for screening for diabetes mellitus: Secondary | ICD-10-CM

## 2017-07-30 DIAGNOSIS — F329 Major depressive disorder, single episode, unspecified: Secondary | ICD-10-CM

## 2017-07-30 DIAGNOSIS — O34219 Maternal care for unspecified type scar from previous cesarean delivery: Secondary | ICD-10-CM | POA: Diagnosis not present

## 2017-07-30 DIAGNOSIS — F32A Depression, unspecified: Secondary | ICD-10-CM

## 2017-07-30 DIAGNOSIS — O4440 Low lying placenta NOS or without hemorrhage, unspecified trimester: Secondary | ICD-10-CM | POA: Diagnosis not present

## 2017-07-30 DIAGNOSIS — O99210 Obesity complicating pregnancy, unspecified trimester: Secondary | ICD-10-CM

## 2017-07-30 DIAGNOSIS — F419 Anxiety disorder, unspecified: Secondary | ICD-10-CM | POA: Diagnosis not present

## 2017-07-30 DIAGNOSIS — Z130 Encounter for screening for diseases of the blood and blood-forming organs and certain disorders involving the immune mechanism: Secondary | ICD-10-CM | POA: Diagnosis not present

## 2017-07-30 DIAGNOSIS — Z3492 Encounter for supervision of normal pregnancy, unspecified, second trimester: Secondary | ICD-10-CM | POA: Diagnosis not present

## 2017-07-30 LAB — POCT URINALYSIS DIPSTICK
Bilirubin, UA: NEGATIVE
Glucose, UA: NEGATIVE
KETONES UA: NEGATIVE
Leukocytes, UA: NEGATIVE
Nitrite, UA: NEGATIVE
PROTEIN UA: NEGATIVE
RBC UA: NEGATIVE
Spec Grav, UA: 1.015 (ref 1.010–1.025)
UROBILINOGEN UA: 0.2 U/dL
pH, UA: 6.5 (ref 5.0–8.0)

## 2017-07-30 NOTE — Progress Notes (Signed)
ROB: Patient doing well, no complaints.  Desires to discuss further the recent recommendations for her regarding repeat C-section vs TOLAC.  Patient notes she strongly desires a TOLAC, but was recommended to have a repeat C-section for narrow pubic arch, pelvic arrest, and VBAC score of 46%. Last fetus was 8 lb 7 oz.  Discussed with patient that I agreed with current recommendations, and that the likelihood of her chances for success for TOLAC would possibly improve only if current fetus was significantly smaller (by at least 1 lb), as she may have a lower chance of CPD. Offered patient f/u growth ultrasound around 37 weeks, and based on fetal size further recommendations could be made.  Patient in agreement with plan, notes that she will gladly comply with C-section if fetal size is significant.   Patient also desires to discuss current depression medications. Notes that her psychiatrist recommends increasing to 150 mg the week prior to delivery (currently on 100 mg) due to patient's moderate to severe postpartum depression after last pregnancy. Discussed risks vs benefits of psychiatric meds at time of delivery (especially risk of TTN) vs maternal risk.  Will need to discuss with other OB's in practice and possibly even psychiatrist to determine best approach (alternative is increasing dose immediately after delivery).  WIll follow up.  RTC in 2-3 weeks, for 28 week labs and Rhogam at that time. Also needs repeat sono for low lying placenta.   A total of 15 minutes were spent face-to-face with the patient during this encounter and over half of that time dealt with counseling and coordination of care.

## 2017-07-30 NOTE — Progress Notes (Signed)
Pt is here for a routine OB visit. 

## 2017-08-06 DIAGNOSIS — Z3483 Encounter for supervision of other normal pregnancy, third trimester: Secondary | ICD-10-CM | POA: Diagnosis not present

## 2017-08-06 DIAGNOSIS — Z3482 Encounter for supervision of other normal pregnancy, second trimester: Secondary | ICD-10-CM | POA: Diagnosis not present

## 2017-08-19 DIAGNOSIS — F330 Major depressive disorder, recurrent, mild: Secondary | ICD-10-CM | POA: Diagnosis not present

## 2017-08-20 ENCOUNTER — Ambulatory Visit: Payer: BLUE CROSS/BLUE SHIELD

## 2017-08-20 ENCOUNTER — Ambulatory Visit (INDEPENDENT_AMBULATORY_CARE_PROVIDER_SITE_OTHER): Payer: BLUE CROSS/BLUE SHIELD

## 2017-08-20 ENCOUNTER — Encounter: Payer: Self-pay | Admitting: Obstetrics and Gynecology

## 2017-08-20 ENCOUNTER — Ambulatory Visit (INDEPENDENT_AMBULATORY_CARE_PROVIDER_SITE_OTHER): Payer: BLUE CROSS/BLUE SHIELD | Admitting: Obstetrics and Gynecology

## 2017-08-20 VITALS — BP 96/62 | HR 106 | Wt 222.6 lb

## 2017-08-20 DIAGNOSIS — O4440 Low lying placenta NOS or without hemorrhage, unspecified trimester: Secondary | ICD-10-CM

## 2017-08-20 DIAGNOSIS — Z6791 Unspecified blood type, Rh negative: Secondary | ICD-10-CM

## 2017-08-20 DIAGNOSIS — Z131 Encounter for screening for diabetes mellitus: Secondary | ICD-10-CM | POA: Diagnosis not present

## 2017-08-20 DIAGNOSIS — Z23 Encounter for immunization: Secondary | ICD-10-CM | POA: Diagnosis not present

## 2017-08-20 DIAGNOSIS — Z130 Encounter for screening for diseases of the blood and blood-forming organs and certain disorders involving the immune mechanism: Secondary | ICD-10-CM

## 2017-08-20 DIAGNOSIS — Z3493 Encounter for supervision of normal pregnancy, unspecified, third trimester: Secondary | ICD-10-CM

## 2017-08-20 DIAGNOSIS — O26893 Other specified pregnancy related conditions, third trimester: Secondary | ICD-10-CM

## 2017-08-20 DIAGNOSIS — O09893 Supervision of other high risk pregnancies, third trimester: Secondary | ICD-10-CM

## 2017-08-20 LAB — POCT URINALYSIS DIPSTICK
BILIRUBIN UA: NEGATIVE
Blood, UA: NEGATIVE
GLUCOSE UA: NEGATIVE
Ketones, UA: NEGATIVE
Leukocytes, UA: NEGATIVE
Nitrite, UA: NEGATIVE
Protein, UA: NEGATIVE
SPEC GRAV UA: 1.01 (ref 1.010–1.025)
Urobilinogen, UA: 0.2 E.U./dL
pH, UA: 6 (ref 5.0–8.0)

## 2017-08-20 MED ORDER — RHO D IMMUNE GLOBULIN 1500 UNITS IM SOSY
1500.0000 [IU] | PREFILLED_SYRINGE | Freq: Once | INTRAMUSCULAR | Status: AC
  Administered 2017-08-20: 1500 [IU] via INTRAMUSCULAR

## 2017-08-20 NOTE — Progress Notes (Signed)
ROB:  Patient thinks she has decided on C-section with tubal ligation. She says she will sign tubal papers next time and inform us of the permanence of her decision. Plan to schedule C-section at next visit if she desires.  Low lying placenta resolved.

## 2017-08-21 LAB — CBC
Hematocrit: 33.4 % — ABNORMAL LOW (ref 34.0–46.6)
Hemoglobin: 11.3 g/dL (ref 11.1–15.9)
MCH: 31.5 pg (ref 26.6–33.0)
MCHC: 33.8 g/dL (ref 31.5–35.7)
MCV: 93 fL (ref 79–97)
PLATELETS: 252 10*3/uL (ref 150–379)
RBC: 3.59 x10E6/uL — ABNORMAL LOW (ref 3.77–5.28)
RDW: 12.9 % (ref 12.3–15.4)
WBC: 8.3 10*3/uL (ref 3.4–10.8)

## 2017-08-21 LAB — GLUCOSE, 1 HOUR GESTATIONAL: Gestational Diabetes Screen: 107 mg/dL (ref 65–139)

## 2017-08-22 IMAGING — CT CT RENAL STONE PROTOCOL
1 of 2 series · 15 of 32 positions shown, 19 images · non-contrast
Comparison: 08/02/2013

CLINICAL DATA: Left flank pain for 2 weeks. Nausea. Urinary tract
infection

EXAM:
CT ABDOMEN AND PELVIS WITHOUT CONTRAST
TECHNIQUE: Multidetector CT imaging of the abdomen and pelvis was performed
following the standard protocol without IV contrast.

[Series 2: axial st · axial · 0.68mm/px · z∈[-1080,-640]mm · 15 of 97 slices shown, 19 images]
[im 5/97  soft-tissue]
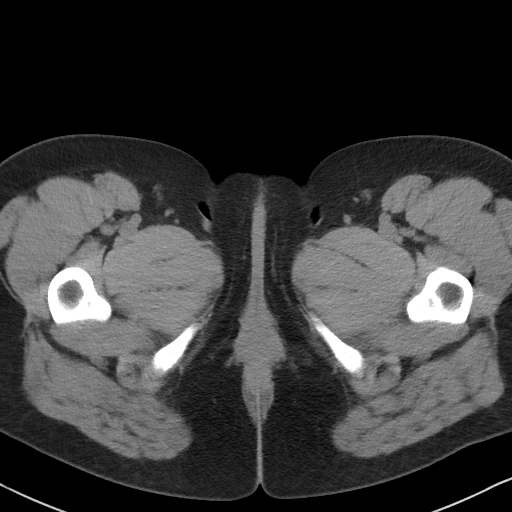
[im 5/97  bone]
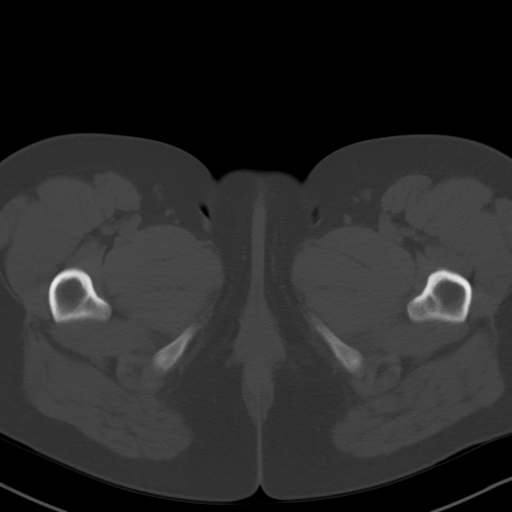
[im 13/97  soft-tissue]
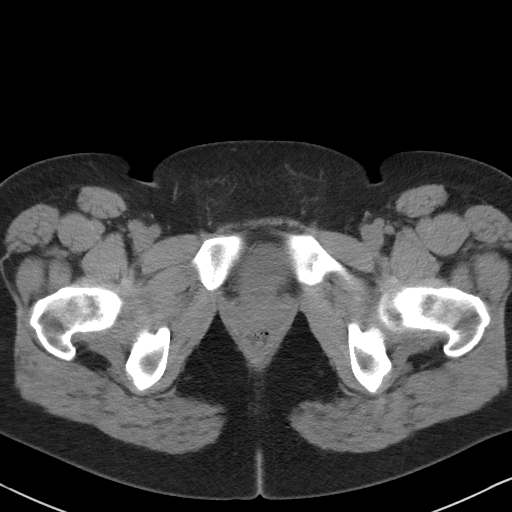
[im 21/97  soft-tissue]
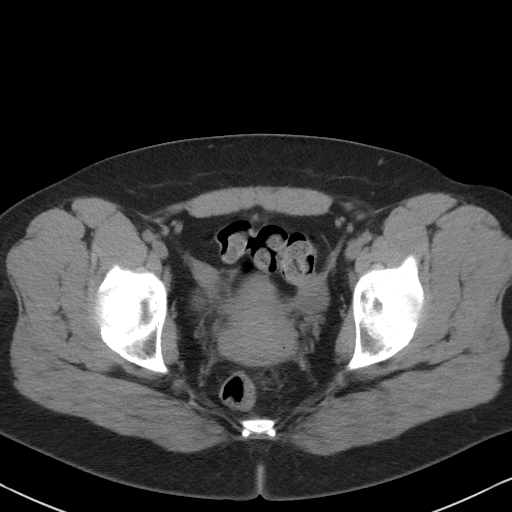
[im 29/97  soft-tissue]
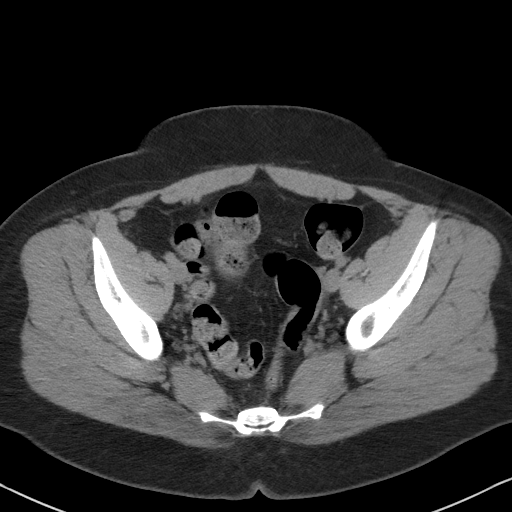
[im 33/97  soft-tissue]
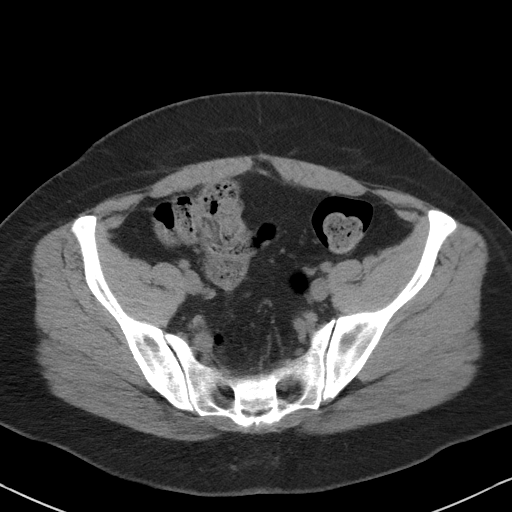
[im 41/97  soft-tissue]
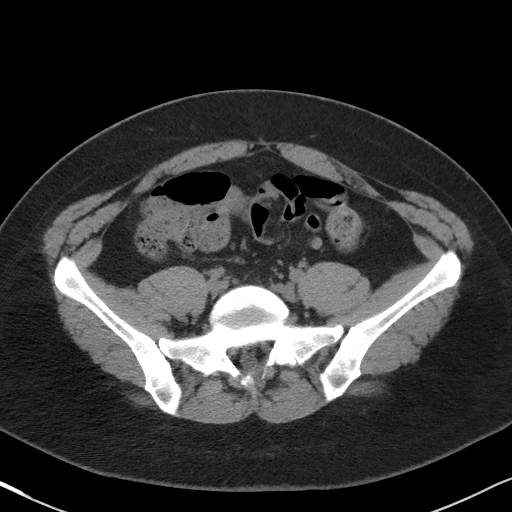
[im 49/97  soft-tissue]
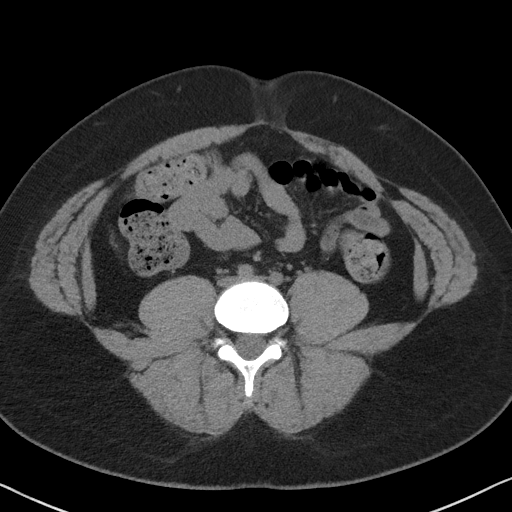
[im 57/97  soft-tissue]
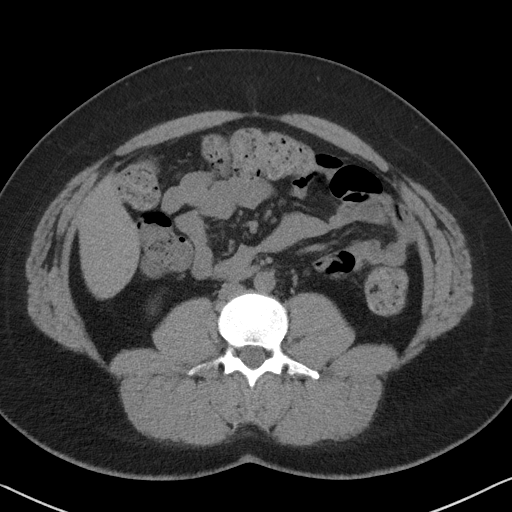
[im 65/97  soft-tissue]
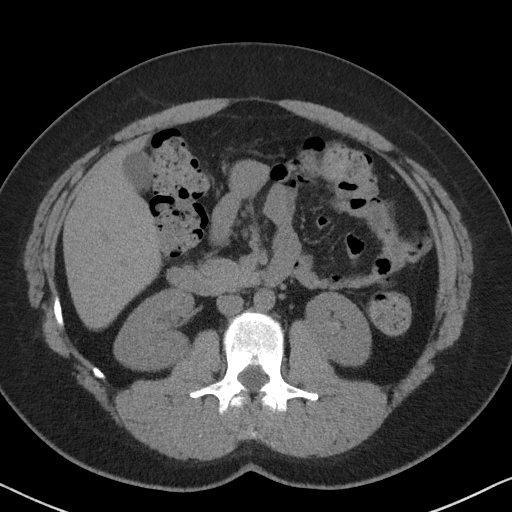
[im 65/97  bone]
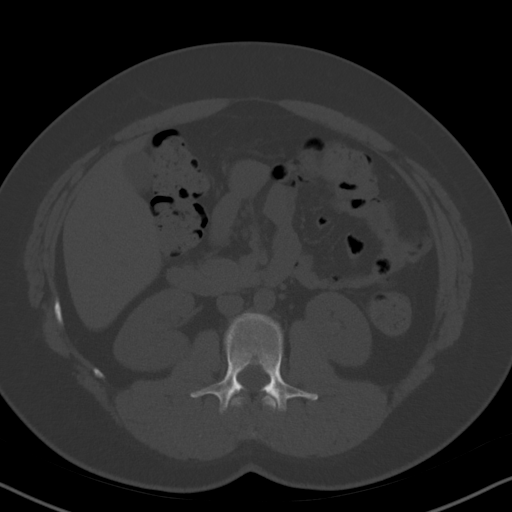
[im 69/97  soft-tissue]
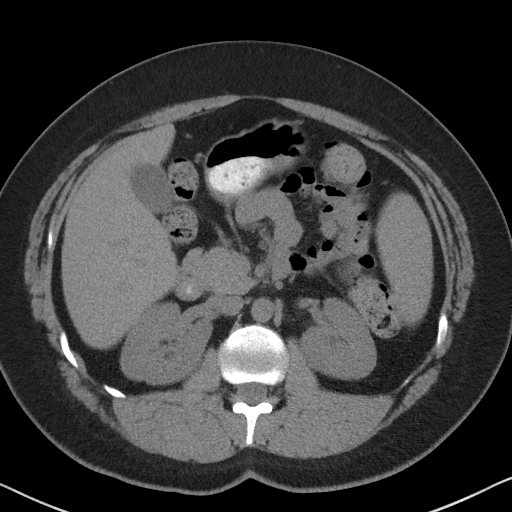
[im 77/97  soft-tissue]
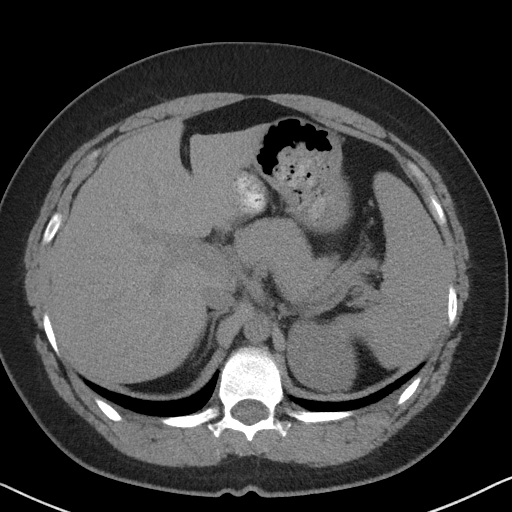
[im 81/97  lung]
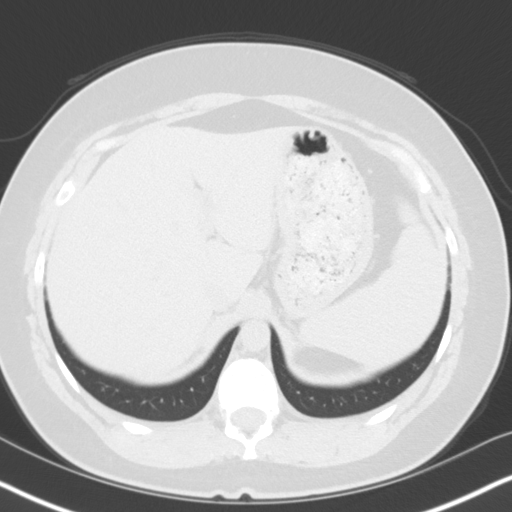
[im 85/97  soft-tissue]
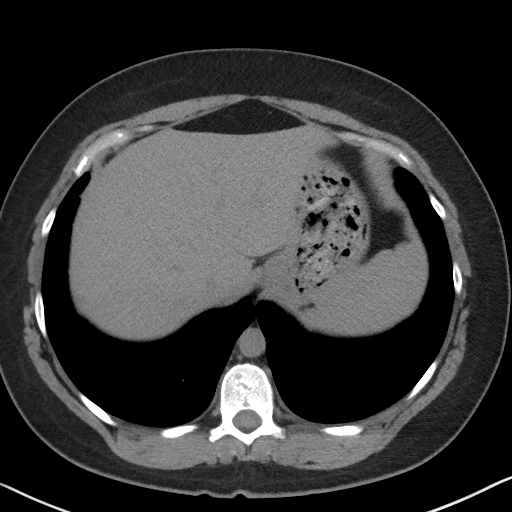
[im 85/97  lung]
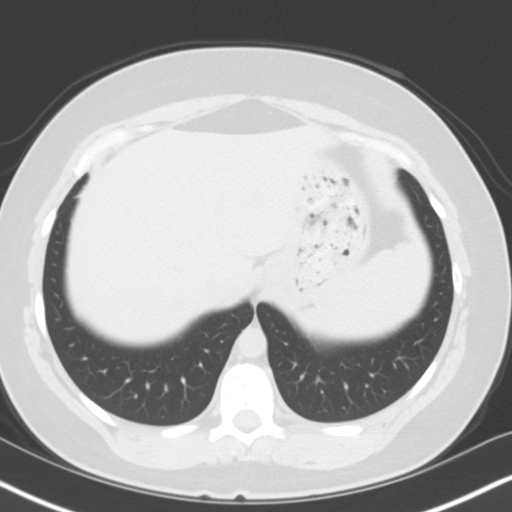
[im 89/97  lung]
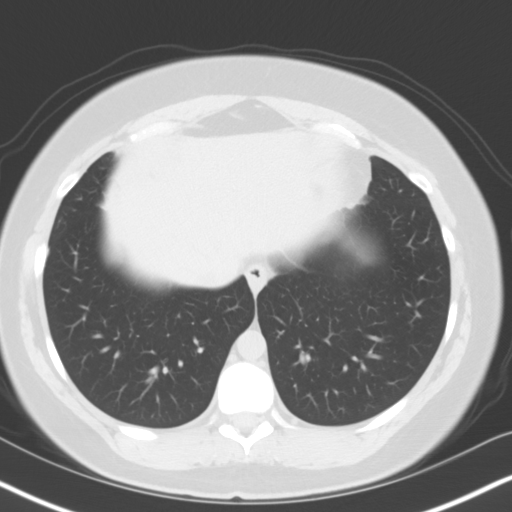
[im 93/97  soft-tissue]
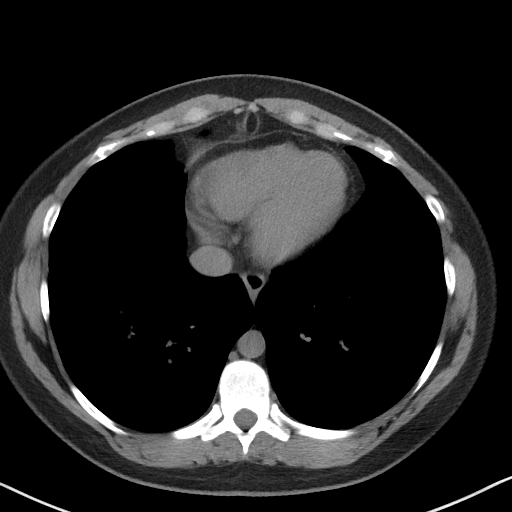
[im 93/97  lung]
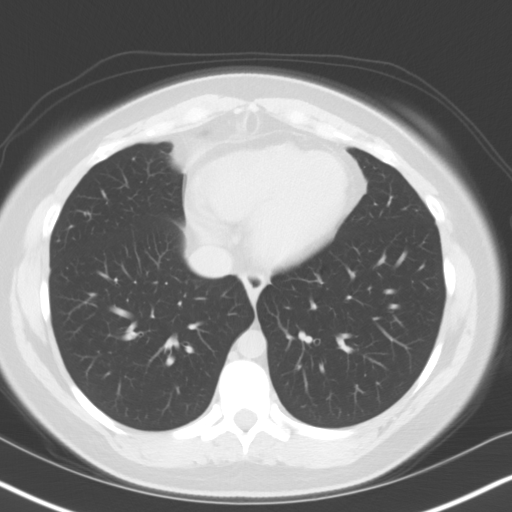

[15 of 32 positions shown; findings below may reference images not displayed]

FINDINGS: Lower chest:  No acute findings.

Hepatobiliary: No mass visualized on this un-enhanced exam.
Gallbladder is unremarkable

Pancreas: No mass or inflammatory process identified on this
un-enhanced exam.

Spleen: Within normal limits in size.

Adrenals/Urinary Tract: No evidence of urolithiasis or
hydronephrosis. No definite mass visualized on this un-enhanced
exam.

Stomach/Bowel: No evidence of obstruction, inflammatory process, or
abnormal fluid collections. Appendix is normal.

Vascular/Lymphatic: No pathologically enlarged lymph nodes. No
evidence of abdominal aortic aneurysm.

Reproductive: No mass or other significant abnormality.

Other: None.

Musculoskeletal:  No suspicious bone lesions identified.
IMPRESSION: No evidence of urolithiasis, hydronephrosis, or other acute
findings.

## 2017-08-31 ENCOUNTER — Encounter: Payer: Self-pay | Admitting: Obstetrics and Gynecology

## 2017-08-31 ENCOUNTER — Ambulatory Visit (INDEPENDENT_AMBULATORY_CARE_PROVIDER_SITE_OTHER): Payer: BLUE CROSS/BLUE SHIELD | Admitting: Obstetrics and Gynecology

## 2017-08-31 VITALS — BP 109/66 | HR 96 | Wt 219.1 lb

## 2017-08-31 DIAGNOSIS — O219 Vomiting of pregnancy, unspecified: Secondary | ICD-10-CM | POA: Diagnosis not present

## 2017-08-31 DIAGNOSIS — R102 Pelvic and perineal pain: Secondary | ICD-10-CM

## 2017-08-31 DIAGNOSIS — R1011 Right upper quadrant pain: Secondary | ICD-10-CM

## 2017-08-31 DIAGNOSIS — O26893 Other specified pregnancy related conditions, third trimester: Secondary | ICD-10-CM | POA: Diagnosis not present

## 2017-08-31 DIAGNOSIS — O4693 Antepartum hemorrhage, unspecified, third trimester: Secondary | ICD-10-CM

## 2017-08-31 LAB — POCT URINALYSIS DIPSTICK
BILIRUBIN UA: NEGATIVE
Glucose, UA: NEGATIVE
Ketones, UA: NEGATIVE
NITRITE UA: NEGATIVE
PH UA: 7.5 (ref 5.0–8.0)
Spec Grav, UA: 1.01 (ref 1.010–1.025)
Urobilinogen, UA: 0.2 E.U./dL

## 2017-08-31 NOTE — Progress Notes (Signed)
PROBLEM OB VISIT: 30.3 week IUP  Chief complaint: 1. Vaginal bleeding 2. Right-sided abdominal pain 3. Nausea and vomiting increasing over the past 2 weeks  OB w/i- vaginal bleeding. Pt states she was vomiting this am. While vomtting she was urinating on herself. When she wiped there was blood on the paper towel. PP and h/a have been an issue this weekend. Rxed firocet but not helping much. Patient reports good fetal movement. She denies uterine contractions or pelvic cramping. She has not had any other episodes of bleeding this pregnancy. Patient is Rh- and did receive her again at 28 weeks.  Patient states that her nausea with vomiting has worsened over the past several weeks to where she has to take Zofran twice a day. She is keeping food and fluids down but she is not drinking as much fluids. She denies fevers chills or sweats. She denies UTI symptoms. She denies change in bowel function.  OBJECTIVE: BP 109/66   Pulse 96   Wt 219 lb 1.6 oz (99.4 kg)   LMP 11/02/2016 (Exact Date)   BMI 34.32 kg/m  Pleasant well-appearing gravid female in no acute distress. Alert and oriented. Back: No CVA tenderness Abdomen: Soft, nontender; uterus nontender; fundal height is 32 cm Pelvic exam: Sterile speculum-moderate mucousy secretions; no blood; cervix appears closed Bimanual-cervix is long, closed, -3; uterus nontender  NONSTRESS TEST INTERPRETATION  INDICATIONS:vaginal bleeding/spotting third trimester  FHR baseline: 130s RESULTS:Reactive COMMENTS:   ASSESSMENT: 1. 30.3 week intrauterine pregnancy with episode of vaginal bleeding, unclear etiology 2. Reactive NST with fetal well-being confirmed 3. No evidence of preterm labor 4. Recent exacerbation of nausea and vomiting, unclear etiology 5. Rh-, status post RhoGAM at 28 weeks  PLAN: 1. Continue fetal kick counts twice a day. 2. Follow-up in 72 hours as scheduled for regular OB appointment 3. Maintain by mouth fluid hydration 4.  Continue Zofran twice a day as needed for nausea controlled 5. CBC and hepatic profile is to be obtained rule out liver disease as a cause to her abdominal pain and nausea and vomiting  Darol Destine, CMA  Herold Harms, MD  A total of 15 minutes were spent face-to-face with the patient during this encounter and over half of that time dealt with counseling and coordination of care.  Note: This dictation was prepared with Dragon dictation along with smaller phrase technology. Any transcriptional errors that result from this process are unintentional.

## 2017-08-31 NOTE — Patient Instructions (Signed)
1. Continue taking Zofran twice a day as needed for nausea control 2. CBC and hepatic profile are obtained today to assess for reason for nausea and vomiting and abdominal pain 3. Keep appointment with Dr. Valentino Saxon on Thursday as scheduled for regular OB appointment 4. Continue with maintaining hydration. 5. Continue with fetal kick counts twice a day for reassurance of fetal well-being 6. NST today was reassuring for fetal well-being.

## 2017-09-01 ENCOUNTER — Telehealth: Payer: Self-pay | Admitting: Obstetrics and Gynecology

## 2017-09-01 LAB — CBC WITH DIFFERENTIAL/PLATELET
BASOS: 0 %
Basophils Absolute: 0 10*3/uL (ref 0.0–0.2)
EOS (ABSOLUTE): 0.1 10*3/uL (ref 0.0–0.4)
EOS: 1 %
HEMATOCRIT: 33 % — AB (ref 34.0–46.6)
HEMOGLOBIN: 11.5 g/dL (ref 11.1–15.9)
IMMATURE GRANS (ABS): 0 10*3/uL (ref 0.0–0.1)
IMMATURE GRANULOCYTES: 1 %
LYMPHS: 20 %
Lymphocytes Absolute: 1.6 10*3/uL (ref 0.7–3.1)
MCH: 31.7 pg (ref 26.6–33.0)
MCHC: 34.8 g/dL (ref 31.5–35.7)
MCV: 91 fL (ref 79–97)
Monocytes Absolute: 0.3 10*3/uL (ref 0.1–0.9)
Monocytes: 4 %
NEUTROS PCT: 74 %
Neutrophils Absolute: 6 10*3/uL (ref 1.4–7.0)
Platelets: 243 10*3/uL (ref 150–379)
RBC: 3.63 x10E6/uL — AB (ref 3.77–5.28)
RDW: 12.9 % (ref 12.3–15.4)
WBC: 8.1 10*3/uL (ref 3.4–10.8)

## 2017-09-01 LAB — HEPATIC FUNCTION PANEL
ALBUMIN: 3.5 g/dL (ref 3.5–5.5)
ALT: 29 IU/L (ref 0–32)
AST: 24 IU/L (ref 0–40)
Alkaline Phosphatase: 82 IU/L (ref 39–117)
Bilirubin, Direct: 0.08 mg/dL (ref 0.00–0.40)
TOTAL PROTEIN: 6 g/dL (ref 6.0–8.5)

## 2017-09-01 NOTE — Telephone Encounter (Signed)
Pt stated that she is still bleeding and her urine has a strong odor. Pt stated that her side is also still hurting. Pt stated that she understands her results probably but she wasn't sure if she should try taking anything or what she can do. [facility] [address]. Please advise. Thanks TNP

## 2017-09-02 LAB — URINE CULTURE

## 2017-09-03 ENCOUNTER — Ambulatory Visit (INDEPENDENT_AMBULATORY_CARE_PROVIDER_SITE_OTHER): Payer: BLUE CROSS/BLUE SHIELD | Admitting: Obstetrics and Gynecology

## 2017-09-03 VITALS — BP 92/60 | HR 102 | Wt 219.1 lb

## 2017-09-03 DIAGNOSIS — O219 Vomiting of pregnancy, unspecified: Secondary | ICD-10-CM

## 2017-09-03 DIAGNOSIS — Z98891 History of uterine scar from previous surgery: Secondary | ICD-10-CM

## 2017-09-03 DIAGNOSIS — Z3483 Encounter for supervision of other normal pregnancy, third trimester: Secondary | ICD-10-CM

## 2017-09-03 LAB — POCT URINALYSIS DIPSTICK
Bilirubin, UA: NEGATIVE
Glucose, UA: NEGATIVE
Ketones, UA: NEGATIVE
NITRITE UA: NEGATIVE
PH UA: 6 (ref 5.0–8.0)
RBC UA: NEGATIVE
SPEC GRAV UA: 1.025 (ref 1.010–1.025)
Urobilinogen, UA: 0.2 E.U./dL

## 2017-09-03 MED ORDER — PROMETHAZINE HCL 25 MG PO TABS: 25.0000 mg | ORAL_TABLET | Freq: Four times a day (QID) | ORAL | 2 refills | Status: DC | PRN

## 2017-09-04 NOTE — Progress Notes (Signed)
ROB: Patient still noting nausea/vomiting, despite use of Zofran.  Notes that she does not feel sick like a virus. Denies abdominal pain. Also notes that her food is sometimes coming out undigested in her stools.  In addition, was noting light bleeding when wiping. Was evaluated several days ago for these complaints with negative workup.  Denies any further bleeding today. Will prescribe Phenergan (patient notes that this has worked better for her in the past).  Symptoms may likely be due to fetal positioning affecting GI tract. Patient to f/u in 2 weeks, or sooner if symptoms progress.

## 2017-09-18 ENCOUNTER — Encounter: Payer: Self-pay | Admitting: Obstetrics and Gynecology

## 2017-09-18 ENCOUNTER — Ambulatory Visit (INDEPENDENT_AMBULATORY_CARE_PROVIDER_SITE_OTHER): Payer: BLUE CROSS/BLUE SHIELD | Admitting: Obstetrics and Gynecology

## 2017-09-18 VITALS — BP 107/61 | HR 90 | Wt 223.0 lb

## 2017-09-18 DIAGNOSIS — Z98891 History of uterine scar from previous surgery: Secondary | ICD-10-CM

## 2017-09-18 DIAGNOSIS — F329 Major depressive disorder, single episode, unspecified: Secondary | ICD-10-CM

## 2017-09-18 DIAGNOSIS — F419 Anxiety disorder, unspecified: Secondary | ICD-10-CM

## 2017-09-18 DIAGNOSIS — O99210 Obesity complicating pregnancy, unspecified trimester: Secondary | ICD-10-CM

## 2017-09-18 DIAGNOSIS — F32A Depression, unspecified: Secondary | ICD-10-CM

## 2017-09-18 DIAGNOSIS — Z23 Encounter for immunization: Secondary | ICD-10-CM

## 2017-09-18 DIAGNOSIS — Z3483 Encounter for supervision of other normal pregnancy, third trimester: Secondary | ICD-10-CM

## 2017-09-18 LAB — POCT URINALYSIS DIPSTICK
BILIRUBIN UA: NEGATIVE
GLUCOSE UA: NEGATIVE
Nitrite, UA: NEGATIVE
PH UA: 6.5 (ref 5.0–8.0)
RBC UA: NEGATIVE
SPEC GRAV UA: 1.025 (ref 1.010–1.025)
Urobilinogen, UA: 0.2 E.U./dL

## 2017-09-18 NOTE — Progress Notes (Signed)
ROB- no complaints. Fu vaccine given.

## 2017-09-18 NOTE — Patient Instructions (Signed)

## 2017-09-18 NOTE — Progress Notes (Signed)
ROB: Patient doing well, no complaints. Does note occasional episodes of feeling palpitations and feeling flushed and near syncopal when the baby moves a certain way.  Discussed that baby may affecting certain nerves, however this can be normal. Patient now notes that she desires BTL with her repeat C-section.  Disscused risks, benefits.  Flu vaccine given today. RTC in 2 weeks.

## 2017-09-21 ENCOUNTER — Telehealth: Payer: Self-pay | Admitting: Obstetrics and Gynecology

## 2017-09-21 ENCOUNTER — Encounter: Payer: Self-pay | Admitting: Obstetrics and Gynecology

## 2017-09-21 NOTE — Telephone Encounter (Signed)
Started vomiting Saturday about 3am - she has vomited at least 16 times and can't keep anything down - urinated at about 6am but not since - baby is active   Does she need to be seen??

## 2017-09-21 NOTE — Telephone Encounter (Signed)
Spoke with pt she states she has been able to keep some Sprite down. Has tried to take her Zofan/ phenergan given by Dr Valentino Saxon but vomited that. Active fetal movement. Denies diarrhea. Encouraged to continue to take smalls frequent sips. Advised to call back if she develops a fever, dizziness or diarrhea. Continue to monitor fetal movement. Pt is in agreement.

## 2017-09-25 ENCOUNTER — Encounter: Payer: Self-pay | Admitting: Obstetrics and Gynecology

## 2017-09-25 ENCOUNTER — Other Ambulatory Visit: Payer: Self-pay | Admitting: Obstetrics and Gynecology

## 2017-10-01 ENCOUNTER — Encounter: Payer: BLUE CROSS/BLUE SHIELD | Admitting: Obstetrics and Gynecology

## 2017-10-05 ENCOUNTER — Encounter: Payer: Self-pay | Admitting: Obstetrics and Gynecology

## 2017-10-05 ENCOUNTER — Ambulatory Visit (INDEPENDENT_AMBULATORY_CARE_PROVIDER_SITE_OTHER): Payer: BLUE CROSS/BLUE SHIELD | Admitting: Obstetrics and Gynecology

## 2017-10-05 VITALS — BP 108/69 | HR 99 | Wt 225.2 lb

## 2017-10-05 DIAGNOSIS — Z3483 Encounter for supervision of other normal pregnancy, third trimester: Secondary | ICD-10-CM | POA: Diagnosis not present

## 2017-10-05 DIAGNOSIS — Z98891 History of uterine scar from previous surgery: Secondary | ICD-10-CM

## 2017-10-05 LAB — POCT URINALYSIS DIPSTICK
Bilirubin, UA: NEGATIVE
Blood, UA: NEGATIVE
Glucose, UA: NEGATIVE
KETONES UA: NEGATIVE
LEUKOCYTES UA: NEGATIVE
NITRITE UA: NEGATIVE
PH UA: 6.5 (ref 5.0–8.0)
Spec Grav, UA: 1.025 (ref 1.010–1.025)
Urobilinogen, UA: 0.2 E.U./dL

## 2017-10-05 NOTE — Progress Notes (Signed)
ROB- Pt here today c/o dyspnea with exertion, does have some tightness and cramping

## 2017-10-05 NOTE — Progress Notes (Signed)
ROB: Feels occasionally constricted from taking a deep breath because of the uterus.  She feels like it affects her anxiety sometimes.  We talked about this in some detail.  Occasional "tightenings"possibly [employer] CorporationBraxton Hicks. GBS GC/CT done today.

## 2017-10-06 LAB — GC/CHLAMYDIA PROBE AMP
Chlamydia trachomatis, NAA: NEGATIVE
Neisseria gonorrhoeae by PCR: NEGATIVE

## 2017-10-07 LAB — STREP GP B NAA: STREP GROUP B AG: NEGATIVE

## 2017-10-12 ENCOUNTER — Encounter: Payer: Self-pay | Admitting: Obstetrics and Gynecology

## 2017-10-12 ENCOUNTER — Ambulatory Visit (INDEPENDENT_AMBULATORY_CARE_PROVIDER_SITE_OTHER): Payer: BLUE CROSS/BLUE SHIELD | Admitting: Obstetrics and Gynecology

## 2017-10-12 VITALS — BP 103/67 | HR 118 | Wt 227.4 lb

## 2017-10-12 DIAGNOSIS — Z98891 History of uterine scar from previous surgery: Secondary | ICD-10-CM

## 2017-10-12 DIAGNOSIS — Z3483 Encounter for supervision of other normal pregnancy, third trimester: Secondary | ICD-10-CM

## 2017-10-12 DIAGNOSIS — F419 Anxiety disorder, unspecified: Secondary | ICD-10-CM

## 2017-10-12 DIAGNOSIS — O99343 Other mental disorders complicating pregnancy, third trimester: Secondary | ICD-10-CM

## 2017-10-12 LAB — POCT URINALYSIS DIPSTICK
Bilirubin, UA: NEGATIVE
Glucose, UA: NEGATIVE
Ketones, UA: NEGATIVE
Leukocytes, UA: NEGATIVE
Nitrite, UA: NEGATIVE
PH UA: 6.5 (ref 5.0–8.0)
RBC UA: NEGATIVE
UROBILINOGEN UA: 0.2 U/dL

## 2017-10-12 NOTE — Progress Notes (Signed)
ROB- Pt states she is doing well, no complaints as of today  

## 2017-10-12 NOTE — Progress Notes (Signed)
ROB: Patient denies complaints. Further discussed plans for C-section, all questions answered. Is planning to increase Zoloft to 150 mg a week prior to delivery per Psychiatry recommendations.  Notes her anxiety has been increasing, but is trying to manage until then.  Will f/u with final ultrasound for growth for h/o anti-depression meds (as well as patient is now again on the fence regarding TOLAC vs repeat C-section, but notes she will still likely go with repeat C-section). GBS negative. RTC in 1 week.

## 2017-10-19 ENCOUNTER — Encounter: Payer: Self-pay | Admitting: Obstetrics and Gynecology

## 2017-10-19 ENCOUNTER — Ambulatory Visit (INDEPENDENT_AMBULATORY_CARE_PROVIDER_SITE_OTHER): Payer: BLUE CROSS/BLUE SHIELD | Admitting: Obstetrics and Gynecology

## 2017-10-19 ENCOUNTER — Ambulatory Visit (INDEPENDENT_AMBULATORY_CARE_PROVIDER_SITE_OTHER): Payer: BLUE CROSS/BLUE SHIELD

## 2017-10-19 ENCOUNTER — Telehealth: Payer: Self-pay | Admitting: Obstetrics and Gynecology

## 2017-10-19 VITALS — BP 111/66 | HR 91 | Wt 224.6 lb

## 2017-10-19 DIAGNOSIS — F419 Anxiety disorder, unspecified: Secondary | ICD-10-CM | POA: Diagnosis not present

## 2017-10-19 DIAGNOSIS — Z3483 Encounter for supervision of other normal pregnancy, third trimester: Secondary | ICD-10-CM

## 2017-10-19 DIAGNOSIS — Z98891 History of uterine scar from previous surgery: Secondary | ICD-10-CM

## 2017-10-19 DIAGNOSIS — O403XX0 Polyhydramnios, third trimester, not applicable or unspecified: Secondary | ICD-10-CM

## 2017-10-19 DIAGNOSIS — O99343 Other mental disorders complicating pregnancy, third trimester: Secondary | ICD-10-CM

## 2017-10-19 LAB — POCT URINALYSIS DIPSTICK
Bilirubin, UA: NEGATIVE
Glucose, UA: NEGATIVE
Ketones, UA: NEGATIVE
Leukocytes, UA: NEGATIVE
NITRITE UA: NEGATIVE
PH UA: 6.5 (ref 5.0–8.0)
PROTEIN UA: NEGATIVE
RBC UA: NEGATIVE
Spec Grav, UA: 1.02 (ref 1.010–1.025)
UROBILINOGEN UA: 0.2 U/dL

## 2017-10-19 NOTE — Progress Notes (Signed)
ROB: She has now confirmed her desire for repeat cesarean delivery.  She has significant anxiety and she is planning to increase her Zoloft this week 250 mg.  Ultrasound results reviewed with her in detail.  Differential diagnosis of polyhydramnios (she says she had this with her last pregnancy and it was much worse than) discussed with her.  Recommendation for nonstress test next week reviewed.  Signs and symptoms of labor and rupture of membranes discussed.  Patient would like cesarean delivery if any sign of labor or rupture of membranes.  She is generally uncomfortable.

## 2017-10-20 DIAGNOSIS — F410 Panic disorder [episodic paroxysmal anxiety] without agoraphobia: Secondary | ICD-10-CM | POA: Diagnosis not present

## 2017-10-20 DIAGNOSIS — F330 Major depressive disorder, recurrent, mild: Secondary | ICD-10-CM | POA: Diagnosis not present

## 2017-10-20 DIAGNOSIS — F411 Generalized anxiety disorder: Secondary | ICD-10-CM | POA: Diagnosis not present

## 2017-10-20 NOTE — Telephone Encounter (Signed)
error 

## 2017-10-26 ENCOUNTER — Ambulatory Visit (INDEPENDENT_AMBULATORY_CARE_PROVIDER_SITE_OTHER): Payer: BLUE CROSS/BLUE SHIELD | Admitting: Obstetrics and Gynecology

## 2017-10-26 ENCOUNTER — Other Ambulatory Visit: Payer: BLUE CROSS/BLUE SHIELD

## 2017-10-26 ENCOUNTER — Telehealth: Payer: Self-pay | Admitting: Obstetrics and Gynecology

## 2017-10-26 VITALS — BP 114/69 | HR 98 | Wt 226.6 lb

## 2017-10-26 DIAGNOSIS — O368130 Decreased fetal movements, third trimester, not applicable or unspecified: Secondary | ICD-10-CM

## 2017-10-26 DIAGNOSIS — Z3483 Encounter for supervision of other normal pregnancy, third trimester: Secondary | ICD-10-CM

## 2017-10-26 LAB — POCT URINALYSIS DIPSTICK
Bilirubin, UA: NEGATIVE
Glucose, UA: NEGATIVE
Ketones, UA: NEGATIVE
Nitrite, UA: NEGATIVE
PH UA: 7 (ref 5.0–8.0)
PROTEIN UA: NEGATIVE
RBC UA: NEGATIVE
SPEC GRAV UA: 1.025 (ref 1.010–1.025)
UROBILINOGEN UA: 0.2 U/dL

## 2017-10-26 NOTE — Progress Notes (Signed)
ROB- Pt has been having contractions, just further discussion about labor

## 2017-10-26 NOTE — Telephone Encounter (Signed)
Pt called the office due to experiencing increase pressure since visit. She states contractions are about 3 mins apart, denies fluid leakage.  Spoke with Dr. Valentino Saxonherry, and she states that this increase in pressure could be from the cervical exam today, would recommend pt to increase fluid but if she feels like it is getting worse to seek further assistant at hospital.   Spoke with pt and she states she will increase her fluid intake and try to eat something and monitor how she is doing first before deciding to go. She had no additional questions at this time.

## 2017-10-26 NOTE — Progress Notes (Signed)
ROB: Patient notes irregular ctx.  Also notes decreased fetal movement over past 2 days.  Has polyhydramnios noted on last ultrasound last week (AFI 27.0 cm).  NST performed today was reviewed and was found to be reactive.  Continue recommended antenatal testing and prenatal care. Scheduled for repeat C-section with BTL on Friday.  Discussed labor precautions. Pre-op done today.    NONSTRESS TEST INTERPRETATION  INDICATIONS: Polyhdramnios, Decreased fetal movement and Obesity  FHR baseline: 150 bpm RESULTS:Reactive COMMENTS: contractions present q 2-4 minutes   PLAN: 1. Continue fetal kick counts twice a day. 2. Scheduled for C-section 10/30/2017.

## 2017-10-28 ENCOUNTER — Telehealth: Payer: Self-pay | Admitting: Obstetrics and Gynecology

## 2017-10-28 NOTE — Telephone Encounter (Signed)
Patient called and stated that she is having contractions again, and they are a lot stronger. The patient is very concerned and has c-section scheduled on Friday. No other information was disclosed, other than wanting a call back from a nurse. Please advise.

## 2017-10-28 NOTE — Telephone Encounter (Signed)
Pt states she has been having ctx q 3 minutes for an 1- 1/2 hours. Then will go several hours with no ctx. Pos FM. NO vb or lof. Taking tylenol but not helping. Advised to increase h20. IF sx gets worse to contact office. Scheduled for c/s on 10/30/2017. Pt has had a cold but no fevers.

## 2017-10-29 ENCOUNTER — Encounter
Admission: RE | Admit: 2017-10-29 | Discharge: 2017-10-29 | Disposition: A | Discharge status: Home / Self Care | Payer: BLUE CROSS/BLUE SHIELD | Source: Ambulatory Visit | Attending: Obstetrics and Gynecology | Admitting: Obstetrics and Gynecology

## 2017-10-29 ENCOUNTER — Other Ambulatory Visit: Payer: Self-pay

## 2017-10-29 ENCOUNTER — Telehealth: Payer: Self-pay | Admitting: Obstetrics and Gynecology

## 2017-10-29 HISTORY — DX: Gastro-esophageal reflux disease without esophagitis: K21.9

## 2017-10-29 HISTORY — DX: Pneumonia, unspecified organism: J18.9

## 2017-10-29 HISTORY — DX: Anemia, unspecified: D64.9

## 2017-10-29 HISTORY — DX: Other specified postprocedural states: Z98.890

## 2017-10-29 HISTORY — DX: Headache: R51

## 2017-10-29 HISTORY — DX: Nausea with vomiting, unspecified: R11.2

## 2017-10-29 HISTORY — DX: Headache, unspecified: R51.9

## 2017-10-29 LAB — CBC
HCT: 37.4 % (ref 35.0–47.0)
HEMOGLOBIN: 12.7 g/dL (ref 12.0–16.0)
MCH: 30.6 pg (ref 26.0–34.0)
MCHC: 33.8 g/dL (ref 32.0–36.0)
MCV: 90.5 fL (ref 80.0–100.0)
PLATELETS: 205 10*3/uL (ref 150–440)
RBC: 4.13 MIL/uL (ref 3.80–5.20)
RDW: 13.5 % (ref 11.5–14.5)
WBC: 8.1 10*3/uL (ref 3.6–11.0)

## 2017-10-29 LAB — SURGICAL PCR SCREEN
MRSA, PCR: NEGATIVE
Staphylococcus aureus: NEGATIVE

## 2017-10-29 LAB — RAPID HIV SCREEN (HIV 1/2 AB+AG)
HIV 1/2 ANTIBODIES: NONREACTIVE
HIV-1 P24 ANTIGEN - HIV24: NONREACTIVE

## 2017-10-29 MED ORDER — CEFAZOLIN SODIUM-DEXTROSE 2-4 GM/100ML-% IV SOLN: 2.0000 g | INTRAVENOUS | Status: DC

## 2017-10-29 NOTE — Telephone Encounter (Signed)
Patient was seen at Pre admit for c-section tomorrow and nurse there wanted her to let Dr Valentino Saxonherry know that she has chest congestion and nasal congestion - cold systems and yellow mucus   Please call patient since the nurse was insistent that she call for Dr Valentino Saxonherry to know this

## 2017-10-29 NOTE — Patient Instructions (Signed)
Your procedure is scheduled on:October 30, 2017 (FRIDAY ) Report to EMERGENCY DEPARTMENT ARRIVAL TIME 5:30 AM.  Remember: Instructions that are not followed completely may result in serious medical risk, up to and including death, or upon the discretion of your surgeon and anesthesiologist your surgery may need to be rescheduled.     _X__ 1. Do not eat food after midnight the night before your procedure.                 No gum chewing or hard candies. You may drink clear liquids up to 2 hours                 before you are scheduled to arrive for your surgery- DO not drink clear                 liquids within 2 hours of the start of your surgery.                 Clear Liquids include:  water, apple juice without pulp, clear carbohydrate                 drink such as Clearfast of Gartorade, Black Coffee or Tea (Do not add                 anything to coffee or tea).     _X__ 2.  No Alcohol for 24 hours before or after surgery.   _X__ 3.  Do Not Smoke or use e-cigarettes For 24 Hours Prior to Your Surgery.                 Do not use any chewable tobacco products for at least 6 hours prior to                 surgery.  ____  4.  Bring all medications with you on the day of surgery if instructed.   __X__  5.  Notify your doctor if there is any change in your medical condition      (cold, fever, infections).     Do not wear jewelry, make-up, hairpins, clips or nail polish. Do not wear lotions, powders, or perfumes.  Do not shave 48 hours prior to surgery. Men may shave face and neck. Do not bring valuables to the hospital.    [facility] any belongings or valuables.  Contacts, dentures or bridgework may not be worn into surgery. Leave your suitcase in the car. After surgery it may be brought to your room. For patients admitted to the hospital, discharge time is determined by your treatment team.   Patients discharged the day of surgery will  not be allowed to drive home.   Please read over the following fact sheets that you were given:   MRSA Information          ____ Take these medicines the morning of surgery with A SIP OF WATER:    1.   2.   3.   4.  5.  6.  ____ Fleet Enema (as directed)   __X__ Use CHG Soap as directed (AVOID CONTACT WITH BREAST )  ____ Use inhalers on the day of surgery  ____ Stop metformin 2 days prior to surgery    ____ Take 1/2 of usual insulin dose the night before surgery. No insulin the morning          of surgery.   __X__ Stop Coumadin/Plavix/aspirin on (NO ASPIRIN )  __X__ Stop Anti-inflammatories on (NO ASPIRIN PRODUCTS )   ____ Stop supplements until after surgery.    ____ Bring C-Pap to the hospital.

## 2017-10-29 NOTE — Pre-Procedure Instructions (Signed)
Patient arrived at Pre-Admit Testing for pre-op visit for C-Section for tomorrow (10-30-17 ) stating she has a productive cough, expectorating yellow mucus. .Afebrile.  Instructed to notify Dr. Valentino Saxonherry today.

## 2017-10-29 NOTE — Telephone Encounter (Signed)
[facility] try mucinex or sudafed for her sx. Pt states she has been taking tylenol and afrin. Pt voices understanding.

## 2017-10-30 ENCOUNTER — Inpatient Hospital Stay
Admission: EM | Admit: 2017-10-30 | Discharge: 2017-11-01 | DRG: 785 | Disposition: A | Discharge status: Home / Self Care | Payer: BLUE CROSS/BLUE SHIELD | Attending: Obstetrics and Gynecology | Admitting: Obstetrics and Gynecology

## 2017-10-30 ENCOUNTER — Inpatient Hospital Stay: Payer: BLUE CROSS/BLUE SHIELD | Admitting: Anesthesiology

## 2017-10-30 ENCOUNTER — Inpatient Hospital Stay
Admission: RE | Admit: 2017-10-30 | Payer: BLUE CROSS/BLUE SHIELD | Source: Ambulatory Visit | Admitting: Obstetrics and Gynecology

## 2017-10-30 ENCOUNTER — Encounter
Admission: EM | Disposition: A | Discharge status: Home / Self Care | Payer: Self-pay | Source: Home / Self Care | Attending: Obstetrics and Gynecology

## 2017-10-30 ENCOUNTER — Encounter: Payer: Self-pay | Admitting: *Deleted

## 2017-10-30 ENCOUNTER — Other Ambulatory Visit: Payer: Self-pay

## 2017-10-30 DIAGNOSIS — F419 Anxiety disorder, unspecified: Secondary | ICD-10-CM | POA: Diagnosis not present

## 2017-10-30 DIAGNOSIS — E669 Obesity, unspecified: Secondary | ICD-10-CM | POA: Diagnosis not present

## 2017-10-30 DIAGNOSIS — Z3A39 39 weeks gestation of pregnancy: Secondary | ICD-10-CM

## 2017-10-30 DIAGNOSIS — J45909 Unspecified asthma, uncomplicated: Secondary | ICD-10-CM | POA: Diagnosis not present

## 2017-10-30 DIAGNOSIS — O99344 Other mental disorders complicating childbirth: Secondary | ICD-10-CM | POA: Diagnosis not present

## 2017-10-30 DIAGNOSIS — O9952 Diseases of the respiratory system complicating childbirth: Secondary | ICD-10-CM | POA: Diagnosis not present

## 2017-10-30 DIAGNOSIS — O99210 Obesity complicating pregnancy, unspecified trimester: Secondary | ICD-10-CM

## 2017-10-30 DIAGNOSIS — Z302 Encounter for sterilization: Secondary | ICD-10-CM | POA: Diagnosis not present

## 2017-10-30 DIAGNOSIS — Z87891 Personal history of nicotine dependence: Secondary | ICD-10-CM

## 2017-10-30 DIAGNOSIS — Z6791 Unspecified blood type, Rh negative: Secondary | ICD-10-CM

## 2017-10-30 DIAGNOSIS — O34211 Maternal care for low transverse scar from previous cesarean delivery: Secondary | ICD-10-CM | POA: Diagnosis not present

## 2017-10-30 DIAGNOSIS — F329 Major depressive disorder, single episode, unspecified: Secondary | ICD-10-CM | POA: Diagnosis present

## 2017-10-30 DIAGNOSIS — O99214 Obesity complicating childbirth: Secondary | ICD-10-CM | POA: Diagnosis not present

## 2017-10-30 DIAGNOSIS — Z98891 History of uterine scar from previous surgery: Secondary | ICD-10-CM

## 2017-10-30 DIAGNOSIS — O403XX0 Polyhydramnios, third trimester, not applicable or unspecified: Secondary | ICD-10-CM

## 2017-10-30 DIAGNOSIS — O26893 Other specified pregnancy related conditions, third trimester: Secondary | ICD-10-CM | POA: Diagnosis present

## 2017-10-30 DIAGNOSIS — O34219 Maternal care for unspecified type scar from previous cesarean delivery: Secondary | ICD-10-CM | POA: Diagnosis not present

## 2017-10-30 DIAGNOSIS — F32A Depression, unspecified: Secondary | ICD-10-CM | POA: Diagnosis present

## 2017-10-30 LAB — TYPE AND SCREEN
ABO/RH(D): A NEG
ANTIBODY SCREEN: POSITIVE
Extend sample reason: UNDETERMINED
UNIT DIVISION: 0
UNIT DIVISION: 0

## 2017-10-30 LAB — BPAM RBC
BLOOD PRODUCT EXPIRATION DATE: 201812072359
Blood Product Expiration Date: 201812072359
UNIT TYPE AND RH: 600
Unit Type and Rh: 600

## 2017-10-30 LAB — RPR: RPR Ser Ql: NONREACTIVE

## 2017-10-30 SURGERY — Surgical Case
Anesthesia: Spinal | Laterality: Bilateral | Wound class: Clean Contaminated

## 2017-10-30 MED ORDER — ONDANSETRON HCL 4 MG/2ML IJ SOLN
INTRAMUSCULAR | Status: DC | PRN
  Administered 2017-10-30: 4 mg via INTRAVENOUS

## 2017-10-30 MED ORDER — MENTHOL 3 MG MT LOZG: 1.0000 | LOZENGE | OROMUCOSAL | Status: DC | PRN

## 2017-10-30 MED ORDER — COCONUT OIL OIL
1.0000 "application " | TOPICAL_OIL | Status: DC | PRN
  Administered 2017-10-30: 1 via TOPICAL
  Filled 2017-10-30: qty 120

## 2017-10-30 MED ORDER — ONDANSETRON HCL 4 MG/2ML IJ SOLN: 4.0000 mg | Freq: Three times a day (TID) | INTRAMUSCULAR | Status: DC | PRN

## 2017-10-30 MED ORDER — PRENATAL MULTIVITAMIN CH
1.0000 | ORAL_TABLET | Freq: Every day | ORAL | Status: DC
  Administered 2017-10-30 – 2017-11-01 (×3): 1 via ORAL
  Filled 2017-10-30 (×3): qty 1

## 2017-10-30 MED ORDER — NALBUPHINE HCL 10 MG/ML IJ SOLN
5.0000 mg | INTRAMUSCULAR | Status: DC | PRN
  Filled 2017-10-30: qty 0.5

## 2017-10-30 MED ORDER — ONDANSETRON 4 MG PO TBDP
4.0000 mg | ORAL_TABLET | Freq: Three times a day (TID) | ORAL | Status: AC
  Administered 2017-10-30 – 2017-10-31 (×4): 4 mg via ORAL
  Filled 2017-10-30 (×4): qty 1

## 2017-10-30 MED ORDER — SOD CITRATE-CITRIC ACID 500-334 MG/5ML PO SOLN
ORAL | Status: AC
  Administered 2017-10-30: 30 mL
  Filled 2017-10-30: qty 15

## 2017-10-30 MED ORDER — NALOXONE HCL 0.4 MG/ML IJ SOLN
0.4000 mg | INTRAMUSCULAR | Status: DC | PRN
  Filled 2017-10-30: qty 1

## 2017-10-30 MED ORDER — WITCH HAZEL-GLYCERIN EX PADS: 1.0000 "application " | MEDICATED_PAD | CUTANEOUS | Status: DC | PRN

## 2017-10-30 MED ORDER — EPHEDRINE SULFATE 50 MG/ML IJ SOLN
INTRAMUSCULAR | Status: DC | PRN
  Administered 2017-10-30: 5 mg via INTRAVENOUS

## 2017-10-30 MED ORDER — SIMETHICONE 80 MG PO CHEW
80.0000 mg | CHEWABLE_TABLET | ORAL | Status: DC | PRN
  Administered 2017-10-31: 80 mg via ORAL
  Filled 2017-10-30: qty 1

## 2017-10-30 MED ORDER — PHENYLEPHRINE HCL 10 MG/ML IJ SOLN
INTRAMUSCULAR | Status: DC | PRN
  Administered 2017-10-30 (×3): 100 ug via INTRAVENOUS
  Administered 2017-10-30: 200 ug via INTRAVENOUS
  Administered 2017-10-30 (×5): 100 ug via INTRAVENOUS

## 2017-10-30 MED ORDER — LIDOCAINE 5 % EX PTCH
1.0000 | MEDICATED_PATCH | CUTANEOUS | Status: DC
  Administered 2017-10-31 – 2017-11-01 (×2): 1 via TRANSDERMAL
  Filled 2017-10-30 (×2): qty 1

## 2017-10-30 MED ORDER — OXYTOCIN 40 UNITS IN LACTATED RINGERS INFUSION - SIMPLE MED
INTRAVENOUS | Status: DC | PRN
  Administered 2017-10-30: 500 mL via INTRAVENOUS

## 2017-10-30 MED ORDER — OXYCODONE-ACETAMINOPHEN 5-325 MG PO TABS: 2.0000 | ORAL_TABLET | ORAL | Status: DC | PRN

## 2017-10-30 MED ORDER — OXYTOCIN 40 UNITS IN LACTATED RINGERS INFUSION - SIMPLE MED
INTRAVENOUS | Status: AC
  Filled 2017-10-30: qty 1000

## 2017-10-30 MED ORDER — NALBUPHINE HCL 10 MG/ML IJ SOLN
5.0000 mg | Freq: Once | INTRAMUSCULAR | Status: DC | PRN
  Filled 2017-10-30: qty 0.5

## 2017-10-30 MED ORDER — KETOROLAC TROMETHAMINE 30 MG/ML IJ SOLN
30.0000 mg | Freq: Four times a day (QID) | INTRAMUSCULAR | Status: AC | PRN
  Administered 2017-10-30 – 2017-10-31 (×4): 30 mg via INTRAVENOUS
  Filled 2017-10-30 (×3): qty 1

## 2017-10-30 MED ORDER — FENTANYL CITRATE (PF) 100 MCG/2ML IJ SOLN
INTRAMUSCULAR | Status: DC | PRN
  Administered 2017-10-30: 35 ug via INTRAVENOUS
  Administered 2017-10-30 (×2): 25 ug via INTRAVENOUS
  Administered 2017-10-30: 15 ug via INTRATHECAL

## 2017-10-30 MED ORDER — BUPIVACAINE IN DEXTROSE 0.75-8.25 % IT SOLN
INTRATHECAL | Status: DC | PRN
  Administered 2017-10-30: 1.8 mL via INTRATHECAL

## 2017-10-30 MED ORDER — OXYTOCIN 40 UNITS IN LACTATED RINGERS INFUSION - SIMPLE MED
2.5000 [IU]/h | INTRAVENOUS | Status: AC
  Administered 2017-10-30: 2.5 [IU]/h via INTRAVENOUS

## 2017-10-30 MED ORDER — LACTATED RINGERS IV SOLN
INTRAVENOUS | Status: DC
  Administered 2017-10-31: 01:00:00 via INTRAVENOUS

## 2017-10-30 MED ORDER — ONDANSETRON HCL 4 MG/2ML IJ SOLN: 4.0000 mg | Freq: Once | INTRAMUSCULAR | Status: DC | PRN

## 2017-10-30 MED ORDER — SCOPOLAMINE 1 MG/3DAYS TD PT72: 1.0000 | MEDICATED_PATCH | Freq: Once | TRANSDERMAL | Status: DC

## 2017-10-30 MED ORDER — MEPERIDINE HCL 25 MG/ML IJ SOLN
6.2500 mg | INTRAMUSCULAR | Status: DC | PRN
  Filled 2017-10-30: qty 1

## 2017-10-30 MED ORDER — DIPHENHYDRAMINE HCL 25 MG PO CAPS: 25.0000 mg | ORAL_CAPSULE | ORAL | Status: DC | PRN

## 2017-10-30 MED ORDER — SODIUM CHLORIDE 0.9% FLUSH: 3.0000 mL | INTRAVENOUS | Status: DC | PRN

## 2017-10-30 MED ORDER — CEFAZOLIN SODIUM 1 G IJ SOLR
2.0000 g | Freq: Once | INTRAMUSCULAR | Status: DC
  Filled 2017-10-30: qty 20

## 2017-10-30 MED ORDER — ACETAMINOPHEN 325 MG PO TABS
650.0000 mg | ORAL_TABLET | ORAL | Status: DC | PRN
  Administered 2017-10-30 – 2017-11-01 (×10): 650 mg via ORAL
  Filled 2017-10-30 (×10): qty 2

## 2017-10-30 MED ORDER — RHO D IMMUNE GLOBULIN 1500 UNIT/2ML IJ SOSY
300.0000 ug | PREFILLED_SYRINGE | Freq: Once | INTRAMUSCULAR | Status: AC
  Administered 2017-10-31: 300 ug via INTRAVENOUS
  Filled 2017-10-30: qty 2

## 2017-10-30 MED ORDER — DIPHENHYDRAMINE HCL 25 MG PO CAPS: 25.0000 mg | ORAL_CAPSULE | Freq: Four times a day (QID) | ORAL | Status: DC | PRN

## 2017-10-30 MED ORDER — KETOROLAC TROMETHAMINE 30 MG/ML IJ SOLN
30.0000 mg | Freq: Four times a day (QID) | INTRAMUSCULAR | Status: DC | PRN
  Filled 2017-10-30: qty 1

## 2017-10-30 MED ORDER — NALBUPHINE HCL 10 MG/ML IJ SOLN
5.0000 mg | INTRAMUSCULAR | Status: DC | PRN
Start: 2017-10-30 — End: 2017-10-30
  Filled 2017-10-30: qty 0.5

## 2017-10-30 MED ORDER — ZOLPIDEM TARTRATE 5 MG PO TABS: 5.0000 mg | ORAL_TABLET | Freq: Every evening | ORAL | Status: DC | PRN

## 2017-10-30 MED ORDER — KETOROLAC TROMETHAMINE 30 MG/ML IJ SOLN
30.0000 mg | Freq: Four times a day (QID) | INTRAMUSCULAR | Status: DC | PRN
  Administered 2017-10-30: 30 mg via INTRAVENOUS
  Filled 2017-10-30 (×2): qty 1

## 2017-10-30 MED ORDER — CEFAZOLIN SODIUM-DEXTROSE 2-3 GM-%(50ML) IV SOLR
INTRAVENOUS | Status: DC | PRN
  Administered 2017-10-30: 2 g via INTRAVENOUS

## 2017-10-30 MED ORDER — LACTATED RINGERS IV SOLN
INTRAVENOUS | Status: DC
  Administered 2017-10-30: 07:00:00 via INTRAVENOUS

## 2017-10-30 MED ORDER — MORPHINE SULFATE (PF) 0.5 MG/ML IJ SOLN
INTRAMUSCULAR | Status: DC | PRN
  Administered 2017-10-30: .1 mg via INTRATHECAL

## 2017-10-30 MED ORDER — SENNOSIDES-DOCUSATE SODIUM 8.6-50 MG PO TABS
2.0000 | ORAL_TABLET | ORAL | Status: DC
  Administered 2017-10-31 – 2017-11-01 (×2): 2 via ORAL
  Filled 2017-10-30 (×2): qty 2

## 2017-10-30 MED ORDER — OXYCODONE-ACETAMINOPHEN 5-325 MG PO TABS: 1.0000 | ORAL_TABLET | ORAL | Status: DC | PRN

## 2017-10-30 MED ORDER — DIBUCAINE 1 % RE OINT: 1.0000 "application " | TOPICAL_OINTMENT | RECTAL | Status: DC | PRN

## 2017-10-30 MED ORDER — MEASLES, MUMPS & RUBELLA VAC ~~LOC~~ INJ
0.5000 mL | INJECTION | Freq: Once | SUBCUTANEOUS | Status: DC
  Filled 2017-10-30: qty 0.5

## 2017-10-30 MED ORDER — IBUPROFEN 600 MG PO TABS
600.0000 mg | ORAL_TABLET | Freq: Four times a day (QID) | ORAL | Status: DC
  Administered 2017-10-31 – 2017-11-01 (×4): 600 mg via ORAL
  Filled 2017-10-30 (×4): qty 1

## 2017-10-30 MED ORDER — LIDOCAINE 5 % EX PTCH
1.0000 | MEDICATED_PATCH | CUTANEOUS | Status: DC
  Filled 2017-10-30: qty 1

## 2017-10-30 MED ORDER — LIDOCAINE 5 % EX PTCH
MEDICATED_PATCH | CUTANEOUS | Status: DC | PRN
  Administered 2017-10-30: 1 via TRANSDERMAL

## 2017-10-30 MED ORDER — OXYCODONE HCL 5 MG PO TABS
5.0000 mg | ORAL_TABLET | ORAL | Status: DC | PRN
  Filled 2017-10-30: qty 1

## 2017-10-30 MED ORDER — FENTANYL CITRATE (PF) 100 MCG/2ML IJ SOLN: 25.0000 ug | INTRAMUSCULAR | Status: DC | PRN

## 2017-10-30 MED ORDER — SIMETHICONE 80 MG PO CHEW
80.0000 mg | CHEWABLE_TABLET | ORAL | Status: DC
  Administered 2017-10-31 – 2017-11-01 (×2): 80 mg via ORAL
  Filled 2017-10-30 (×2): qty 1

## 2017-10-30 MED ORDER — MAGNESIUM HYDROXIDE 400 MG/5ML PO SUSP
30.0000 mL | ORAL | Status: DC | PRN
  Filled 2017-10-30: qty 30

## 2017-10-30 MED ORDER — FAMOTIDINE 20 MG PO TABS
20.0000 mg | ORAL_TABLET | Freq: Two times a day (BID) | ORAL | Status: AC
  Administered 2017-10-30: 20 mg via ORAL
  Filled 2017-10-30: qty 1

## 2017-10-30 MED ORDER — FERROUS SULFATE 325 (65 FE) MG PO TABS
325.0000 mg | ORAL_TABLET | Freq: Two times a day (BID) | ORAL | Status: DC
  Administered 2017-10-30 – 2017-11-01 (×4): 325 mg via ORAL
  Filled 2017-10-30 (×4): qty 1

## 2017-10-30 MED ORDER — NALOXONE HCL 0.4 MG/ML IJ SOLN
1.0000 ug/kg/h | INTRAVENOUS | Status: DC | PRN
  Filled 2017-10-30: qty 5

## 2017-10-30 MED ORDER — DIPHENHYDRAMINE HCL 50 MG/ML IJ SOLN: 12.5000 mg | INTRAMUSCULAR | Status: DC | PRN

## 2017-10-30 MED ORDER — SERTRALINE HCL 100 MG PO TABS
200.0000 mg | ORAL_TABLET | Freq: Every day | ORAL | Status: DC
  Administered 2017-10-30: 200 mg via ORAL
  Filled 2017-10-30 (×2): qty 2

## 2017-10-30 MED ORDER — LACTATED RINGERS IV SOLN
Freq: Once | INTRAVENOUS | Status: AC
  Administered 2017-10-30: 06:00:00 via INTRAVENOUS

## 2017-10-30 SURGICAL SUPPLY — 31 items
BAG COUNTER SPONGE EZ (MISCELLANEOUS) ×2 IMPLANT
CANISTER SUCT 3000ML PPV (MISCELLANEOUS) ×2 IMPLANT
CHLORAPREP W/TINT 26ML (MISCELLANEOUS) ×4 IMPLANT
DERMABOND ADVANCED (GAUZE/BANDAGES/DRESSINGS) ×1
DERMABOND ADVANCED .7 DNX12 (GAUZE/BANDAGES/DRESSINGS) ×1 IMPLANT
DRSG TELFA 3X8 NADH (GAUZE/BANDAGES/DRESSINGS) ×2 IMPLANT
ELECT REM PT RETURN 9FT ADLT (ELECTROSURGICAL) ×2
ELECTRODE REM PT RTRN 9FT ADLT (ELECTROSURGICAL) ×1 IMPLANT
GAUZE SPONGE 4X4 12PLY STRL (GAUZE/BANDAGES/DRESSINGS) ×2 IMPLANT
GLOVE BIO SURGEON STRL SZ 6.5 (GLOVE) ×2 IMPLANT
GLOVE INDICATOR 7.0 STRL GRN (GLOVE) ×2 IMPLANT
GOWN STRL REUS W/ TWL LRG LVL3 (GOWN DISPOSABLE) ×2 IMPLANT
GOWN STRL REUS W/TWL LRG LVL3 (GOWN DISPOSABLE) ×2
HANDLE YANKAUER SUCT BULB TIP (MISCELLANEOUS) ×2 IMPLANT
KIT RM TURNOVER STRD PROC AR (KITS) ×2 IMPLANT
NS IRRIG 1000ML POUR BTL (IV SOLUTION) ×2 IMPLANT
PACK C SECTION AR (MISCELLANEOUS) ×2 IMPLANT
PAD OB MATERNITY 4.3X12.25 (PERSONAL CARE ITEMS) ×2 IMPLANT
PAD PREP 24X41 OB/GYN DISP (PERSONAL CARE ITEMS) ×2 IMPLANT
SUCT VACUUM KIWI BELL (SUCTIONS) ×2 IMPLANT
SUT CHROMIC 0 CT 1 (SUTURE) ×6 IMPLANT
SUT MNCRL 4-0 (SUTURE) ×1
SUT MNCRL 4-0 27XMFL (SUTURE) ×1
SUT MNCRL AB 4-0 PS2 18 (SUTURE) ×2 IMPLANT
SUT PLAIN 2 0 XLH (SUTURE) IMPLANT
SUT VIC AB 0 CT1 36 (SUTURE) ×6 IMPLANT
SUT VIC AB 3-0 SH 27 (SUTURE) ×1
SUT VIC AB 3-0 SH 27X BRD (SUTURE) ×1 IMPLANT
SUT VICRYL 3-0 36IN CTB-1 (SUTURE) ×2 IMPLANT
SUTURE MNCRL 4-0 27XMF (SUTURE) ×1 IMPLANT
TUBING CONNECTING 10 (TUBING) ×2 IMPLANT

## 2017-10-30 NOTE — Anesthesia Preprocedure Evaluation (Signed)
Anesthesia Evaluation  Patient identified by MRN, date of birth, ID band Patient awake    Reviewed: Allergy & Precautions, H&P , NPO status , Patient's Chart, lab work & pertinent test results, reviewed documented beta blocker date and time   History of Anesthesia Complications (+) PONV and history of anesthetic complications  Airway Mallampati: III  TM Distance: >3 FB Neck ROM: full    Dental  (+) Dental Advidsory Given, Teeth Intact   Pulmonary neg shortness of breath, asthma , neg COPD, neg recent URI, former smoker,           Cardiovascular Exercise Tolerance: Good negative cardio ROS       Neuro/Psych PSYCHIATRIC DISORDERS negative neurological ROS     GI/Hepatic Neg liver ROS, hiatal hernia, PUD, GERD  ,  Endo/Other  negative endocrine ROS  Renal/GU negative Renal ROS  negative genitourinary   Musculoskeletal   Abdominal   Peds  Hematology  (+) Blood dyscrasia, anemia ,   Anesthesia Other Findings Past Medical History: No date: Allergic rhinitis No date: Anemia     Comment:  during pregnancy No date: Anxiety     Comment:  managed by Psych No date: Asthma No date: Depression     Comment:  managed by psych No date: Erosive esophagitis     Comment:  seen on Colonoscopy No date: GERD (gastroesophageal reflux disease) No date: Headache No date: Heartburn No date: Hiatal hernia     Comment:  shown on colonoscopy No date: IBS (irritable bowel syndrome)     Comment:  per colonoscopy in Feb 2015 No date: MRSA (methicillin resistant Staphylococcus aureus) No date: Panic disorder without agoraphobia     Comment:  managed by Psych 2016: Pneumonia No date: PONV (postoperative nausea and vomiting)   Reproductive/Obstetrics negative OB ROS                             Anesthesia Physical Anesthesia Plan  ASA: II  Anesthesia Plan: Spinal   Post-op Pain Management:     Induction:   PONV Risk Score and Plan: 3 and Treatment may vary due to age or medical condition  Airway Management Planned: Nasal Cannula  Additional Equipment:   Intra-op Plan:   Post-operative Plan:   Informed Consent: I have reviewed the patients History and Physical, chart, labs and discussed the procedure including the risks, benefits and alternatives for the proposed anesthesia with the patient or authorized representative who has indicated his/her understanding and acceptance.   Dental Advisory Given  Plan Discussed with: Anesthesiologist, CRNA and Surgeon  Anesthesia Plan Comments:         Anesthesia Quick Evaluation

## 2017-10-30 NOTE — Op Note (Signed)
Cesarean Section Procedure Note  Indications: history of prior C-section x 1  Pre-operative Diagnosis: 39 week 0 day pregnancy, prior C-section x 1, obesity in pregnancy, polyhydramnios, desiring permanent sterilization  Post-operative Diagnosis: Same  Surgeon: Hildred LaserAnika Jesiah Grismer, MD  Assistants: Surgical tech  Procedure: Repeat low transverse Cesarean Section (vacuum assisted) with bilateral tubal ligation  Anesthesia: Spinal anesthesia  Procedure Details: The patient was seen in the Holding Room. The risks, benefits, complications, treatment options, and expected outcomes were discussed with the patient.  The patient concurred with the proposed plan, giving informed consent.  The site of surgery properly noted/marked. The patient was taken to the Operating Room, identified as Deborah Fields and the procedure verified as C-Section Delivery. A Time Out was held and the above information confirmed.  After induction of anesthesia, the patient was draped and prepped in the usual sterile manner. Anesthesia was tested and noted to be adequate. A Pfannenstiel incision was made and carried down through the subcutaneous tissue to the fascia. Fascial incision was made and extended transversely. The fascia was separated from the underlying rectus tissue superiorly and inferiorly. The peritoneum was identified and entered. Peritoneal incision was extended longitudinally. The utero-vesical peritoneal reflection was incised transversely and the bladder flap was bluntly freed from the lower uterine segment. A low transverse uterine incision was made. Delivered from cephalic presentation was a 3900 gram Female with Apgar scores of 7 at one minute and 9 at five minutes. A Kiwi vacuum was utilized to assist with delivery of the fetal head due to tissue dystocia.  No pop-off's occurred. After the umbilical cord was clamped and cut cord blood was obtained for evaluation. The placenta was removed intact and appeared  normal. The uterus was exteriorized and cleared of all clots and debris. The uterine outline, tubes and ovaries appeared normal.  The uterine incision was closed with running locked sutures of 0-Vicryl.  A second suture of 0-Vicryl was used in an imbricating layer.  Hemostasis was observed.   Attention was then turned to the fallopian tubes, and where the patient's left fallopian tube was identified and grasped with a Babcock clamp.  The tube was then followed out to the fimbria.  The Babcock clamp was then used to grasp the tube approximately 4 cm from the cornual region.  A 3 cm segment of tube was then ligated with a free tie of 0-Chromic using the [place] method and excised.  The right fallopian tube was then ligated in a similar fashion and excised. There was bleeding noted from the mesosalpinx, and the a running suture of 3-0 Vicryl was used to suture the mesosalpinx.  Next the distal segment of the fallopian tube remnant of previous excixion site was then oversewn with the 0-Chromic. The tubal lumens were cauterized bilaterally.  Good hemostasis was noted with bilateral fallopian tubes. The uterus was then returned to the abdomen.   Lavage was carried out until clear. The fascia was then reapproximated with a running suture of 1-0 Vicryl. The subcutaneous fat layer was reapproximated with 3-0 Vicryl. skin was reapproximated with 4-0 Monocryl.  Instrument, sponge, and needle counts were correct prior the abdominal closure and at the conclusion of the case.   Findings: Female infant, cephalic presentation,  grams, with Apgar scores of 7 at one minute and 9 at five minutes. Intact placenta with 3 vessel cord.  Moderate meconium staining, copious amniotic fluid.  The uterine outline, tubes and ovaries appeared normal.   Estimated Blood Loss:  1000  ml      Drains: foley catheter to gravity drainage, 250 ml clear urine at end of the procedure         Total IV Fluids:  1200 ml  Specimens:  Segments of bilateral fallopian tubes and Disposition:  Sent to Pathology         Implants: None         Complications:  None; patient tolerated the procedure well.         Disposition: PACU - hemodynamically stable.         Condition: stable   Hildred Laserherry, Acheron Sugg, MD [facility]

## 2017-10-30 NOTE — H&P (Signed)
Obstetric Preoperative History and Physical  Deborah BrayRebecca H Taillon is a 31 y.o. G2P1001 with IUP at 3228w0d presenting for presenting for scheduled repeat cesarean section with bilateral tubal ligation.  No acute concerns.   Prenatal Course Source of Care: [facility] with onset of care at 8 weeks Pregnancy complications or risks: Patient Active Problem List   Diagnosis Date Noted  . Anxiety and depression 04/24/2017  . H/O cesarean section 04/24/2017  . Obesity in pregnancy 04/24/2017  . Rh negative state in antepartum period 04/24/2017  . Recurrent UTI 01/16/2016  . Headache 12/21/2015  . MRSA (methicillin resistant Staphylococcus aureus)   . Anxiety   . Depression   . Asthma   . Allergic rhinitis   . Panic disorder without agoraphobia   . IBS (irritable bowel syndrome)   . Neuropathy, 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   She plans to breastfeed She desires bilateral tubal ligation for postpartum contraception.   Prenatal labs and studies: ABO, Rh: --/--/A NEG (11/15 1133) Antibody: NEG (11/15 1133) Rubella: 2.46 (04/19 1556) RPR: Non Reactive (11/15 1133)  HBsAg: Negative (04/19 1556)  HIV:   Negative (11/15 1133) OZH:YQMVHQIOGBS:Negative (10/22 1640) 1 hr Glucola  normal Genetic screening normal Anatomy US normal   Past Medical History:  Diagnosis Date  . Allergic rhinitis   . Anemia    during pregnancy  . Anxiety    managed by Psych  . Asthma   . Depression    managed by psych  . Erosive esophagitis    seen on Colonoscopy  . GERD (gastroesophageal reflux disease)   . Headache   . Heartburn   . Hiatal hernia    shown on colonoscopy  . IBS (irritable bowel syndrome)    per colonoscopy in Feb 2015  . MRSA (methicillin resistant Staphylococcus aureus)   . Panic disorder without agoraphobia    managed by Psych  . Pneumonia 2016  . PONV (postoperative nausea and vomiting)     Past Surgical History:  Procedure Laterality Date  . CESAREAN SECTION    .  COLONOSCOPY  Feb 2015   showed IBS, Hiatal Hernia, Erosive reflux esophagitis  . ESOPHAGOGASTRODUODENOSCOPY    . OVARIAN CYST REMOVAL Right   . TONSILLECTOMY    . TONSILLECTOMY      OB History  Gravida Para Term Preterm AB Living  2 1 1     1   SAB TAB Ectopic Multiple Live Births          1    # Outcome Date GA Lbr Len/2nd Weight Sex Delivery Anes PTL Lv  2 Current           1 Term 2012 3729w0d  8 lb 7 oz (3.827 kg) F CS-Unspec Spinal  LIV    Obstetric Comments  G1- Fetal tachycardia, arrest of descent in 2nd stage (pushed for 3 hrs).     Social History   Socioeconomic History  . Marital status: Married    Spouse name: None  . Number of children: None  . Years of education: None  . Highest education level: None  Social Needs  . Financial resource strain: None  . Food insecurity - worry: None  . Food insecurity - inability: None  . Transportation needs - medical: None  . Transportation needs - non-medical: None  Occupational History  . None  Tobacco Use  . Smoking status: Former Smoker    Packs/day: 0.50    Years: 5.00    Pack years: 2.50    Types:  Cigarettes  . Smokeless tobacco: Never Used  . Tobacco comment: quit 10 years  Substance and Sexual Activity  . Alcohol use: No    Alcohol/week: 0.0 oz  . Drug use: No  . Sexual activity: Yes    Birth control/protection: None    Comment: Pregnant   Other Topics Concern  . None  Social History Narrative  . None    Family History  Problem Relation Age of Onset  . Diabetes Mother   . Hypertension Mother   . Hyperlipidemia Mother   . Heart disease Father   . Hypertension Father   . Hyperlipidemia Father   . COPD Maternal Grandmother   . COPD Maternal Grandfather   . Cancer Paternal Grandfather        prostate  . Stroke Neg Hx   . Kidney disease Neg Hx   . Bladder Cancer Neg Hx     Medications Prior to Admission  Medication Sig Dispense Refill Last Dose  . acetaminophen (TYLENOL) 325 MG tablet Take 650  mg every 6 (six) hours as needed by mouth for moderate pain or headache.   Past Week at Unknown time  . dexlansoprazole (DEXILANT) 60 MG capsule Take 1 capsule (60 mg total) by mouth daily. 30 capsule 11 10/29/2017 at Unknown time  . Prenatal Vit-Fe Fumarate-FA (PRENATAL MULTIVITAMIN) TABS tablet Take 1 tablet daily by mouth.    10/29/2017 at Unknown time  . Probiotic Product (SOLUBLE FIBER/PROBIOTICS PO) Take 1 capsule at bedtime by mouth.    10/29/2017 at Unknown time  . sertraline (ZOLOFT) 100 MG tablet Take 150 mg daily by mouth.   3 10/29/2017 at Unknown time  . albuterol (PROAIR HFA) 108 (90 Base) MCG/ACT inhaler Inhale 2 puffs into the lungs every 6 (six) hours as needed. Reported on 03/17/2016 (Patient not taking: Reported on 10/26/2017) 1 Inhaler 2 Not Taking  . Butalbital-APAP-Caffeine 50-325-40 MG capsule Take 1-2 capsules by mouth every 6 (six) hours as needed for headache. (Patient not taking: Reported on 10/05/2017) 30 capsule 3 Not Taking  . Doxylamine-Pyridoxine (DICLEGIS) 10-10 MG TBEC Day 1&2: 2 tab at bed Day 3 if symptoms persits-1 tab am, 2 tab pm Day 4 if symptoms persits- t tab am,  Tab afternoon, 2 tab bed (Patient taking differently: Take 2 tablets at bedtime by mouth. ) 120 tablet 3 Taking  . fluticasone (FLONASE) 50 MCG/ACT nasal spray USE 2 SPRAYS IN BOTH NOSTRILS ONCE A DAY (Patient not taking: Reported on 10/22/2017) 16 g 12 Not Taking  . ondansetron (ZOFRAN) 4 MG tablet TAKE 1 TABLET (4 MG TOTAL) BY MOUTH EVERY 8 (EIGHT) HOURS AS NEEDED FOR NAUSEA OR VOMITING. 30 tablet 2 Taking  . promethazine (PHENERGAN) 25 MG tablet Take 25 mg every 8 (eight) hours as needed by mouth for nausea or vomiting.   Not Taking at Unknown time    Allergies  Allergen Reactions  . Sulfa Antibiotics Rash    Review of Systems: Negative except for what is mentioned in HPI.  Physical Exam: BP 128/81 (BP Location: Left Arm)   Pulse 86   Temp 98.9 F (37.2 C) (Oral)   Resp 18   Ht 5\' 7"   (1.702 m)   Wt 220 lb (99.8 kg)   LMP 11/02/2016 (Exact Date)   BMI 34.46 kg/m  FHR by Doppler: 130 bpm GENERAL: Well-developed, well-nourished female in no acute distress.  LUNGS: Clear to auscultation bilaterally.  HEART: Regular rate and rhythm. ABDOMEN: Soft, nontender, nondistended, gravid, well-healed Pfannenstiel incision. PELVIC:  Deferred EXTREMITIES: Nontender, no edema, 2+ distal pulses.   Pertinent Labs/Studies:   Results for orders placed or performed during the hospital encounter of 10/29/17 (from the past 72 hour(s))  CBC     Status: None   Collection Time: 10/29/17 11:33 AM  Result Value Ref Range   WBC 8.1 3.6 - 11.0 K/uL   RBC 4.13 3.80 - 5.20 MIL/uL   Hemoglobin 12.7 12.0 - 16.0 g/dL   HCT 16.137.4 09.635.0 - 04.547.0 %   MCV 90.5 80.0 - 100.0 fL   MCH 30.6 26.0 - 34.0 pg   MCHC 33.8 32.0 - 36.0 g/dL   RDW 40.913.5 81.111.5 - 91.414.5 %   Platelets 205 150 - 440 K/uL  Rapid HIV screen (HIV 1/2 Ab+Ag)     Status: None   Collection Time: 10/29/17 11:33 AM  Result Value Ref Range   HIV-1 P24 Antigen - HIV24 NON REACTIVE NON REACTIVE   HIV 1/2 Antibodies NON REACTIVE NON REACTIVE   Interpretation (HIV Ag Ab)      A non reactive test result means that HIV 1 or HIV 2 antibodies and HIV 1 p24 antigen were not detected in the specimen.  RPR     Status: None   Collection Time: 10/29/17 11:33 AM  Result Value Ref Range   RPR Ser Ql Non Reactive Non Reactive    Comment: (NOTE) Performed At: [facility] [address] [place], [place] [id number] Jolene SchimkeNagendra Sanjai MD YQ:[id number]:[phone]   Type and screen     Status: None (Preliminary result)   Collection Time: 10/29/17 11:33 AM  Result Value Ref Range   ABO/RH(D) A NEG    Antibody Screen NEG    Sample Expiration 11/01/2017    Extend sample reason PREGNANT WITHIN 3 MONTHS, UNABLE TO EXTEND    Antibody Identification PASSIVELY ACQUIRED ANTI-D    Unit Number [id number]    Blood Component Type RED CELLS,LR    Unit division 00     Status of Unit ALLOCATED    Transfusion Status OK TO TRANSFUSE    Crossmatch Result COMPATIBLE    Unit Number [id number]    Blood Component Type RBC LR PHER2    Unit division 00    Status of Unit ALLOCATED    Transfusion Status OK TO TRANSFUSE    Crossmatch Result COMPATIBLE   Surgical pcr screen     Status: None   Collection Time: 10/29/17 11:33 AM  Result Value Ref Range   MRSA, PCR NEGATIVE NEGATIVE   Staphylococcus aureus NEGATIVE NEGATIVE    Comment: (NOTE) The Xpert SA Assay (FDA approved for NASAL specimens in patients 33 years of age and older), is one component of a comprehensive surveillance program. It is not intended to diagnose infection nor to guide or monitor treatment.     Assessment and Plan :Deborah Fields is a 31 y.o. G2P1001 at 2050w0d being admitted  for scheduled repeat cesarean section delivery with bilateral tubal ligation. The patient is understanding of the planned procedure and is aware of and accepting of all surgical risks, including but not limited to: bleeding which may require transfusion or reoperation; infection which may require antibiotics; injury to bowel, bladder, ureters or other surrounding organs which may require repair; injury to the fetus; need for additional procedures including hysterectomy in the event of life-threatening complications; placental abnormalities wth subsequent pregnancies; incisional problems; blood clot disorders which may require blood thinners;, and other postoperative/anesthesia complications.  Patient also desires permanent sterilization.  Other reversible forms  of contraception were discussed with patient; she declines all other modalities. Risks of procedure discussed with patient including but not limited to: risk of regret, permanence of method, bleeding, infection, injury to surrounding organs and need for additional procedures.  Failure risk of 1-2 % with increased risk of ectopic gestation if pregnancy occurs was also  discussed with patient.  Patient verbalized understanding of these risks and wants to proceed with sterilization.  Written informed consent obtained.  To OR when ready.   Hildred Laser, MD [facility]

## 2017-10-30 NOTE — Anesthesia Post-op Follow-up Note (Signed)
Anesthesia QCDR form completed.        

## 2017-10-30 NOTE — Anesthesia Procedure Notes (Signed)
Spinal  Patient location during procedure: OR Start time: 10/30/2017 7:57 AM End time: 10/30/2017 7:58 AM Staffing Anesthesiologist: Martha Clan, MD Resident/CRNA: Rolla Plate, CRNA Performed: resident/CRNA  Preanesthetic Checklist Completed: patient identified, site marked, surgical consent, pre-op evaluation, timeout performed, IV checked, risks and benefits discussed and monitors and equipment checked Spinal Block Patient position: sitting Prep: ChloraPrep Patient monitoring: heart rate, continuous pulse ox, blood pressure and cardiac monitor Approach: midline Location: L3-4 Injection technique: single-shot Needle Needle type: Whitacre and Introducer  Needle gauge: 24 G Needle length: 9 cm Assessment Sensory level: T10 Additional Notes Negative paresthesia. Negative blood return. Positive free-flowing CSF. Expiration date of kit checked and confirmed. Patient tolerated procedure well, without complications.

## 2017-10-30 NOTE — Transfer of Care (Signed)
Immediate Anesthesia Transfer of Care Note  Patient: Deborah Fields  Procedure(s) Performed: CESAREAN SECTION WITH BILATERAL TUBAL LIGATION (Bilateral )  Patient Location: Mother/Baby  Anesthesia Type:Spinal  Level of Consciousness: awake, alert  and oriented  Airway & Oxygen Therapy: Patient Spontanous Breathing  Post-op Assessment: Report given to RN and Post -op Vital signs reviewed and stable  Post vital signs: Reviewed  Last Vitals:  Vitals:   10/30/17 0553 10/30/17 0925  BP: 128/81 103/68  Pulse: 86 81  Resp:  18  Temp:  36.7 C  SpO2:  99%    Last Pain:  Vitals:   10/30/17 0606  TempSrc:   PainSc: 4          Complications: No apparent anesthesia complications

## 2017-10-30 NOTE — Plan of Care (Signed)
  Progressing Education: Knowledge of Childbirth will improve 10/30/2017 0644 - Progressing by Candyce ChurnNorris, Amarri Michaelson, RN Ability to make informed decisions regarding treatment and plan of care will improve 10/30/2017 0644 - Progressing by Candyce ChurnNorris, Julanne Schlueter, RN Ability to state and carry out methods to decrease the pain will improve 10/30/2017 0644 - Progressing by Candyce ChurnNorris, Bernise Sylvain, RN Coping: Ability to verbalize concerns and feelings about labor and delivery will improve 10/30/2017 0644 - Progressing by Candyce ChurnNorris, Dorissa Stinnette, RN Role Relationship: Ability to demonstrate positive interaction with the child will improve 10/30/2017 0644 - Progressing by Candyce ChurnNorris, Lanissa Cashen, RN Safety: Risk of complications during labor and delivery will decrease 10/30/2017 0644 - Progressing by Candyce ChurnNorris, Tyreesha Maharaj, RN Education: Knowledge of General Education information will improve 10/30/2017 0644 - Progressing by Candyce ChurnNorris, Leeloo Silverthorne, RN Health Behavior/Discharge Planning: Ability to manage health-related needs will improve 10/30/2017 0644 - Progressing by Candyce ChurnNorris, Sebastion Jun, RN Clinical Measurements: Ability to maintain clinical measurements within normal limits will improve 10/30/2017 0644 - Progressing by Candyce ChurnNorris, Bayla Mcgovern, RN Will remain free from infection 10/30/2017 0644 - Progressing by Candyce ChurnNorris, Ahonesty Woodfin, RN Diagnostic test results will improve 10/30/2017 0644 - Progressing by Candyce ChurnNorris, Asiel Chrostowski, RN Respiratory complications will improve 10/30/2017 0644 - Progressing by Candyce ChurnNorris, Fatimata Talsma, RN Cardiovascular complication will be avoided 10/30/2017 0644 - Progressing by Candyce ChurnNorris, Sindia Kowalczyk, RN Activity: Risk for activity intolerance will decrease 10/30/2017 0644 - Progressing by Candyce ChurnNorris, Jiayi Lengacher, RN Nutrition: Adequate nutrition will be maintained 10/30/2017 0644 - Progressing by Candyce ChurnNorris, Georgio Hattabaugh, RN Coping: Level of anxiety will decrease 10/30/2017 0644 - Progressing by Candyce ChurnNorris, Cyler Kappes,  RN Elimination: Will not experience complications related to bowel motility 10/30/2017 0644 - Progressing by Candyce ChurnNorris, Akash Winski, RN Will not experience complications related to urinary retention 10/30/2017 0644 - Progressing by Candyce ChurnNorris, Taylon Coole, RN Pain Managment: General experience of comfort will improve 10/30/2017 0644 - Progressing by Candyce ChurnNorris, Kimyah Frein, RN Safety: Ability to remain free from injury will improve 10/30/2017 0644 - Progressing by Candyce ChurnNorris, Caledonia Zou, RN Skin Integrity: Risk for impaired skin integrity will decrease 10/30/2017 0644 - Progressing by Candyce ChurnNorris, Shey Yott, RN

## 2017-10-31 LAB — CBC
HEMATOCRIT: 32.1 % — AB (ref 35.0–47.0)
Hemoglobin: 10.9 g/dL — ABNORMAL LOW (ref 12.0–16.0)
MCH: 30.7 pg (ref 26.0–34.0)
MCHC: 33.9 g/dL (ref 32.0–36.0)
MCV: 90.5 fL (ref 80.0–100.0)
PLATELETS: 167 10*3/uL (ref 150–440)
RBC: 3.55 MIL/uL — ABNORMAL LOW (ref 3.80–5.20)
RDW: 13.2 % (ref 11.5–14.5)
WBC: 9.4 10*3/uL (ref 3.6–11.0)

## 2017-10-31 LAB — BASIC METABOLIC PANEL
ANION GAP: 9 (ref 5–15)
BUN: 7 mg/dL (ref 6–20)
CALCIUM: 8.3 mg/dL — AB (ref 8.9–10.3)
CO2: 24 mmol/L (ref 22–32)
CREATININE: 0.68 mg/dL (ref 0.44–1.00)
Chloride: 103 mmol/L (ref 101–111)
GFR calc Af Amer: >60 mL/min (ref >60)
GLUCOSE: 85 mg/dL (ref 65–99)
Potassium: 3.9 mmol/L (ref 3.5–5.1)
Sodium: 136 mmol/L (ref 135–145)

## 2017-10-31 LAB — FETAL SCREEN: Fetal Screen: NEGATIVE

## 2017-10-31 MED ORDER — FAMOTIDINE 20 MG PO TABS
20.0000 mg | ORAL_TABLET | Freq: Two times a day (BID) | ORAL | Status: DC
  Administered 2017-10-31 – 2017-11-01 (×3): 20 mg via ORAL
  Filled 2017-10-31 (×3): qty 1

## 2017-10-31 MED ORDER — SERTRALINE HCL 25 MG PO TABS
150.0000 mg | ORAL_TABLET | Freq: Every day | ORAL | Status: DC
  Administered 2017-10-31: 150 mg via ORAL

## 2017-10-31 MED ORDER — SERTRALINE HCL 50 MG PO TABS
150.0000 mg | ORAL_TABLET | Freq: Every day | ORAL | Status: DC
  Administered 2017-11-01: 150 mg via ORAL
  Filled 2017-10-31: qty 6
  Filled 2017-10-31: qty 3

## 2017-10-31 NOTE — Anesthesia Postprocedure Evaluation (Signed)
Anesthesia Post Note  Patient: Deborah EdisRebecca H Gulden  Procedure(s) Performed: CESAREAN SECTION WITH BILATERAL TUBAL LIGATION (Bilateral )  Patient location during evaluation: PACU Anesthesia Type: Spinal Level of consciousness: oriented and awake and alert Pain management: pain level controlled Vital Signs Assessment: post-procedure vital signs reviewed and stable Respiratory status: spontaneous breathing, respiratory function stable and patient connected to nasal cannula oxygen Cardiovascular status: blood pressure returned to baseline and stable Postop Assessment: no headache, no backache and no apparent nausea or vomiting Anesthetic complications: no     Last Vitals:  Vitals:   10/30/17 2343 10/31/17 0759  BP:  109/73  Pulse: 87 88  Resp: 18 18  Temp: 36.9 C 36.8 C  SpO2: 99% 99%    Last Pain:  Vitals:   10/31/17 1202  TempSrc:   PainSc: 2                  Davis Vannatter S

## 2017-10-31 NOTE — Progress Notes (Signed)
Dr. Valentino Saxonherry returned RN call. RN updated doctor on patient condition and Dr. Valentino Saxonherry en route to evaluate patient.

## 2017-10-31 NOTE — Progress Notes (Signed)
Postpartum Day # 1: Cesarean Delivery (repeat) with BTL  Subjective: Patient reports tolerating PO and no problems voiding.  Pain in controlled with PO meds.  Has not yet passed flatus or had BM.  Has ambulated to restroom.  Complains of leg pain in right calf and around her knee.  Pain is constant and feels like cramping.   Objective: Vital signs in last 24 hours: Temp:  [98.2 F (36.8 C)-98.8 F (37.1 C)] 98.2 F (36.8 C) (11/17 0759) Pulse Rate:  [70-92] 88 (11/17 0759) Resp:  [17-18] 18 (11/17 0759) BP: (96-117)/(55-73) 109/73 (11/17 0759) SpO2:  [99 %-100 %] 99 % (11/17 0759)  Physical Exam:  General: alert and no distress Lungs: clear to auscultation bilaterally Breasts: normal appearance, no masses or tenderness Heart: regular rate and rhythm, S1, S2 normal, no murmur, click, rub or gallop Abdomen: normal findings: bowel sounds normal and soft and appropriately tender at incision site on palpation. Incision: healing well, no significant drainage, no dehiscence, no significant erythema Pelvis: Lochia appropriate, Uterine Fundus firm,  Extremities: DVT Evaluation: Negative Homan's sign. No significant calf/ankle edema. Mild posterior calf tenderness present on left, but no palpable cords.  Recent Labs    10/29/17 1133 10/31/17 0632  HGB 12.7 10.9*  HCT 37.4 32.1*    Assessment/Plan: Status post Cesarean section. Doing well postoperatively.  Advance diet as tolerated Continue PO pain management Discontinue IVF when tolerating regular diet  Breastfeeding, Lactation consult if needed Contraception:  BTL Rh negative, for Rhogam postpartum Continue Zoloft for anxiety.  Will continue to monitor for signs/symptoms of DVT.  Currently symptoms appear more musculoskeletal. Will order electrolytes to check for calcium/potassium deficits.  If pain worsens, or other signs of DVT occur, will order leg dopplers.  Continue current care.   Hildred LaserAnika Kelijah Towry, MD [facility]

## 2017-10-31 NOTE — Progress Notes (Signed)
Patient complaining of leg pain in right calf and around knee. Patient rates this pain 5/10. Patient states pain is constant and feels like cramping but not like a "charlie horse." Right calf does not feel hot to the touch. RN asked patient if she has a history of blood clots. Patient states "no." Pedal pulses present. RN called Dr. Valentino Saxonherry. Waiting for response.

## 2017-11-01 LAB — RHOGAM INJECTION: Unit division: 0

## 2017-11-01 MED ORDER — MAGNESIUM HYDROXIDE 400 MG/5ML PO SUSP: 30.0000 mL | Freq: Every day | ORAL | Status: DC

## 2017-11-01 MED ORDER — OXYCODONE-ACETAMINOPHEN 5-325 MG PO TABS: 1.0000 | ORAL_TABLET | Freq: Four times a day (QID) | ORAL | 0 refills | Status: DC | PRN

## 2017-11-01 MED ORDER — IBUPROFEN 800 MG PO TABS: 800.0000 mg | ORAL_TABLET | Freq: Three times a day (TID) | ORAL | 1 refills | Status: DC | PRN

## 2017-11-01 MED ORDER — DOCUSATE SODIUM 100 MG PO CAPS: 100.0000 mg | ORAL_CAPSULE | Freq: Every day | ORAL | 2 refills | Status: DC | PRN

## 2017-11-01 NOTE — Discharge Summary (Signed)
Obstetric Discharge Summary Reason for Admission: cesarean section (scheduled, repeat) with bilateral tubal ligation.  Prenatal Procedures: ultrasound Intrapartum Procedures: cesarean: low cervical, transverse and tubal ligation Postpartum Procedures: Rho(D) Ig Complications-Operative and Postpartum: none Hemoglobin  Date Value Ref Range Status  10/31/2017 10.9 (L) 12.0 - 16.0 g/dL Final  11/91/478209/17/2018 95.611.5 11.1 - 15.9 g/dL Final   HCT  Date Value Ref Range Status  10/31/2017 32.1 (L) 35.0 - 47.0 % Final   Hematocrit  Date Value Ref Range Status  08/31/2017 33.0 (L) 34.0 - 46.6 % Final    Physical Exam:  .Blood pressure 105/70, pulse 80, temperature 97.9 F (36.6 C), temperature source Oral, resp. rate 18, height 5\' 7"  (1.702 m), weight 220 lb (99.8 kg), last menstrual period 11/02/2016, SpO2 100 %, unknown if currently breastfeeding.  General: alert and no distress Lochia: appropriate Uterine Fundus: firm Incision: healing well, no significant drainage, no dehiscence, no significant erythema DVT Evaluation: No evidence of DVT seen on physical exam. Negative Homan's sign. No cords or calf tenderness. No significant calf/ankle edema.  Discharge Diagnoses: Term Pregnancy-delivered and history of prior C-section x 1  Discharge Information: Date: 11/01/2017 Activity: pelvic rest Diet: routine Medications: PNV, Ibuprofen, Colace and Percocet Condition: stable Instructions: refer to practice specific booklet Discharge to: home   Newborn Data: Live born female  Birth Weight: 8 lb 9.6 oz (3900 g) APGAR: 7, 9  Newborn Delivery   Birth date/time:  10/30/2017 08:22:00 Delivery type:  C-Section, Low Transverse C-section categorization:  Repeat     Home with mother.  Deborah Fields 11/01/2017, 10:03 AM

## 2017-11-01 NOTE — Progress Notes (Signed)
Discharge instructions complete and prescriptions given. Patient verbalizes understanding of teaching. Patient discharged home at 1600.

## 2017-11-02 ENCOUNTER — Telehealth: Payer: Self-pay | Admitting: Obstetrics and Gynecology

## 2017-11-02 DIAGNOSIS — F332 Major depressive disorder, recurrent severe without psychotic features: Secondary | ICD-10-CM | POA: Diagnosis not present

## 2017-11-02 DIAGNOSIS — F410 Panic disorder [episodic paroxysmal anxiety] without agoraphobia: Secondary | ICD-10-CM | POA: Diagnosis not present

## 2017-11-02 DIAGNOSIS — F411 Generalized anxiety disorder: Secondary | ICD-10-CM | POA: Diagnosis not present

## 2017-11-02 LAB — SURGICAL PATHOLOGY

## 2017-11-02 NOTE — Telephone Encounter (Signed)
Pt aware of AC message. She had her appt changed to see Dr Maryruth BunKapur tonite instead of [facility]. NO redness or swelling in her legs. She is taking tums for the calcium. Aware to contact office with an update on her leg.

## 2017-11-02 NOTE — Telephone Encounter (Signed)
Patient called stating she had c section on Friday. She states she is not doing well emotionally and would like a call back from a nurse. Thanks

## 2017-11-02 NOTE — Addendum Note (Signed)
Addendum  created 11/02/17 2005 by Lenard SimmerKarenz, Chiron Campione, MD   Intraprocedure Event edited

## 2017-11-02 NOTE — Telephone Encounter (Signed)
Please advise patient that I would not recommend anything additional at this time, and encourage her to f/u with her therapist as recommended.  If she feels as though her anxiety symptoms are getting worse (or starts to notice SI/HI) despite starting her new medications tomorrow and is not able to be seen sooner by her therapist, then we can try to work her in tomorrow at our office.   With regards to her leg pain, does she notice any swelling or redness associated with her leg since leaving the hospital.  We did discover that her calcium was low, and I encouraged her to increase her calcium (however this is not going cause a dramatic increase overnight).  Also, she is probably a little more mobile and has more activity than she did while in the hospital.  I still think that there is a low suspicion for a blood clot, however if it does not improve by tomorrow we can order for her to get a Doppler study to rule out blood clot.   Dr. Valentino Saxonherry

## 2017-11-02 NOTE — Telephone Encounter (Signed)
Pt is s/p c/s 3 days ago. Since coming home on Sunday at 4, she is suffering from panic attacks and  only sleeping 45 minutes at a time. Total of 3 hours. Waking up in cold sweats. Pt has long hx of panic attacks. Did increase zoloft to 150 mg 1 week prior to delivery. She spoke with Dr. Maryruth BunKapur today. She added Abilify and a low dose of trazodone. She is to f/u with her on Wed. She is not suicidal or homicidal. Pt states Dr. Maryruth BunKapur wanted to make [facility] aware. Pt wants to confirm you don't want her to do anything else. Side note- she was having calf pain at the hospital. Since coming home its worse. Pt aware it may be Tuesday before you get this message. Pls advise.

## 2017-11-03 ENCOUNTER — Other Ambulatory Visit: Payer: Self-pay | Admitting: Certified Nurse Midwife

## 2017-11-03 ENCOUNTER — Ambulatory Visit: Payer: BLUE CROSS/BLUE SHIELD

## 2017-11-03 ENCOUNTER — Other Ambulatory Visit: Payer: Self-pay

## 2017-11-03 ENCOUNTER — Encounter: Payer: Self-pay | Admitting: Obstetrics and Gynecology

## 2017-11-03 DIAGNOSIS — M79661 Pain in right lower leg: Secondary | ICD-10-CM

## 2017-11-03 DIAGNOSIS — F330 Major depressive disorder, recurrent, mild: Secondary | ICD-10-CM | POA: Diagnosis not present

## 2017-11-04 DIAGNOSIS — F410 Panic disorder [episodic paroxysmal anxiety] without agoraphobia: Secondary | ICD-10-CM | POA: Diagnosis not present

## 2017-11-04 DIAGNOSIS — F332 Major depressive disorder, recurrent severe without psychotic features: Secondary | ICD-10-CM | POA: Diagnosis not present

## 2017-11-04 DIAGNOSIS — F411 Generalized anxiety disorder: Secondary | ICD-10-CM | POA: Diagnosis not present

## 2017-11-06 ENCOUNTER — Ambulatory Visit: Admission: RE | Admit: 2017-11-06 | Payer: BLUE CROSS/BLUE SHIELD | Source: Ambulatory Visit

## 2017-11-09 ENCOUNTER — Ambulatory Visit
Admission: RE | Admit: 2017-11-09 | Discharge: 2017-11-09 | Disposition: A | Discharge status: Home / Self Care | Payer: BLUE CROSS/BLUE SHIELD | Source: Ambulatory Visit | Attending: Obstetrics and Gynecology | Admitting: Obstetrics and Gynecology

## 2017-11-09 DIAGNOSIS — M79661 Pain in right lower leg: Secondary | ICD-10-CM | POA: Insufficient documentation

## 2017-11-10 ENCOUNTER — Encounter: Payer: Self-pay | Admitting: Obstetrics and Gynecology

## 2017-11-10 ENCOUNTER — Ambulatory Visit (INDEPENDENT_AMBULATORY_CARE_PROVIDER_SITE_OTHER): Payer: BLUE CROSS/BLUE SHIELD | Admitting: Obstetrics and Gynecology

## 2017-11-10 VITALS — BP 115/80 | HR 102 | Ht 67.0 in | Wt 196.1 lb

## 2017-11-10 DIAGNOSIS — F53 Postpartum depression: Secondary | ICD-10-CM

## 2017-11-10 DIAGNOSIS — Z98891 History of uterine scar from previous surgery: Secondary | ICD-10-CM

## 2017-11-10 DIAGNOSIS — O99345 Other mental disorders complicating the puerperium: Secondary | ICD-10-CM

## 2017-11-10 DIAGNOSIS — Z09 Encounter for follow-up examination after completed treatment for conditions other than malignant neoplasm: Secondary | ICD-10-CM

## 2017-11-10 NOTE — Progress Notes (Signed)
    OBSTETRICS/GYNECOLOGY POST-OPERATIVE CLINIC VISIT  Subjective:     Stark BrayRebecca H Yorio is a 31 y.o. [id number] female who presents to the clinic 1.5 weeks status post repeat C-section with BTL for history C-section, desiring permanent sterilization. Eating a regular diet without difficulty. Bowel movements are normal. Pain is controlled with current analgesics. Medications being used: prescription NSAID's including ibuprofen (Motrin) and narcotic analgesics including oxycodone/acetaminophen (Percocet, Tylox). Uses meds occasionally.   Notes that her calf pain has improved some.  Had left leg doppler studies yesterday due to persistent complaints of leg tenderness since delivery.  Doppler study was negative.   The following portions of the patient's history were reviewed and updated as appropriate: allergies, current medications, past family history, past medical history, past social history, past surgical history and problem list.   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  Had h/o depression prior to pregnancy and was on medications throughout the pregnancy.  Depression worsened signficantly after being discharged home from the hospital. Has been seen several times by her Psychiatrist in the past week and a half for her worsening symptoms.  Medications have been adjusted (Zoloft now at 200 mg), and she has added several new medications (Abilify, and Trazodone).    Objective:    BP 115/80   Pulse (!) 102   Ht 5\' 7"  (1.702 m)   Wt 196 lb 1.6 oz (89 kg)   BMI 30.71 kg/m  General:  alert and no distress  Abdomen: soft, bowel sounds active, non-tender  Incision:   healing well, no drainage, no erythema, no hernia, no seroma, no swelling, no dehiscence, incision well approximated.     Pathology:  A. FALLOPIAN TUBE SEGMENT, LEFT; TUBAL LIGATION:  - FALLOPIAN TUBE WITH  FULL CROSS SECTION OF THE LUMEN EXAMINED.   B. FALLOPIAN TUBE SEGMENT, RIGHT; TUBAL LIGATION:  - FALLOPIAN TUBE WITH FULL CROSS SECTION OF THE LUMEN EXAMINED.   Assessment:    Postoperative course complicated by severe postpartum depression and anxiety.    Plan:   1. Continue any current medications. 2. Wound care discussed. 3. Pathology report discussed. 4. Activity restrictions: no bending, stooping, or squatting, no lifting more than 10 pounds and pelvic rest x 5 weeks 5. Anticipated return to work: 5 weeks. 6. Follow up: 4 weeks for final postpartum visit.  To continue scheduled 1-2 time weekly visits with Psychiatrist for management of depression and anxiety.     Hildred Laserherry, Tanishka Drolet, MD [facility]

## 2017-11-14 DIAGNOSIS — F332 Major depressive disorder, recurrent severe without psychotic features: Secondary | ICD-10-CM | POA: Diagnosis not present

## 2017-11-14 DIAGNOSIS — Z79899 Other long term (current) drug therapy: Secondary | ICD-10-CM | POA: Diagnosis not present

## 2017-11-14 DIAGNOSIS — F410 Panic disorder [episodic paroxysmal anxiety] without agoraphobia: Secondary | ICD-10-CM | POA: Diagnosis not present

## 2017-11-14 DIAGNOSIS — F411 Generalized anxiety disorder: Secondary | ICD-10-CM | POA: Diagnosis not present

## 2017-11-16 DIAGNOSIS — F330 Major depressive disorder, recurrent, mild: Secondary | ICD-10-CM | POA: Diagnosis not present

## 2017-11-17 ENCOUNTER — Ambulatory Visit: Payer: Self-pay | Admitting: Family Medicine

## 2017-11-20 ENCOUNTER — Encounter: Payer: Self-pay | Admitting: Obstetrics and Gynecology

## 2017-11-30 DIAGNOSIS — Z79899 Other long term (current) drug therapy: Secondary | ICD-10-CM | POA: Diagnosis not present

## 2017-11-30 DIAGNOSIS — F411 Generalized anxiety disorder: Secondary | ICD-10-CM | POA: Diagnosis not present

## 2017-11-30 DIAGNOSIS — F332 Major depressive disorder, recurrent severe without psychotic features: Secondary | ICD-10-CM | POA: Diagnosis not present

## 2017-11-30 DIAGNOSIS — F410 Panic disorder [episodic paroxysmal anxiety] without agoraphobia: Secondary | ICD-10-CM | POA: Diagnosis not present

## 2017-12-11 ENCOUNTER — Encounter: Payer: Self-pay | Admitting: Obstetrics and Gynecology

## 2017-12-11 ENCOUNTER — Ambulatory Visit (INDEPENDENT_AMBULATORY_CARE_PROVIDER_SITE_OTHER): Payer: BLUE CROSS/BLUE SHIELD | Admitting: Obstetrics and Gynecology

## 2017-12-11 DIAGNOSIS — F32A Depression, unspecified: Secondary | ICD-10-CM

## 2017-12-11 DIAGNOSIS — F419 Anxiety disorder, unspecified: Secondary | ICD-10-CM

## 2017-12-11 DIAGNOSIS — F329 Major depressive disorder, single episode, unspecified: Secondary | ICD-10-CM

## 2017-12-11 NOTE — Progress Notes (Signed)
   OBSTETRICS POSTPARTUM CLINIC PROGRESS NOTE  Subjective:     Deborah Fields is a 31 y.o. 512P2002 female who presents for a postpartum visit. She is 6 weeks postpartum following a low cervical transverse Cesarean section with bilateral tubal ligation. I have fully reviewed the prenatal and intrapartum course. The delivery was at 39 gestational weeks.  Anesthesia: spinal. Postpartum course has been complicated by postpartum anxiety and depression managed by her psychiatrist. Baby's course has been well. Baby is feeding by bottle. Bleeding: patient has resumed menses, with Patient's last menstrual period was 12/10/2017.Marland Kitchen. Bowel function is normal. Bladder function is normal. Patient is not sexually active. Contraception method desired is tubal ligation. Postpartum depression screening: positive.  The following portions of the patient's history were reviewed and updated as appropriate: allergies, current medications, past family history, past medical history, past social history, past surgical history and problem list.  Review of Systems Pertinent items noted in HPI and remainder of comprehensive ROS otherwise negative.   Objective:    LMP 12/10/2017   General:  alert and no distress   Breasts:  inspection negative, no nipple discharge or bleeding, no masses or nodularity palpable  Lungs: clear to auscultation bilaterally  Heart:  regular rate and rhythm, S1, S2 normal, no murmur, click, rub or gallop  Abdomen: soft, non-tender; bowel sounds normal; no masses,  no organomegaly. Well healed Pfannenstiel incision   Vulva:  normal  Vagina: normal vagina, no discharge, exudate, lesion, or erythema  Cervix:  no cervical motion tenderness and no lesions  Corpus: normal size, contour, position, consistency, mobility, non-tender  Adnexa:  normal adnexa and no mass, fullness, tenderness  Rectal Exam: Not performed.         Labs:  Lab Results  Component Value Date   HGB 10.9 (L) 10/31/2017      Assessment:    Routine postpartum exam.  Postpartum anxiety and depression    Plan:   1. Contraception: tubal ligation 2. Postpartum anxiety and depression 3. Follow up in: 4-6 months for annual exam, or as needed.    Hildred Laserherry, Brewer Hitchman, MD [facility]

## 2017-12-22 DIAGNOSIS — F330 Major depressive disorder, recurrent, mild: Secondary | ICD-10-CM | POA: Diagnosis not present

## 2017-12-31 DIAGNOSIS — F410 Panic disorder [episodic paroxysmal anxiety] without agoraphobia: Secondary | ICD-10-CM | POA: Diagnosis not present

## 2017-12-31 DIAGNOSIS — Z79899 Other long term (current) drug therapy: Secondary | ICD-10-CM | POA: Diagnosis not present

## 2017-12-31 DIAGNOSIS — F332 Major depressive disorder, recurrent severe without psychotic features: Secondary | ICD-10-CM | POA: Diagnosis not present

## 2017-12-31 DIAGNOSIS — F411 Generalized anxiety disorder: Secondary | ICD-10-CM | POA: Diagnosis not present

## 2018-01-13 ENCOUNTER — Ambulatory Visit (INDEPENDENT_AMBULATORY_CARE_PROVIDER_SITE_OTHER): Payer: BLUE CROSS/BLUE SHIELD | Admitting: Obstetrics and Gynecology

## 2018-01-13 ENCOUNTER — Encounter: Payer: Self-pay | Admitting: Obstetrics and Gynecology

## 2018-01-13 VITALS — BP 117/79 | HR 87 | Ht 67.0 in | Wt 198.0 lb

## 2018-01-13 DIAGNOSIS — F419 Anxiety disorder, unspecified: Secondary | ICD-10-CM | POA: Diagnosis not present

## 2018-01-13 DIAGNOSIS — R42 Dizziness and giddiness: Secondary | ICD-10-CM | POA: Diagnosis not present

## 2018-01-13 DIAGNOSIS — N938 Other specified abnormal uterine and vaginal bleeding: Secondary | ICD-10-CM

## 2018-01-13 MED ORDER — TRANEXAMIC ACID 650 MG PO TABS: 1300.0000 mg | ORAL_TABLET | Freq: Three times a day (TID) | ORAL | 2 refills | Status: DC

## 2018-01-13 NOTE — Progress Notes (Signed)
    GYNECOLOGY PROGRESS NOTE  Subjective:    Patient ID: [name], female    DOB: 04/20/1986, 32 y.o.   MRN: [id number]  HPI  Patient is a 32 y.o. 302P2002 female who presents for complaints of heavy menstrual period.  This is her second menstrual cycle since having an uncomplicated C-section ~ 10 weeks ago.  She notes that her first menstrual cycle was normal, moderate flow lasting 4-5 days.  She reports that this current cycle has lasted 6 days so far, and has been extremely heavy, requiring the use of super tampons and overnight pads, changing every hour. Flow has just begun to slow down today. She has also been feeling fatigued and dizzy, and this has been ongoing since the birth of her child. She thought it may have been due to the addition of a new psychiatric medication right after birth for her anxiety, but is unsure. Also notes that her blood pressure seems to be lower today than usual.   The following portions of the patient's history were reviewed and updated as appropriate: allergies, current medications, past family history, past medical history, past social history, past surgical history and problem list.  Review of Systems Pertinent items noted in HPI and remainder of comprehensive ROS otherwise negative.   Objective:   Blood pressure 117/79, pulse 87, height 5\' 7"  (1.702 m), weight 198 lb (89.8 kg), last menstrual period 01/07/2018, unknown if currently breastfeeding. General appearance: alert and no distress Abdomen: soft, non-tender; bowel sounds normal; no masses,  no organomegaly Pelvic: external genitalia normal, rectovaginal septum normal.  Vagina with scant dark red blood in vaginal vault. No discharge seen.  Cervix normal appearing, no lesions and no motion tenderness.  Uterus mobile, nontender, normal shape and size.  Adnexae non-palpable, nontender bilaterally.  Extremities: extremities normal, atraumatic, no cyanosis or edema Neurologic: Grossly  normal   Assessment:   Dysfunctional uterine bleeding Anxiety Dizziness  Plan:   - Discussed possible etiologies, including dysregulation of hormones, medication interference, less likely retained products as patient with normal previous menses and no fever or abdominal pain.  As patient needs to remain on her anxiety medications, I discusses several management options including expectant management (watching to see if next period is also heavy), non-hormonal intervention with Lysteda, or hormonal intervention (Provera tablets to be administered at time of cycle, or beginning some form of hormonal contraception).  Patient would like to try Lysteda, will prescribe.  - Will also check TSH and CBC.  - To f/u if symptoms persist or worsen.    Deborah Fields, Deborah Zoeller, MD [facility]

## 2018-01-13 NOTE — Patient Instructions (Signed)

## 2018-01-14 LAB — CBC
Hematocrit: 34.8 % (ref 34.0–46.6)
Hemoglobin: 12.2 g/dL (ref 11.1–15.9)
MCH: 29 pg (ref 26.6–33.0)
MCHC: 35.1 g/dL (ref 31.5–35.7)
MCV: 83 fL (ref 79–97)
Platelets: 281 10*3/uL (ref 150–379)
RBC: 4.2 x10E6/uL (ref 3.77–5.28)
RDW: 13.5 % (ref 12.3–15.4)
WBC: 5.4 10*3/uL (ref 3.4–10.8)

## 2018-01-14 LAB — TSH: TSH: 1.1 u[IU]/mL (ref 0.450–4.500)

## 2018-01-20 DIAGNOSIS — F410 Panic disorder [episodic paroxysmal anxiety] without agoraphobia: Secondary | ICD-10-CM | POA: Diagnosis not present

## 2018-01-20 DIAGNOSIS — F411 Generalized anxiety disorder: Secondary | ICD-10-CM | POA: Diagnosis not present

## 2018-01-20 DIAGNOSIS — F332 Major depressive disorder, recurrent severe without psychotic features: Secondary | ICD-10-CM | POA: Diagnosis not present

## 2018-02-08 DIAGNOSIS — F330 Major depressive disorder, recurrent, mild: Secondary | ICD-10-CM | POA: Diagnosis not present

## 2018-02-20 DIAGNOSIS — F410 Panic disorder [episodic paroxysmal anxiety] without agoraphobia: Secondary | ICD-10-CM | POA: Diagnosis not present

## 2018-02-20 DIAGNOSIS — F332 Major depressive disorder, recurrent severe without psychotic features: Secondary | ICD-10-CM | POA: Diagnosis not present

## 2018-02-20 DIAGNOSIS — F411 Generalized anxiety disorder: Secondary | ICD-10-CM | POA: Diagnosis not present

## 2018-02-25 ENCOUNTER — Telehealth: Payer: Self-pay | Admitting: Gastroenterology

## 2018-02-25 ENCOUNTER — Other Ambulatory Visit: Payer: Self-pay

## 2018-02-25 ENCOUNTER — Other Ambulatory Visit: Payer: Self-pay | Admitting: Gastroenterology

## 2018-02-25 NOTE — Telephone Encounter (Signed)
*  STAT* If patient is at the pharmacy, call can be transferred to refill team.   1. Which medications need to be refilled? (please list name of each medication and dose if known) Dexilant 60 mg  2. Which pharmacy/location (including street and city if local pharmacy) is medication to be sent to? [facility]  3. Do they need a 30 day or 90 day supply? 90 day

## 2018-02-25 NOTE — Telephone Encounter (Signed)
Pt left vm her rx beeing denied  Due to pt needing an apt, I called her left vm to set up fu with Dr. Servando SnareWohl.

## 2018-03-01 NOTE — Telephone Encounter (Signed)
Pt left vm she states she called last week to gbet refill on rx dexalin she was told she needed apt and scheduled  But still did not get rx refilled apt is in May with Dr. Servando SnareWohl. She needs rx send to [facility] in [place] please call pt once rx is send

## 2018-03-03 NOTE — Telephone Encounter (Signed)
Pt is calling again for refill she needs dexalin 60 mg to [facility] in [place]  [phone] [address] [place]   She already scheduled an appointment and states she needs rx she is feeling uncomfortable

## 2018-03-08 DIAGNOSIS — F330 Major depressive disorder, recurrent, mild: Secondary | ICD-10-CM | POA: Diagnosis not present

## 2018-03-17 ENCOUNTER — Encounter: Payer: Self-pay | Admitting: Obstetrics and Gynecology

## 2018-03-17 DIAGNOSIS — F410 Panic disorder [episodic paroxysmal anxiety] without agoraphobia: Secondary | ICD-10-CM | POA: Diagnosis not present

## 2018-03-17 DIAGNOSIS — F411 Generalized anxiety disorder: Secondary | ICD-10-CM | POA: Diagnosis not present

## 2018-03-17 DIAGNOSIS — F332 Major depressive disorder, recurrent severe without psychotic features: Secondary | ICD-10-CM | POA: Diagnosis not present

## 2018-03-25 ENCOUNTER — Other Ambulatory Visit: Payer: Self-pay | Admitting: Gastroenterology

## 2018-04-07 ENCOUNTER — Encounter: Payer: BLUE CROSS/BLUE SHIELD | Admitting: Obstetrics and Gynecology

## 2018-04-09 DIAGNOSIS — F410 Panic disorder [episodic paroxysmal anxiety] without agoraphobia: Secondary | ICD-10-CM | POA: Diagnosis not present

## 2018-04-09 DIAGNOSIS — F411 Generalized anxiety disorder: Secondary | ICD-10-CM | POA: Diagnosis not present

## 2018-04-09 DIAGNOSIS — F332 Major depressive disorder, recurrent severe without psychotic features: Secondary | ICD-10-CM | POA: Diagnosis not present

## 2018-04-11 ENCOUNTER — Other Ambulatory Visit: Payer: Self-pay | Admitting: Unknown Physician Specialty

## 2018-04-19 DIAGNOSIS — Z872 Personal history of diseases of the skin and subcutaneous tissue: Secondary | ICD-10-CM | POA: Diagnosis not present

## 2018-04-19 DIAGNOSIS — D2371 Other benign neoplasm of skin of right lower limb, including hip: Secondary | ICD-10-CM | POA: Diagnosis not present

## 2018-04-19 DIAGNOSIS — F330 Major depressive disorder, recurrent, mild: Secondary | ICD-10-CM | POA: Diagnosis not present

## 2018-04-19 DIAGNOSIS — Z09 Encounter for follow-up examination after completed treatment for conditions other than malignant neoplasm: Secondary | ICD-10-CM | POA: Diagnosis not present

## 2018-04-20 ENCOUNTER — Encounter: Payer: Self-pay | Admitting: Gastroenterology

## 2018-04-20 ENCOUNTER — Ambulatory Visit: Payer: BLUE CROSS/BLUE SHIELD | Admitting: Gastroenterology

## 2018-04-20 VITALS — BP 112/73 | HR 120 | Ht 67.0 in | Wt 198.0 lb

## 2018-04-20 DIAGNOSIS — K219 Gastro-esophageal reflux disease without esophagitis: Secondary | ICD-10-CM

## 2018-04-20 MED ORDER — DEXLANSOPRAZOLE 60 MG PO CPDR: 60.0000 mg | DELAYED_RELEASE_CAPSULE | Freq: Every day | ORAL | 3 refills | Status: DC

## 2018-04-20 NOTE — Progress Notes (Signed)
Primary Care Physician: Steele Sizer, MD  Primary Gastroenterologist:  Dr. Midge Minium  Chief Complaint  Patient presents with  . Gastroesophageal Reflux    HPI: Deborah Fields is a 32 y.o. female here for follow-up of her heartburn.  The patient recently had a daughter and states that her heartburn was very bad during her pregnancy.  The patient now reports that she has some acid breakthrough and it is treated well with Tums as needed.  The patient is presently on Dexilant and states that this is controlling her symptoms well but she is in need of a refill of her medications.  Since the patient has not been here for some time she was asked to come in.  There is no report of any dysphasia unexplained weight loss fevers chills nausea or vomiting.  She also denies any black stools or bloody stools.  Current Outpatient Medications  Medication Sig Dispense Refill  . clonazePAM (KLONOPIN) 0.5 MG tablet Take 0.5 mg by mouth 2 (two) times daily as needed.  1  . dexlansoprazole (DEXILANT) 60 MG capsule Take 1 capsule (60 mg total) by mouth daily. 90 capsule 3  . Probiotic Product (SOLUBLE FIBER/PROBIOTICS PO) Take 1 capsule at bedtime by mouth.     . sertraline (ZOLOFT) 100 MG tablet Take 200 mg by mouth daily.   3  . albuterol (PROAIR HFA) 108 (90 Base) MCG/ACT inhaler Inhale 2 puffs into the lungs every 6 (six) hours as needed. Reported on 03/17/2016 (Patient not taking: Reported on 04/20/2018) 1 Inhaler 2  . ARIPiprazole (ABILIFY) 5 MG tablet Take 2.5 mg by mouth daily.    . fluticasone (FLONASE) 50 MCG/ACT nasal spray USE 2 SPRAYS IN BOTH NOSTRILS ONCE A DAY (Patient not taking: Reported on 04/20/2018) 16 g 12  . Prenatal Vit-Fe Fumarate-FA (PRENATAL MULTIVITAMIN) TABS tablet Take 1 tablet daily by mouth.     . promethazine (PHENERGAN) 25 MG tablet Take 25 mg every 8 (eight) hours as needed by mouth for nausea or vomiting.    . tranexamic acid (LYSTEDA) 650 MG TABS tablet Take 2 tablets  (1,300 mg total) by mouth 3 (three) times daily. Take during menses for a maximum of five days (Patient not taking: Reported on 04/20/2018) 30 tablet 2   No current facility-administered medications for this visit.     Allergies as of 04/20/2018 - Review Complete 01/13/2018  Allergen Reaction Noted  . Sulfa antibiotics Rash 09/12/2015    ROS:  General: Negative for anorexia, weight loss, fever, chills, fatigue, weakness. ENT: Negative for hoarseness, difficulty swallowing , nasal congestion. CV: Negative for chest pain, angina, palpitations, dyspnea on exertion, peripheral edema.  Respiratory: Negative for dyspnea at rest, dyspnea on exertion, cough, sputum, wheezing.  GI: See history of present illness. GU:  Negative for dysuria, hematuria, urinary incontinence, urinary frequency, nocturnal urination.  Endo: Negative for unusual weight change.    Physical Examination:   BP 112/73   Pulse (!) 120   Ht  (1.702 m)   Wt 198 lb (89.8 kg)   BMI 31.01 kg/m   General: Well-nourished, well-developed in no acute distress.  Eyes: No icterus. Conjunctivae pink. Mouth: Oropharyngeal mucosa moist and pink , no lesions erythema or exudate. Lungs: Clear to auscultation bilaterally. Non-labored. Heart: Regular rate and rhythm, no murmurs rubs or gallops.  Abdomen: Bowel sounds are normal, nontender, nondistended, no hepatosplenomegaly or masses, no abdominal bruits or hernia , no rebound or guarding.   Extremities: No lower  extremity edema. No clubbing or deformities. Neuro: Alert and oriented x 3.  Grossly intact. Skin: Warm and dry, no jaundice.   Psych: Alert and cooperative, normal mood and affect.  Labs:    Imaging Studies: No results found.  Assessment and Plan:   Deborah Fields is a 32 y.o. y/o female who is doing well on her Dexilant with minimal acid breakthrough.  The patient had some significant acid breakthrough during her pregnancy but only has some intermittent  breakthrough at this time. The patient will have her Dexilant refilled.  The patient has been told to contact me if she has any further problems.    Midge Minium, MD. Clementeen Graham   Note: This dictation was prepared with Dragon dictation along with smaller phrase technology. Any transcriptional errors that result from this process are unintentional.

## 2018-04-24 ENCOUNTER — Other Ambulatory Visit: Payer: Self-pay | Admitting: Gastroenterology

## 2018-04-30 ENCOUNTER — Encounter: Payer: BLUE CROSS/BLUE SHIELD | Admitting: Obstetrics and Gynecology

## 2018-05-13 ENCOUNTER — Other Ambulatory Visit: Payer: Self-pay | Admitting: Otolaryngology

## 2018-05-13 DIAGNOSIS — R300 Dysuria: Secondary | ICD-10-CM | POA: Diagnosis not present

## 2018-05-13 DIAGNOSIS — R1314 Dysphagia, pharyngoesophageal phase: Secondary | ICD-10-CM | POA: Diagnosis not present

## 2018-05-13 DIAGNOSIS — N3000 Acute cystitis without hematuria: Secondary | ICD-10-CM | POA: Diagnosis not present

## 2018-05-13 DIAGNOSIS — E041 Nontoxic single thyroid nodule: Secondary | ICD-10-CM | POA: Diagnosis not present

## 2018-05-13 DIAGNOSIS — R198 Other specified symptoms and signs involving the digestive system and abdomen: Secondary | ICD-10-CM

## 2018-05-13 DIAGNOSIS — R1310 Dysphagia, unspecified: Secondary | ICD-10-CM

## 2018-05-13 DIAGNOSIS — R0989 Other specified symptoms and signs involving the circulatory and respiratory systems: Secondary | ICD-10-CM

## 2018-05-17 DIAGNOSIS — F330 Major depressive disorder, recurrent, mild: Secondary | ICD-10-CM | POA: Diagnosis not present

## 2018-05-19 ENCOUNTER — Ambulatory Visit
Admission: RE | Admit: 2018-05-19 | Discharge: 2018-05-19 | Disposition: A | Discharge status: Home / Self Care | Payer: BLUE CROSS/BLUE SHIELD | Source: Ambulatory Visit | Attending: Otolaryngology | Admitting: Otolaryngology

## 2018-05-19 DIAGNOSIS — R1310 Dysphagia, unspecified: Secondary | ICD-10-CM | POA: Diagnosis not present

## 2018-05-19 DIAGNOSIS — E041 Nontoxic single thyroid nodule: Secondary | ICD-10-CM | POA: Diagnosis not present

## 2018-05-19 DIAGNOSIS — E042 Nontoxic multinodular goiter: Secondary | ICD-10-CM | POA: Insufficient documentation

## 2018-05-19 DIAGNOSIS — R198 Other specified symptoms and signs involving the digestive system and abdomen: Secondary | ICD-10-CM

## 2018-05-19 DIAGNOSIS — R0989 Other specified symptoms and signs involving the circulatory and respiratory systems: Secondary | ICD-10-CM

## 2018-05-21 ENCOUNTER — Other Ambulatory Visit: Payer: Self-pay | Admitting: Otolaryngology

## 2018-05-21 DIAGNOSIS — R1310 Dysphagia, unspecified: Secondary | ICD-10-CM

## 2018-05-27 ENCOUNTER — Ambulatory Visit (INDEPENDENT_AMBULATORY_CARE_PROVIDER_SITE_OTHER): Payer: BLUE CROSS/BLUE SHIELD | Admitting: Obstetrics and Gynecology

## 2018-05-27 ENCOUNTER — Encounter: Payer: Self-pay | Admitting: Obstetrics and Gynecology

## 2018-05-27 VITALS — BP 91/63 | HR 98 | Ht 67.0 in | Wt 199.9 lb

## 2018-05-27 DIAGNOSIS — Z01419 Encounter for gynecological examination (general) (routine) without abnormal findings: Secondary | ICD-10-CM | POA: Diagnosis not present

## 2018-05-27 DIAGNOSIS — N920 Excessive and frequent menstruation with regular cycle: Secondary | ICD-10-CM | POA: Diagnosis not present

## 2018-05-27 DIAGNOSIS — E669 Obesity, unspecified: Secondary | ICD-10-CM

## 2018-05-27 DIAGNOSIS — F329 Major depressive disorder, single episode, unspecified: Secondary | ICD-10-CM | POA: Diagnosis not present

## 2018-05-27 DIAGNOSIS — F32A Depression, unspecified: Secondary | ICD-10-CM

## 2018-05-27 DIAGNOSIS — F419 Anxiety disorder, unspecified: Secondary | ICD-10-CM

## 2018-05-27 DIAGNOSIS — N943 Premenstrual tension syndrome: Secondary | ICD-10-CM

## 2018-05-27 DIAGNOSIS — Z13220 Encounter for screening for lipoid disorders: Secondary | ICD-10-CM | POA: Diagnosis not present

## 2018-05-27 NOTE — Progress Notes (Signed)
GYNECOLOGY ANNUAL PHYSICAL EXAM PROGRESS NOTE  Subjective:    Deborah Fields is a 32 y.o. G52P2002 female who presents for an annual exam. The patient is sexually active. The patient wears seatbelts: yes. The patient participates in regular exercise: yes, just began exercising regulaly last week, and has joined [employer]. Has the patient ever been transfused or tattooed?: no. The patient reports that there is not domestic violence in her life.    The patient has the following complaints today:  1. Patient notes that her menstrual cycles are not as heavy as her initial cycle was after her postpartum period, but still are heavy in general. Also notes that she is noting moderate mood swings the week before her cycle.  Is currently on several medications for her anxiety and depression which she is otherwise doing well with.   Gynecologic History  Menarche age: 10 Patient's last menstrual period was 05/25/2018. Contraception: tubal ligation History of STI's: Denies Last Pap: Approximately 07/2016. Results were: normal.  Denies h/o abnormal pap smears.   OB History  Gravida Para Term Preterm AB Living  2 2 2  0 0 2  SAB TAB Ectopic Multiple Live Births  0 0 0 0 2    # Outcome Date GA Lbr Len/2nd Weight Sex Delivery Anes PTL Lv  2 Term 10/30/17 [redacted]w[redacted]d  8 lb 9.6 oz (3.9 kg) F CS-LTranv Spinal  LIV     Name: Mesch,GIRL Jazyah     Apgar1: 7  Apgar5: 9  1 Term 2012 [redacted]w[redacted]d  8 lb 7 oz (3.827 kg) F CS-Unspec Spinal  LIV    Obstetric Comments  G1- Fetal tachycardia, arrest of descent in 2nd stage (pushed for 3 hrs).     Past Medical History:  Diagnosis Date  . Allergic rhinitis   . Anemia    during pregnancy  . Anxiety    managed by Psych  . Asthma   . Depression    managed by psych  . Erosive esophagitis    seen on Colonoscopy  . GERD (gastroesophageal reflux disease)   . Headache   . Heartburn   . Hiatal hernia    shown on colonoscopy  . IBS (irritable bowel syndrome)     per colonoscopy in Feb 2015  . MRSA (methicillin resistant Staphylococcus aureus)   . Panic disorder without agoraphobia    managed by Psych  . Pneumonia 2016  . PONV (postoperative nausea and vomiting)     Past Surgical History:  Procedure Laterality Date  . CESAREAN SECTION    . CESAREAN SECTION WITH BILATERAL TUBAL LIGATION Bilateral 10/30/2017   Procedure: CESAREAN SECTION WITH BILATERAL TUBAL LIGATION;  Surgeon: Hildred Laser, MD;  Location: [facility] ORS;  Service: Obstetrics;  Laterality: Bilateral;  . COLONOSCOPY  Feb 2015   showed IBS, Hiatal Hernia, Erosive reflux esophagitis  . ESOPHAGOGASTRODUODENOSCOPY    . OVARIAN CYST REMOVAL Right   . TONSILLECTOMY    . TONSILLECTOMY      Family History  Problem Relation Age of Onset  . Diabetes Mother   . Hypertension Mother   . Hyperlipidemia Mother   . Heart disease Father   . Hypertension Father   . Hyperlipidemia Father   . COPD Maternal Grandmother   . COPD Maternal Grandfather   . Cancer Paternal Grandfather        prostate  . Stroke Neg Hx   . Kidney disease Neg Hx   . Bladder Cancer Neg Hx     Social  History   Socioeconomic History  . Marital status: Married    Spouse name: Not on file  . Number of children: Not on file  . Years of education: Not on file  . Highest education level: Not on file  Occupational History  . Not on file  Social Needs  . Financial resource strain: Not on file  . Food insecurity:    Worry: Not on file    Inability: Not on file  . Transportation needs:    Medical: Not on file    Non-medical: Not on file  Tobacco Use  . Smoking status: Former Smoker    Packs/day: 0.50    Years: 5.00    Pack years: 2.50    Types: Cigarettes  . Smokeless tobacco: Never Used  . Tobacco comment: quit 10 years  Substance and Sexual Activity  . Alcohol use: No    Alcohol/week: 0.0 oz  . Drug use: No  . Sexual activity: Yes    Birth control/protection: None, Surgical  Lifestyle  . Physical  activity:    Days per week: Not on file    Minutes per session: Not on file  . Stress: Not on file  Relationships  . Social connections:    Talks on phone: Not on file    Gets together: Not on file    Attends religious service: Not on file    Active member of club or organization: Not on file    Attends meetings of clubs or organizations: Not on file    Relationship status: Not on file  . Intimate partner violence:    Fear of current or ex partner: Not on file    Emotionally abused: Not on file    Physically abused: Not on file    Forced sexual activity: Not on file  Other Topics Concern  . Not on file  Social History Narrative  . Not on file    Current Outpatient Medications on File Prior to Visit  Medication Sig Dispense Refill  . albuterol (PROAIR HFA) 108 (90 Base) MCG/ACT inhaler Inhale 2 puffs into the lungs every 6 (six) hours as needed. Reported on 03/17/2016 (Patient not taking: Reported on 04/20/2018) 1 Inhaler 2  . ARIPiprazole (ABILIFY) 5 MG tablet Take 2.5 mg by mouth daily.    . clonazePAM (KLONOPIN) 0.5 MG tablet Take 0.5 mg by mouth 2 (two) times daily as needed.  1  . dexlansoprazole (DEXILANT) 60 MG capsule Take 1 capsule (60 mg total) by mouth daily. 90 capsule 3  . fluticasone (FLONASE) 50 MCG/ACT nasal spray USE 2 SPRAYS IN BOTH NOSTRILS ONCE A DAY (Patient not taking: Reported on 04/20/2018) 16 g 12  . Prenatal Vit-Fe Fumarate-FA (PRENATAL MULTIVITAMIN) TABS tablet Take 1 tablet daily by mouth.     . Probiotic Product (SOLUBLE FIBER/PROBIOTICS PO) Take 1 capsule at bedtime by mouth.     . promethazine (PHENERGAN) 25 MG tablet Take 25 mg every 8 (eight) hours as needed by mouth for nausea or vomiting.    . sertraline (ZOLOFT) 100 MG tablet Take 200 mg by mouth daily.   3  . tranexamic acid (LYSTEDA) 650 MG TABS tablet Take 2 tablets (1,300 mg total) by mouth 3 (three) times daily. Take during menses for a maximum of five days (Patient not taking: Reported on 04/20/2018)  30 tablet 2   No current facility-administered medications on file prior to visit.     Allergies  Allergen Reactions  . Sulfa Antibiotics Rash  Review of Systems Constitutional: negative for chills, fatigue, fevers and sweats Eyes: negative for irritation, redness and visual disturbance Ears, nose, mouth, throat, and face: negative for hearing loss, nasal congestion, snoring and tinnitus Respiratory: negative for asthma, cough, sputum Cardiovascular: negative for chest pain, dyspnea, exertional chest pressure/discomfort, irregular heart beat, palpitations and syncope Gastrointestinal: negative for abdominal pain, change in bowel habits, nausea and vomiting Genitourinary: negative for abnormal menstrual periods, genital lesions, sexual problems and vaginal discharge, dysuria and urinary incontinence Integument/breast: negative for breast lump, breast tenderness and nipple discharge Hematologic/lymphatic: negative for bleeding and easy bruising Musculoskeletal:negative for back pain and muscle weakness Neurological: negative for dizziness, headaches, vertigo and weakness Endocrine: negative for diabetic symptoms including polydipsia, polyuria and skin dryness Allergic/Immunologic: negative for hay fever and urticaria    Psychological: positive for - anxiety, depression (well controlled on medications) and mood swings. Negative for - concentration difficulties, obsessive thoughts or suicidal ideation    Objective:  Blood pressure 91/63, pulse 98, height 5\' 7"  (1.702 m), weight 199 lb 14.4 oz (90.7 kg), last menstrual period 05/25/2018, unknown if currently breastfeeding. Body mass index is 31.31 kg/m.   General Appearance:    Alert, cooperative, no distress, appears stated age, mild obesity  Head:    Normocephalic, without obvious abnormality, atraumatic  Eyes:    PERRL, conjunctiva/corneas clear, EOM's intact, both eyes  Ears:    Normal external ear canals, both ears  Nose:    Nares normal, septum midline, mucosa normal, no drainage or sinus tenderness  Throat:   Lips, mucosa, and tongue normal; teeth and gums normal  Neck:   Supple, symmetrical, trachea midline, no adenopathy; thyroid: no enlargement/tenderness/nodules; no carotid bruit or JVD  Back:     Symmetric, no curvature, ROM normal, no CVA tenderness  Lungs:     Clear to auscultation bilaterally, respirations unlabored  Chest Wall:    No tenderness or deformity   Heart:    Regular rate and rhythm, S1 and S2 normal, no murmur, rub or gallop  Breast Exam:    No tenderness, masses, or nipple abnormality  Abdomen:     Soft, non-tender, bowel sounds active all four quadrants, no masses, no organomegaly.    Genitalia:    Pelvic:external genitalia normal, vagina without lesions, discharge, or tenderness, rectovaginal septum  normal. Cervix normal in appearance, no cervical motion tenderness, no adnexal masses or tenderness.  Uterus normal size, shape, mobile, regular contours, nontender.  Rectal:    Normal external sphincter.  No hemorrhoids appreciated. Internal exam not done.   Extremities:   Extremities normal, atraumatic, no cyanosis or edema  Pulses:   2+ and symmetric all extremities  Skin:   Skin color, texture, turgor normal, no rashes or lesions  Lymph nodes:   Cervical, supraclavicular, and axillary nodes normal  Neurologic:   CNII-XII intact, normal strength, sensation and reflexes throughout   .  Labs:  Lab Results  Component Value Date   WBC 5.4 01/13/2018   HGB 12.2 01/13/2018   HCT 34.8 01/13/2018   MCV 83 01/13/2018   PLT 281 01/13/2018    Lab Results  Component Value Date   CREATININE 0.68 10/31/2017   BUN 7 10/31/2017   NA 136 10/31/2017   K 3.9 10/31/2017   CL 103 10/31/2017   CO2 24 10/31/2017    Lab Results  Component Value Date   ALT 29 08/31/2017   AST 24 08/31/2017   ALKPHOS 82 08/31/2017   BILITOT <0.2 08/31/2017  Lab Results  Component Value Date   TSH 1.100  01/13/2018     Assessment:    Healthy female exam.   Anxiety and depression PMS symptoms Heavy menstrual cycles Mild obesity  Plan:     Blood tests: CBC with diff, Comprehensive metabolic panel and Lipoproteins. Breast self exam technique reviewed and patient encouraged to perform self-exam monthly. Contraception: tubal ligation. Discussed healthy lifestyle modifications. Pap smear up to date.  Next due in 2020.   Given sample of Lo-Loestrin for PMS symptoms and heavy menses (was previously on this prior to her last pregnancy for contraception as well as management of her menstrual cycles). To notify MD next month if cycles and PMS symptoms responded, and can call in prescription.  Follow up in 1 year.   Hildred Laser, MD [facility]

## 2018-05-27 NOTE — Patient Instructions (Signed)
Preventive Care 18-39 Years, Female Preventive care refers to lifestyle choices and visits with your health care provider that can promote health and wellness. What does preventive care include?  A yearly physical exam. This is also called an annual well check.  Dental exams once or twice a year.  Routine eye exams. Ask your health care provider how often you should have your eyes checked.  Personal lifestyle choices, including: ? Daily care of your teeth and gums. ? Regular physical activity. ? Eating a healthy diet. ? Avoiding tobacco and drug use. ? Limiting alcohol use. ? Practicing safe sex. ? Taking vitamin and mineral supplements as recommended by your health care provider. What happens during an annual well check? The services and screenings done by your health care provider during your annual well check will depend on your age, overall health, lifestyle risk factors, and family history of disease. Counseling Your health care provider may ask you questions about your:  Alcohol use.  Tobacco use.  Drug use.  Emotional well-being.  Home and relationship well-being.  Sexual activity.  Eating habits.  Work and work Statistician.  Method of birth control.  Menstrual cycle.  Pregnancy history.  Screening You may have the following tests or measurements:  Height, weight, and BMI.  Diabetes screening. This is done by checking your blood sugar (glucose) after you have not eaten for a while (fasting).  Blood pressure.  Lipid and cholesterol levels. These may be checked every 5 years starting at age 38.  Skin check.  Hepatitis C blood test.  Hepatitis B blood test.  Sexually transmitted disease (STD) testing.  BRCA-related cancer screening. This may be done if you have a family history of breast, ovarian, tubal, or peritoneal cancers.  Pelvic exam and Pap test. This may be done every 3 years starting at age 38. Starting at age 30, this may be done  every 5 years if you have a Pap test in combination with an HPV test.  Discuss your test results, treatment options, and if necessary, the need for more tests with your health care provider. Vaccines Your health care provider may recommend certain vaccines, such as:  Influenza vaccine. This is recommended every year.  Tetanus, diphtheria, and acellular pertussis (Tdap, Td) vaccine. You may need a Td booster every 10 years.  Varicella vaccine. You may need this if you have not been vaccinated.  HPV vaccine. If you are 39 or younger, you may need three doses over 6 months.  Measles, mumps, and rubella (MMR) vaccine. You may need at least one dose of MMR. You may also need a second dose.  Pneumococcal 13-valent conjugate (PCV13) vaccine. You may need this if you have certain conditions and were not previously vaccinated.  Pneumococcal polysaccharide (PPSV23) vaccine. You may need one or two doses if you smoke cigarettes or if you have certain conditions.  Meningococcal vaccine. One dose is recommended if you are age 68-21 years and a first-year college student living in a residence hall, or if you have one of several medical conditions. You may also need additional booster doses.  Hepatitis A vaccine. You may need this if you have certain conditions or if you travel or work in places where you may be exposed to hepatitis A.  Hepatitis B vaccine. You may need this if you have certain conditions or if you travel or work in places where you may be exposed to hepatitis B.  Haemophilus influenzae type b (Hib) vaccine. You may need this  if you have certain risk factors.  Talk to your health care provider about which screenings and vaccines you need and how often you need them. This information is not intended to replace advice given to you by your health care provider. Make sure you discuss any questions you have with your health care provider. Document Released: 01/27/2002 Document Revised:  08/20/2016 Document Reviewed: 10/02/2015 Elsevier Interactive Patient Education  2018 [employer]  

## 2018-05-27 NOTE — Progress Notes (Signed)
Pt is present today for her annual exam. Pt stated that she was doing well and have no concerns.

## 2018-05-28 LAB — COMPREHENSIVE METABOLIC PANEL
ALBUMIN: 4.5 g/dL (ref 3.5–5.5)
ALK PHOS: 81 IU/L (ref 39–117)
ALT: 30 IU/L (ref 0–32)
AST: 31 IU/L (ref 0–40)
Albumin/Globulin Ratio: 1.9 (ref 1.2–2.2)
BILIRUBIN TOTAL: 0.2 mg/dL (ref 0.0–1.2)
BUN / CREAT RATIO: 21 (ref 9–23)
BUN: 17 mg/dL (ref 6–20)
CHLORIDE: 108 mmol/L — AB (ref 96–106)
CO2: 20 mmol/L (ref 20–29)
Calcium: 9 mg/dL (ref 8.7–10.2)
Creatinine, Ser: 0.8 mg/dL (ref 0.57–1.00)
GFR calc Af Amer: 111 mL/min/{1.73_m2} (ref >59)
GFR calc non Af Amer: 96 mL/min/{1.73_m2} (ref >59)
GLOBULIN, TOTAL: 2.4 g/dL (ref 1.5–4.5)
GLUCOSE: 92 mg/dL (ref 65–99)
Potassium: 4.3 mmol/L (ref 3.5–5.2)
Sodium: 140 mmol/L (ref 134–144)
TOTAL PROTEIN: 6.9 g/dL (ref 6.0–8.5)

## 2018-05-28 LAB — LIPID PANEL
Chol/HDL Ratio: 7.4 ratio — ABNORMAL HIGH (ref 0.0–4.4)
Cholesterol, Total: 228 mg/dL — ABNORMAL HIGH (ref 100–199)
HDL: 31 mg/dL — ABNORMAL LOW (ref >39)
LDL CALC: 135 mg/dL — AB (ref 0–99)
Triglycerides: 309 mg/dL — ABNORMAL HIGH (ref 0–149)
VLDL CHOLESTEROL CAL: 62 mg/dL — AB (ref 5–40)

## 2018-05-28 LAB — CBC
HEMATOCRIT: 37.3 % (ref 34.0–46.6)
HEMOGLOBIN: 12.8 g/dL (ref 11.1–15.9)
MCH: 28.6 pg (ref 26.6–33.0)
MCHC: 34.3 g/dL (ref 31.5–35.7)
MCV: 83 fL (ref 79–97)
Platelets: 254 10*3/uL (ref 150–450)
RBC: 4.47 x10E6/uL (ref 3.77–5.28)
RDW: 13.1 % (ref 12.3–15.4)
WBC: 5.2 10*3/uL (ref 3.4–10.8)

## 2018-06-09 DIAGNOSIS — F410 Panic disorder [episodic paroxysmal anxiety] without agoraphobia: Secondary | ICD-10-CM | POA: Diagnosis not present

## 2018-06-09 DIAGNOSIS — F411 Generalized anxiety disorder: Secondary | ICD-10-CM | POA: Diagnosis not present

## 2018-06-09 DIAGNOSIS — F332 Major depressive disorder, recurrent severe without psychotic features: Secondary | ICD-10-CM | POA: Diagnosis not present

## 2018-06-16 ENCOUNTER — Ambulatory Visit
Admission: RE | Admit: 2018-06-16 | Discharge: 2018-06-16 | Disposition: A | Discharge status: Home / Self Care | Payer: BLUE CROSS/BLUE SHIELD | Source: Ambulatory Visit | Attending: Otolaryngology | Admitting: Otolaryngology

## 2018-06-16 DIAGNOSIS — R1310 Dysphagia, unspecified: Secondary | ICD-10-CM | POA: Diagnosis not present

## 2018-06-16 NOTE — Therapy (Signed)
[facility]IOLOG[facility] [address] [place], [place], [address] Phone: [phone]   Fax:     Modified Barium Swallow  Patient Details  Name: ERILYN PEARMAN MRN: [id number] Date of Birth: 05/04/1986 No data recorded  Encounter Date: 06/16/2018  End of Session - 06/16/18 1357    Visit Number  1    Number of Visits  1    Date for SLP Re-Evaluation  06/16/18    SLP Start Time  1300    SLP Stop Time   1357    SLP Time Calculation (min)  57 min    Activity Tolerance  Patient tolerated treatment well       Past Medical History:  Diagnosis Date  . Allergic rhinitis   . Anemia    during pregnancy  . Anxiety    managed by Psych  . Asthma   . Depression    managed by psych  . Erosive esophagitis    seen on Colonoscopy  . GERD (gastroesophageal reflux disease)   . Headache   . Heartburn   . Hiatal hernia    shown on colonoscopy  . IBS (irritable bowel syndrome)    per colonoscopy in Feb 2015  . MRSA (methicillin resistant Staphylococcus aureus)   . Panic disorder without agoraphobia    managed by Psych  . Pneumonia 2016  . PONV (postoperative nausea and vomiting)     Past Surgical History:  Procedure Laterality Date  . CESAREAN SECTION    . CESAREAN SECTION WITH BILATERAL TUBAL LIGATION Bilateral 10/30/2017   Procedure: CESAREAN SECTION WITH BILATERAL TUBAL LIGATION;  Surgeon: Hildred Laser, MD;  Location: [facility] ORS;  Service: Obstetrics;  Laterality: Bilateral;  . COLONOSCOPY  Feb 2015   showed IBS, Hiatal Hernia, Erosive reflux esophagitis  . ESOPHAGOGASTRODUODENOSCOPY    . OVARIAN CYST REMOVAL Right   . TONSILLECTOMY    . TONSILLECTOMY      There were no vitals filed for this visit.      Subjective: Patient behavior: (alertness, ability to follow instructions, etc.): The patient is able to express her swallowing complaints and follow directions.  Chief complaint: Patient reports thyroid nodules and lordosis  with pressure while swallowing   Objective:  Radiological Procedure: A videoflouroscopic evaluation of oral-preparatory, reflex initiation, and pharyngeal phases of the swallow was performed; as well as a screening of the upper esophageal phase.  I. POSTURE: Upright in MBS chair and standing  II. VIEW: Lateral and A-P  III. COMPENSATORY STRATEGIES: N/A  IV. BOLUSES ADMINISTERED:   Thin Liquid: 1 small, 3 rapid consecutive   Nectar-thick Liquid: 2 moderate   Honey-thick Liquid: DNT   Puree: 2 teaspoon presentations   Mechanical Soft: 1/4 graham cracker in applesauce  V. RESULTS OF EVALUATION: A. ORAL PREPARATORY PHASE: (The lips, tongue, and velum are observed for strength and coordination)       **Overall Severity Rating: Within normal limits  B. SWALLOW INITIATION/REFLEX: (The reflex is normal if "triggered" by the time the bolus reached the base of the tongue)  **Overall Severity Rating: Within normal limits  C. PHARYNGEAL PHASE: (Pharyngeal function is normal if the bolus shows rapid, smooth, and continuous transit through the pharynx and there is no pharyngeal residue after the swallow)  **Overall Severity Rating: Within normal limits  D. LARYNGEAL PENETRATION: (Material entering into the laryngeal inlet/vestibule but not aspirated) None  E. ASPIRATION: None  F. ESOPHAGEAL PHASE: (Screening of the upper esophagus) All boluses move rapidly and  cleanly through th cervical esophagus. An esophageal sweep in the upright position with liquid and solid consistencies showed distal-esophageal retention with retrograde flow below the level of the pharyngoesophageal segment.    ASSESSMENT: This 32 year old woman; with complaint of pressure when swallowing solids and liquids; is presenting with functional oropharyngeal swallowing. Oral control of the bolus including oral hold, rotary mastication, and anterior to posterior transfer is within normal limits.  Timing of pharyngeal swallow  initiation is within normal limits.  Aspects of the pharyngeal stage of swallowing including tongue base retraction, hyolaryngeal excursion, epiglottic inversion, and duration/amplitude of UES opening are within normal limits.  There is no pharyngeal residue, laryngeal penetration, or tracheal aspiration.  All boluses move rapidly and cleanly through the pharynx and cervical esophagus. An esophageal sweep in the upright position with liquid and solid consistencies showed distal-esophageal retention with retrograde flow below the level of the pharyngoesophageal segment.   The patient may benefit from further evaluation with GI to assess esophageal function.  PLAN/RECOMMENDATIONS:   A. Diet: Regular diet   B. Swallowing Precautions: No special precautions indicated   C. Recommended consultation to: follow up with MDs as recommended   D. Therapy recommendations: speech therapy is not indicated   E. Results and recommendations were discussed with the patient immediately following the study and the final report routed to the referring MD.     Dysphagia, unspecified type - Plan: DG Swallowing Func-Speech Pathology, DG Swallowing Func-Speech Pathology        Problem List Patient Active Problem List   Diagnosis Date Noted  . Cesarean delivery delivered 10/30/2017  . S/P cesarean section 10/30/2017  . Polyhydramnios affecting pregnancy in third trimester 10/19/2017  . Anxiety and depression 04/24/2017  . H/O cesarean section 04/24/2017  . Obesity in pregnancy 04/24/2017  . Rh negative state in antepartum period 04/24/2017  . Recurrent UTI 01/16/2016  . Headache 12/21/2015  . MRSA (methicillin resistant Staphylococcus aureus)   . Anxiety   . Depression   . Asthma   . Allergic rhinitis   . Panic disorder without agoraphobia   . IBS (irritable bowel syndrome)   . Neuropathy, 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   Dollene PrimroseSusan G Tristain Daily, MS/CCC- SLP  Leandrew KoyanagiAbernathy, Susie 06/16/2018, 1:57 PM  [facility] [address] [place], [place], [address] Phone: [phone]   Fax:     Name: Stark BrayRebecca H Hansley MRN: [id number] Date of Birth: 04/14/1986

## 2018-06-24 ENCOUNTER — Encounter: Payer: Self-pay | Admitting: Obstetrics and Gynecology

## 2018-06-25 ENCOUNTER — Other Ambulatory Visit: Payer: Self-pay

## 2018-06-25 MED ORDER — NORETHIN-ETH ESTRAD-FE BIPHAS 1 MG-10 MCG / 10 MCG PO TABS: 1.0000 | ORAL_TABLET | Freq: Every day | ORAL | 11 refills | Status: DC

## 2018-07-05 DIAGNOSIS — F330 Major depressive disorder, recurrent, mild: Secondary | ICD-10-CM | POA: Diagnosis not present

## 2018-07-09 ENCOUNTER — Encounter: Payer: Self-pay | Admitting: Obstetrics and Gynecology

## 2018-07-15 ENCOUNTER — Ambulatory Visit: Payer: BLUE CROSS/BLUE SHIELD | Admitting: Gastroenterology

## 2018-08-18 DIAGNOSIS — F330 Major depressive disorder, recurrent, mild: Secondary | ICD-10-CM | POA: Diagnosis not present

## 2018-08-19 ENCOUNTER — Encounter: Payer: Self-pay | Admitting: Obstetrics and Gynecology

## 2018-08-19 ENCOUNTER — Ambulatory Visit: Payer: BLUE CROSS/BLUE SHIELD | Admitting: Obstetrics and Gynecology

## 2018-08-19 VITALS — BP 106/69 | HR 102 | Ht 67.0 in | Wt 202.8 lb

## 2018-08-19 DIAGNOSIS — N938 Other specified abnormal uterine and vaginal bleeding: Secondary | ICD-10-CM

## 2018-08-19 DIAGNOSIS — R4586 Emotional lability: Secondary | ICD-10-CM

## 2018-08-19 DIAGNOSIS — F419 Anxiety disorder, unspecified: Secondary | ICD-10-CM | POA: Diagnosis not present

## 2018-08-19 DIAGNOSIS — F32A Depression, unspecified: Secondary | ICD-10-CM

## 2018-08-19 DIAGNOSIS — F329 Major depressive disorder, single episode, unspecified: Secondary | ICD-10-CM

## 2018-08-19 MED ORDER — LEVONORGEST-ETH ESTRAD 91-DAY 0.15-0.03 MG PO TABS: 1.0000 | ORAL_TABLET | Freq: Every day | ORAL | 4 refills | Status: DC

## 2018-08-19 NOTE — Progress Notes (Signed)
   PT is present today for a medication review. Pt stated that she is having 2 cycles in one month. Pt stated having 1st cycle start on 07/29/18 and the 2nd one started on 08/12/18.  Pt also stated that she is having a lot of mood swings. No other complaints.

## 2018-08-19 NOTE — Progress Notes (Signed)
    GYNECOLOGY PROGRESS NOTE  Subjective:    Patient ID: Deborah Fields, female    DOB: [birth year], 32 y.o.   MRN: [id number]  HPI  Patient is a 32 y.o. G63P2002 female with PMH of depression and anxiety who presents for cramping.  Her cycles have been coming approximately every 2 weeks for the past 3 months that she has been on birth control.  She is currently on low Loestrin.  Previously her cycles were only once monthly but very heavy and lasting 7-8 days. New cycles are shorter, but just occurring now twice a month. She is also still noting significant mood changes right around the time of her menstrual cycle.  Patient notes that the symptoms are different from her symptoms of depression and anxiety.  She is currently being managed for her depression anxiety by her psychiatrist and also sees a Veterinary surgeon.  Patient has noted a problem with high dose birth control options in the past which also caused significant mood swings.   The following portions of the patient's history were reviewed and updated as appropriate: allergies, current medications, past family history, past medical history, past social history, past surgical history and problem list.  Review of Systems Pertinent items noted in HPI and remainder of comprehensive ROS otherwise negative.   Objective:   Blood pressure 106/69, pulse (!) 102, height 5\' 7"  (1.702 m), weight 202 lb 12.8 oz (92 kg), last menstrual period 08/12/2018, unknown if currently breastfeeding. General appearance: alert and no distress Abdomen: soft, non-tender; bowel sounds normal; no masses,  no organomegaly Remainder of exam deferred.    Assessment:   Dysfunctional uterine bleeding History of depression and anxiety Mood changes (related to menstrual cycle)  Plan:   -Discussion had with patient that she has had a long-standing history of irregular patterns of bleeding and mood changes with different forms of contraception.  Reviewed the forms of  contraception the patient has previously used.  After review of these methods, I believe that either a Mirena IUD or continuous OCP regimen may be the best interest for the patient in order to eliminate the number of cycles that she has.  Patient would like to try continuous OCPs.  Will prescribe Seasonale. -History of depression and anxiety, currently on Zoloft and Cymbalta.  Also seeks care regularly with her psychiatrist as well as her counselor.  Notes that her symptoms are currently well managed. -Mood changes, often associated with her menstrual cycle.  Hopefully with the use of continuous OCPs we can reduce the number of cycles and perhaps reduce her symptoms.  -Patient to return in 2 to 3 months after first cycle of continuous OCPs.  A total of 15 minutes were spent face-to-face with the patient during this encounter and over half of that time dealt with counseling and coordination of care.   Hildred Laser, MD [facility]

## 2018-08-25 ENCOUNTER — Other Ambulatory Visit: Payer: Self-pay

## 2018-08-26 ENCOUNTER — Ambulatory Visit: Payer: BLUE CROSS/BLUE SHIELD | Admitting: Gastroenterology

## 2018-09-06 DIAGNOSIS — F332 Major depressive disorder, recurrent severe without psychotic features: Secondary | ICD-10-CM | POA: Diagnosis not present

## 2018-09-06 DIAGNOSIS — F411 Generalized anxiety disorder: Secondary | ICD-10-CM | POA: Diagnosis not present

## 2018-09-06 DIAGNOSIS — F410 Panic disorder [episodic paroxysmal anxiety] without agoraphobia: Secondary | ICD-10-CM | POA: Diagnosis not present

## 2018-09-20 DIAGNOSIS — F330 Major depressive disorder, recurrent, mild: Secondary | ICD-10-CM | POA: Diagnosis not present

## 2018-09-22 DIAGNOSIS — Z23 Encounter for immunization: Secondary | ICD-10-CM | POA: Diagnosis not present

## 2018-10-04 DIAGNOSIS — F330 Major depressive disorder, recurrent, mild: Secondary | ICD-10-CM | POA: Diagnosis not present

## 2018-10-19 ENCOUNTER — Ambulatory Visit (INDEPENDENT_AMBULATORY_CARE_PROVIDER_SITE_OTHER): Payer: BLUE CROSS/BLUE SHIELD | Admitting: Obstetrics and Gynecology

## 2018-10-19 ENCOUNTER — Encounter: Payer: Self-pay | Admitting: Obstetrics and Gynecology

## 2018-10-19 VITALS — BP 102/72 | HR 103 | Ht 67.0 in | Wt 204.1 lb

## 2018-10-19 DIAGNOSIS — R4586 Emotional lability: Secondary | ICD-10-CM | POA: Diagnosis not present

## 2018-10-19 DIAGNOSIS — N943 Premenstrual tension syndrome: Secondary | ICD-10-CM

## 2018-10-19 DIAGNOSIS — N926 Irregular menstruation, unspecified: Secondary | ICD-10-CM

## 2018-10-19 DIAGNOSIS — F419 Anxiety disorder, unspecified: Secondary | ICD-10-CM

## 2018-10-19 DIAGNOSIS — F32A Depression, unspecified: Secondary | ICD-10-CM

## 2018-10-19 DIAGNOSIS — F329 Major depressive disorder, single episode, unspecified: Secondary | ICD-10-CM

## 2018-10-19 NOTE — Progress Notes (Signed)
Pt is present today for a follow up for OCP. Pt stated that she is still having prolong cycles and irregular cycles. No pain, clots, or heavy bleeding with cycle. No other complaints.

## 2018-10-19 NOTE — Progress Notes (Signed)
    GYNECOLOGY PROGRESS NOTE  Subjective:    Patient ID: Deborah Fields, female    DOB: 22-Jul-1986, 32 y.o.   MRN: [id number]  HPI  Patient is a 32 y.o. G102P2002 female who presents for 2 month f/u of irregular menstruation, PMS symptoms with significant mood changes, in setting of history of anxiety and depression.  Patient was changed to Seasonale last visit in hopes of decreasing the number of cycles and heavy irregular prolonged menstruation, as well as improve mood symptoms unrelated to her depression and anxiety.  Patient notes she did not have a cycle in September (went 4 weeks without bleeding), followed by bleeding for 3 weeks in October.  The bleeding however is much lighter than her normal periods, and she also reports less cramping, passage of blood clots. In addition, she notes that her mood was also significantly better.   The following portions of the patient's history were reviewed and updated as appropriate: allergies, current medications, past family history, past medical history, past social history, past surgical history and problem list.  Review of Systems Pertinent items noted in HPI and remainder of comprehensive ROS otherwise negative.   Objective:   Blood pressure 102/72, pulse (!) 103, height 5\' 7"  (1.702 m), weight 204 lb 1.6 oz (92.6 kg), last menstrual period 09/15/2018, unknown if currently breastfeeding. General appearance: alert and no distress Remainder of exam deferred.    Assessment:   Irregular menses  Anxiety and depression  Mood changes  PMS (premenstrual syndrome)   Plan:   - Patient overall beginning to note some improvement in her symptoms. Although the bleeding is still somewhat irregular at this time, the heaviness, and cramping have improved.  Patient is also noting good improvements in her moods.  Will allow an additional 3 month trial of the OCPs to see if continued improvement will be noted.  Otherwise, discussed the option of trying an  IUD to eliminate menstrual cycles.  - RTC in 3 months.

## 2018-11-01 DIAGNOSIS — F330 Major depressive disorder, recurrent, mild: Secondary | ICD-10-CM | POA: Diagnosis not present

## 2018-11-04 DIAGNOSIS — F410 Panic disorder [episodic paroxysmal anxiety] without agoraphobia: Secondary | ICD-10-CM | POA: Diagnosis not present

## 2018-11-04 DIAGNOSIS — F411 Generalized anxiety disorder: Secondary | ICD-10-CM | POA: Diagnosis not present

## 2018-11-04 DIAGNOSIS — F332 Major depressive disorder, recurrent severe without psychotic features: Secondary | ICD-10-CM | POA: Diagnosis not present

## 2018-11-15 DIAGNOSIS — F330 Major depressive disorder, recurrent, mild: Secondary | ICD-10-CM | POA: Diagnosis not present

## 2018-12-20 DIAGNOSIS — F330 Major depressive disorder, recurrent, mild: Secondary | ICD-10-CM | POA: Diagnosis not present

## 2019-01-19 DIAGNOSIS — F330 Major depressive disorder, recurrent, mild: Secondary | ICD-10-CM | POA: Diagnosis not present

## 2019-01-26 ENCOUNTER — Encounter: Payer: BLUE CROSS/BLUE SHIELD | Admitting: Obstetrics and Gynecology

## 2019-02-03 ENCOUNTER — Encounter: Payer: BLUE CROSS/BLUE SHIELD | Admitting: Obstetrics and Gynecology

## 2019-02-21 ENCOUNTER — Encounter: Payer: BLUE CROSS/BLUE SHIELD | Admitting: Obstetrics and Gynecology

## 2019-03-07 DIAGNOSIS — F330 Major depressive disorder, recurrent, mild: Secondary | ICD-10-CM | POA: Diagnosis not present

## 2019-04-03 IMAGING — US US EXTREM LOW VENOUS*R*
1 series · 14 of 24 positions shown · non-contrast
Comparison: None.

CLINICAL DATA: Right calf pain for Tiger days

EXAM:
RIGHT LOWER EXTREMITY VENOUS DUPLEX ULTRASOUND
TECHNIQUE: Doppler venous assessment of the right lower extremity deep venous
system was performed, including characterization of spectral flow,
compressibility, and phasicity.

[Series 1: us extrem low venous*right* · 0.08mm/px · 14 of 40 slices shown]
[im 1/40]
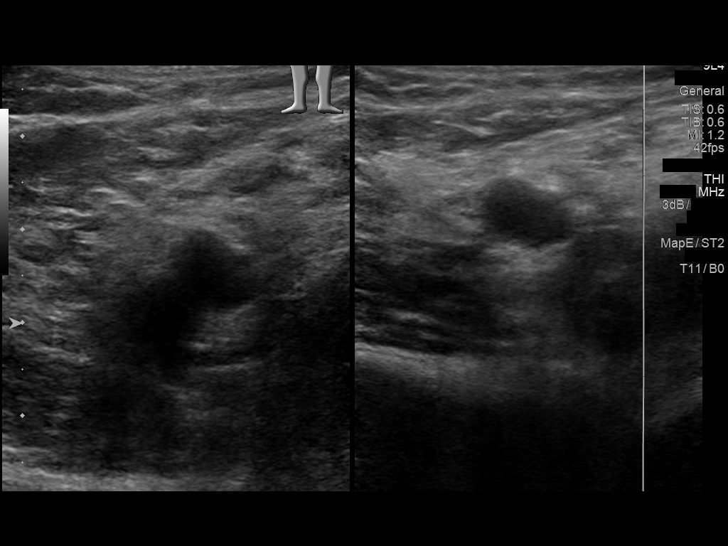
[im 4/40]
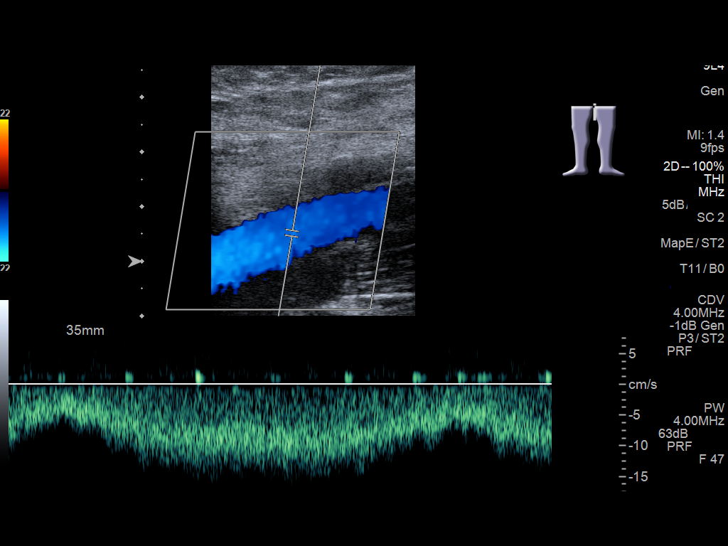
[im 7/40]
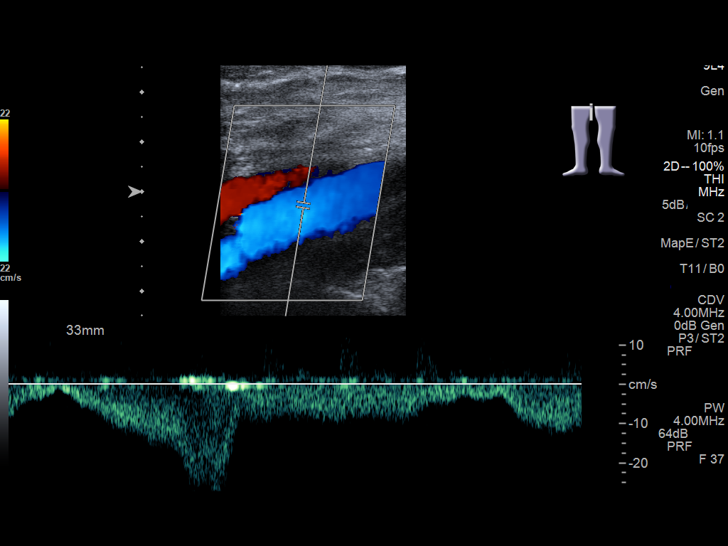
[im 11/40]
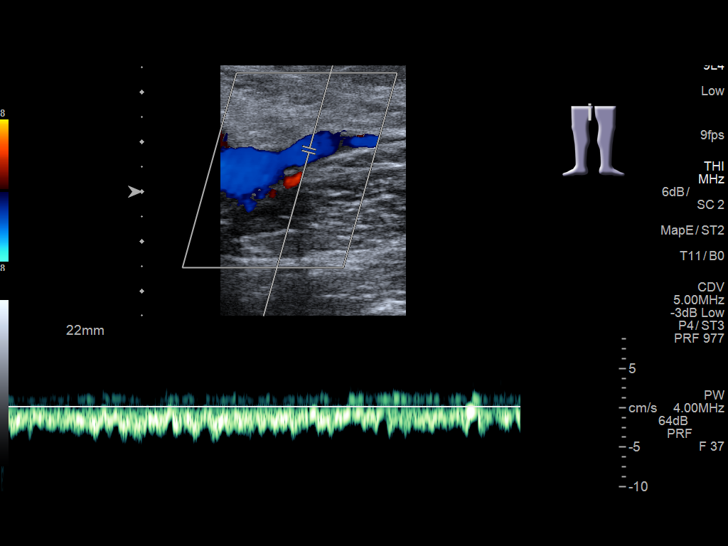
[im 12/40]
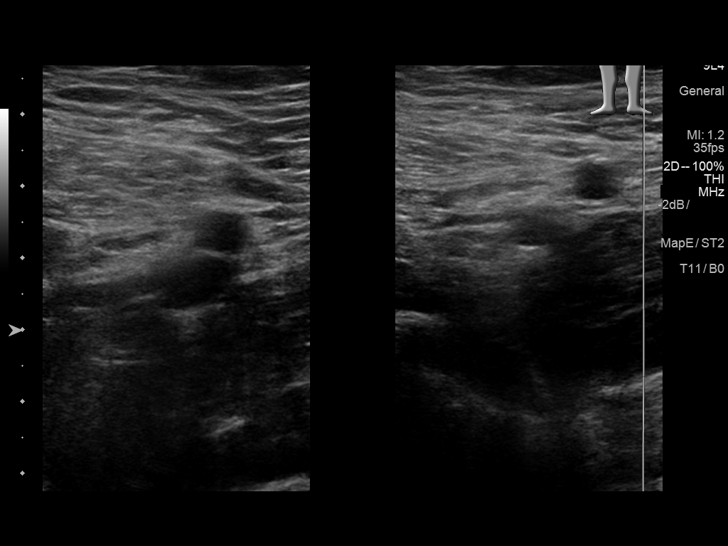
[im 16/40]
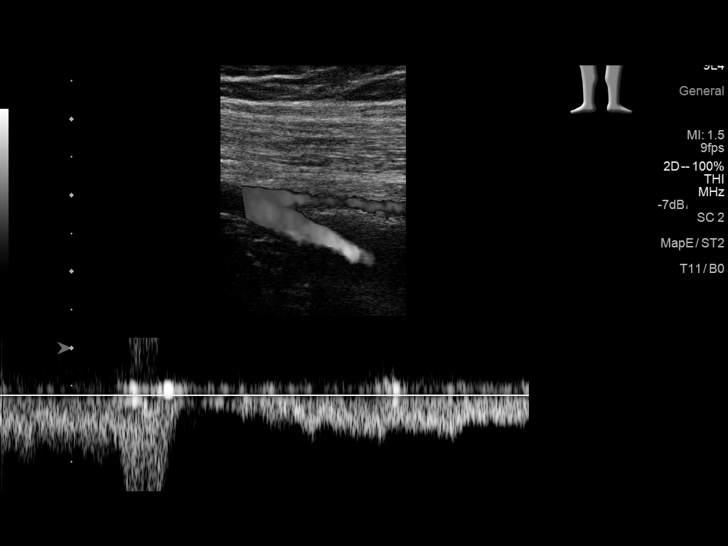
[im 19/40]
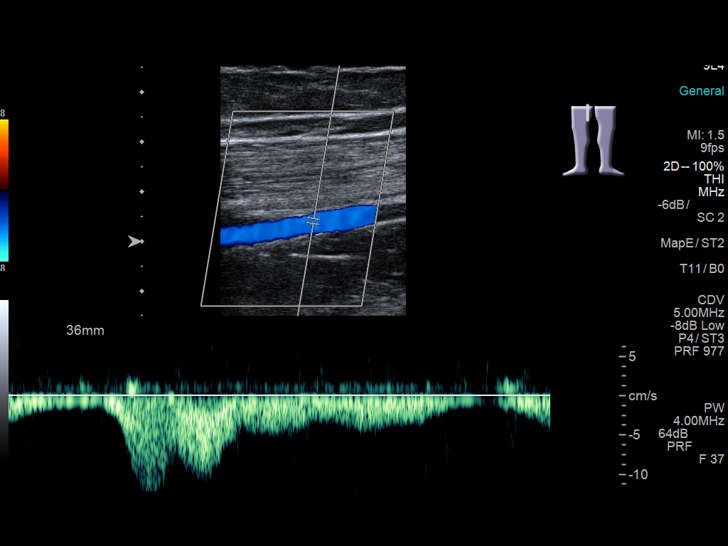
[im 21/40]
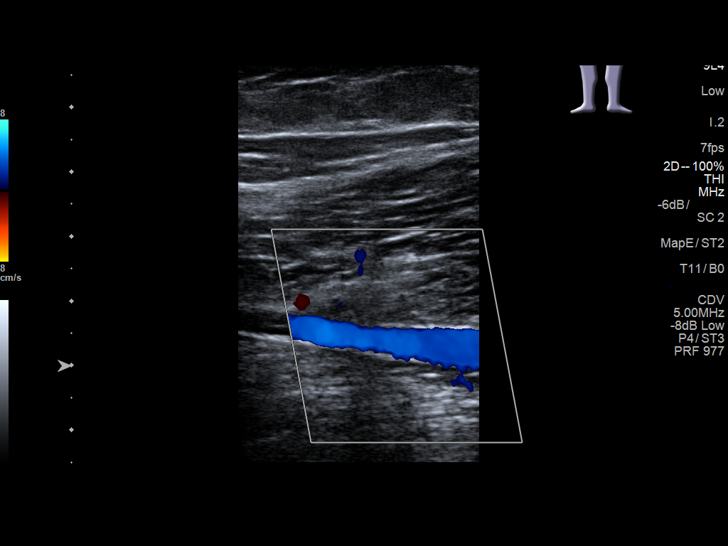
[im 24/40]
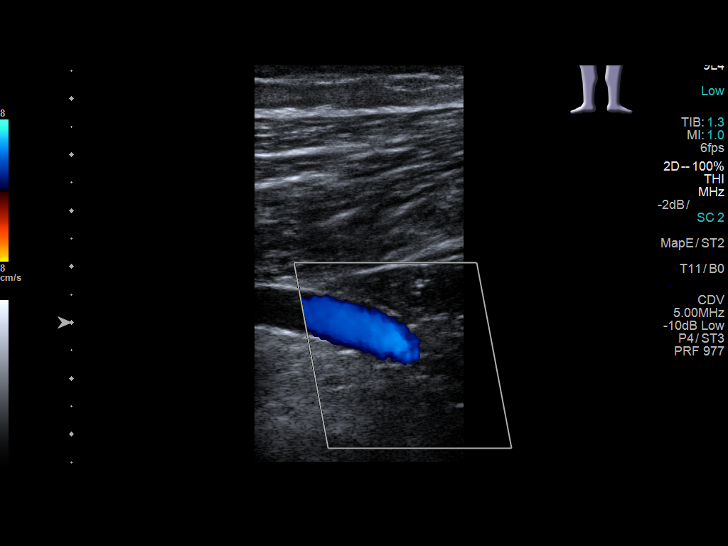
[im 28/40]
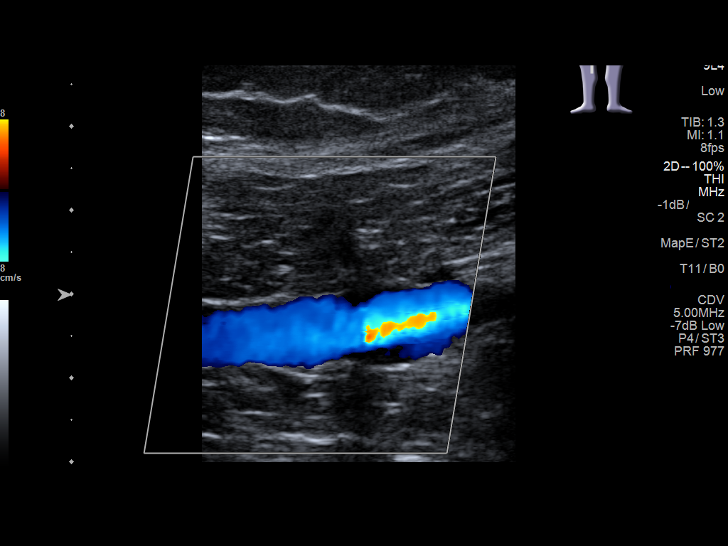
[im 31/40]
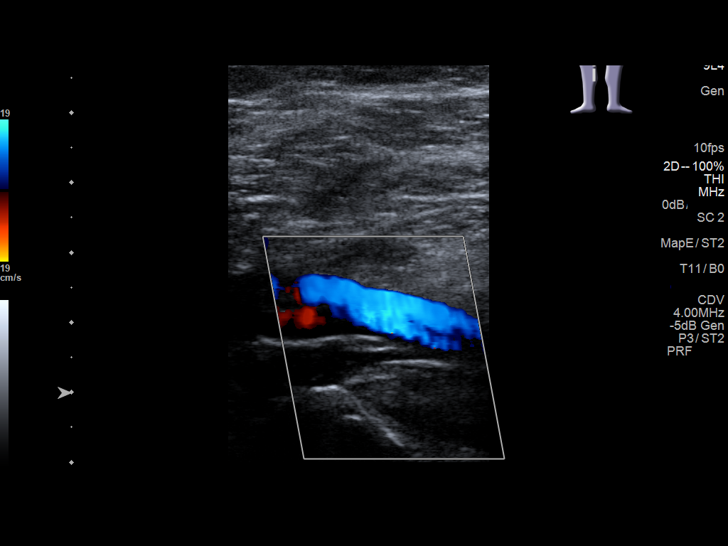
[im 33/40]
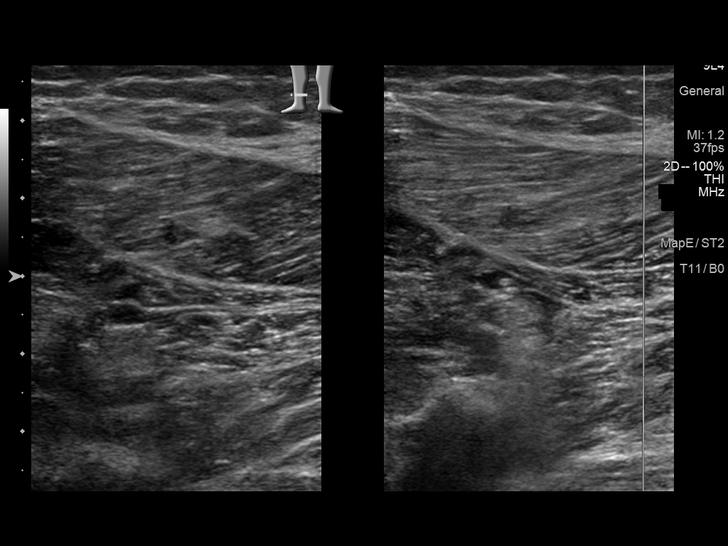
[im 36/40]
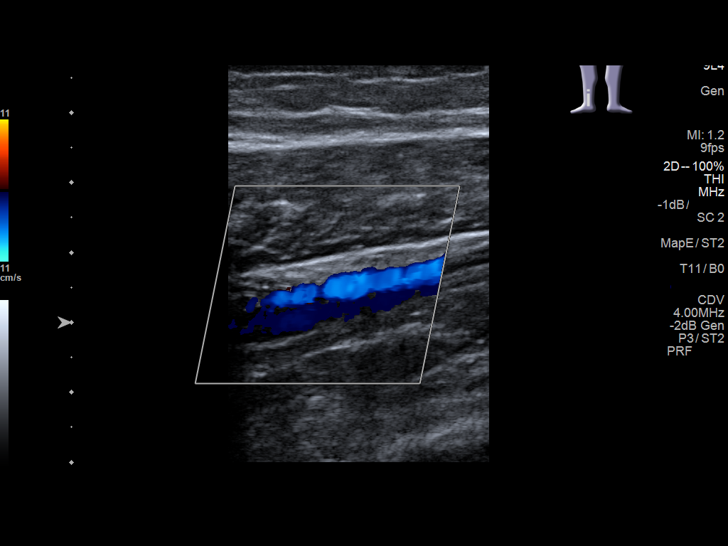
[im 40/40]
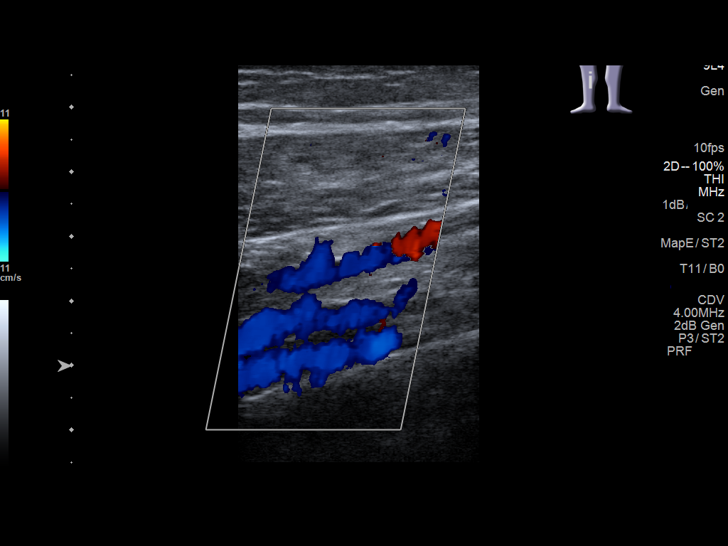

[14 of 24 positions shown; findings below may reference images not displayed]

FINDINGS: There is complete compressibility of the right common femoral,
femoral, and popliteal veins. Doppler analysis demonstrates
respiratory phasicity and augmentation of flow with calf
compression. No obvious superficial vein or calf vein thrombosis.
IMPRESSION: No evidence of right lower extremity DVT.

## 2019-04-04 DIAGNOSIS — F330 Major depressive disorder, recurrent, mild: Secondary | ICD-10-CM | POA: Diagnosis not present

## 2019-04-21 ENCOUNTER — Other Ambulatory Visit: Payer: Self-pay

## 2019-04-21 MED ORDER — FLUTICASONE PROPIONATE 50 MCG/ACT NA SUSP: NASAL | 12 refills | Status: DC

## 2019-04-30 ENCOUNTER — Other Ambulatory Visit: Payer: Self-pay | Admitting: Gastroenterology

## 2019-05-03 ENCOUNTER — Telehealth: Payer: Self-pay | Admitting: Gastroenterology

## 2019-05-03 NOTE — Telephone Encounter (Signed)
Left vm for pt to call office and schedule Virtual visit per pt voicemail request

## 2019-05-03 NOTE — Telephone Encounter (Signed)
PATIENT CALLED & NEEDS A REFILL ON  DEXILANT 60 MG capsule  Called into [facility] [address] in [place]. She has an Vurtual appt scheduled on 05-26-2019 with Dr Servando Snare.

## 2019-05-04 ENCOUNTER — Other Ambulatory Visit: Payer: Self-pay

## 2019-05-04 MED ORDER — DEXLANSOPRAZOLE 60 MG PO CPDR: 1.0000 | DELAYED_RELEASE_CAPSULE | Freq: Every day | ORAL | 1 refills | Status: DC

## 2019-05-04 NOTE — Telephone Encounter (Signed)
Prescription for Dexilant has been sent to [facility] on [address] per pt request.

## 2019-05-11 DIAGNOSIS — F330 Major depressive disorder, recurrent, mild: Secondary | ICD-10-CM | POA: Diagnosis not present

## 2019-05-16 ENCOUNTER — Ambulatory Visit (INDEPENDENT_AMBULATORY_CARE_PROVIDER_SITE_OTHER): Payer: BLUE CROSS/BLUE SHIELD | Admitting: Gastroenterology

## 2019-05-16 ENCOUNTER — Other Ambulatory Visit: Payer: Self-pay

## 2019-05-16 ENCOUNTER — Encounter: Payer: Self-pay | Admitting: Gastroenterology

## 2019-05-16 DIAGNOSIS — K219 Gastro-esophageal reflux disease without esophagitis: Secondary | ICD-10-CM | POA: Diagnosis not present

## 2019-05-16 NOTE — Progress Notes (Signed)
Deborah Miniumarren Kelsey Edman, MD [address]  [address]  [place], [place] [address]  Main: [phone]  Fax: [phone]    Gastroenterology Virtual/Video Visit  Referring Provider:     Steele Sizerrissman, Mark A, MD Primary Care Physician:  Steele Sizerrissman, Mark A, MD Primary Gastroenterologist:  Dr.Crystallee Werden Servando SnareWohl Reason for Consultation:     GERD and in need of a refill        HPI:    Virtual Visit via Video Note Location of the patient: Home Location of provider: Office  Participating persons: The patient myself and Ginger Feldpausch.  I connected with Thomasena EdisRebecca H Such on 05/16/19 at 10:30 AM EDT by a video enabled telemedicine application and verified that I am speaking with the correct person using two identifiers.   I discussed the limitations of evaluation and management by telemedicine and the availability of in person appointments. The patient expressed understanding and agreed to proceed.  Verbal consent to proceed obtained.  History of Present Illness: Deborah Fields is a 33 y.o. female referred by Dr. Steele Sizerrissman, Mark A, MD  for consultation & management of GERD.  This patient is seen me in the past for heartburn and had reported that her heartburn was quite significant while she was pregnant.  The patient had been well controlled with Dexilant and was in need of a refill.  There is no report of any dysphasia nausea vomiting fevers chills black stools or bloody stools.  The Dexilant has been working for her and she was given a limited supply while she awaited this video visit.  The patient denies any unexplained weight loss.  She is concerned because of the COVID virus and losing her job.   Past Medical History:  Diagnosis Date  . Allergic rhinitis   . Anemia    during pregnancy  . Anxiety    managed by Psych  . Asthma   . Depression    managed by psych  . Erosive esophagitis    seen on Colonoscopy  . GERD (gastroesophageal reflux disease)   . Headache   . Heartburn   . Hiatal hernia     shown on colonoscopy  . IBS (irritable bowel syndrome)    per colonoscopy in Feb 2015  . MRSA (methicillin resistant Staphylococcus aureus)   . Panic disorder without agoraphobia    managed by Psych  . Pneumonia 2016  . PONV (postoperative nausea and vomiting)     Past Surgical History:  Procedure Laterality Date  . CESAREAN SECTION    . CESAREAN SECTION WITH BILATERAL TUBAL LIGATION Bilateral 10/30/2017   Procedure: CESAREAN SECTION WITH BILATERAL TUBAL LIGATION;  Surgeon: Hildred Laserherry, Anika, MD;  Location: [facility] ORS;  Service: Obstetrics;  Laterality: Bilateral;  . COLONOSCOPY  Feb 2015   showed IBS, Hiatal Hernia, Erosive reflux esophagitis  . ESOPHAGOGASTRODUODENOSCOPY    . OVARIAN CYST REMOVAL Right   . TONSILLECTOMY    . TONSILLECTOMY      Prior to Admission medications   Medication Sig Start Date End Date Taking? Authorizing Provider  albuterol (PROAIR HFA) 108 (90 Base) MCG/ACT inhaler Inhale 2 puffs into the lungs every 6 (six) hours as needed. Reported on 03/17/2016 10/20/16   Particia NearingLane, Rachel Elizabeth, PA-C  clonazePAM (KLONOPIN) 0.5 MG tablet Take 0.5 mg by mouth 2 (two) times daily as needed. 11/02/17   [provider]  dexlansoprazole (DEXILANT) 60 MG capsule Take 1 capsule (60 mg total) by mouth daily. 05/04/19   Deborah MiniumWohl, Jayni Prescher, MD  DULoxetine (CYMBALTA) 60  MG capsule Take 60 mg by mouth daily.    [provider]  fluticasone (FLONASE) 50 MCG/ACT nasal spray USE 2 SPRAYS IN BOTH NOSTRILS ONCE A DAY 04/21/19   Steele Sizer, MD  levonorgestrel-ethinyl estradiol (SEASONALE,INTROVALE,JOLESSA) 0.15-0.03 MG tablet Take 1 tablet by mouth daily. 08/19/18   Hildred Laser, MD  Probiotic Product (SOLUBLE FIBER/PROBIOTICS PO) Take 1 capsule at bedtime by mouth.     [provider]  sertraline (ZOLOFT) 100 MG tablet Take 200 mg by mouth daily.  06/09/15   [provider]    Family History  Problem Relation Age of Onset  . Diabetes Mother   . Hypertension  Mother   . Hyperlipidemia Mother   . Heart disease Father   . Hypertension Father   . Hyperlipidemia Father   . COPD Maternal Grandmother   . COPD Maternal Grandfather   . Cancer Paternal Grandfather        prostate  . Stroke Neg Hx   . Kidney disease Neg Hx   . Bladder Cancer Neg Hx      Social History   Tobacco Use  . Smoking status: Former Smoker    Packs/day: 0.50    Years: 5.00    Pack years: 2.50    Types: Cigarettes  . Smokeless tobacco: Never Used  . Tobacco comment: quit 10 years  Substance Use Topics  . Alcohol use: No    Alcohol/week: 0.0 standard drinks  . Drug use: No    Allergies as of 05/16/2019 - Review Complete 10/19/2018  Allergen Reaction Noted  . Sulfa antibiotics Rash 09/12/2015    Review of Systems:    All systems reviewed and negative except where noted in HPI.   Observations/Objective:  Labs: CBC    Component Value Date/Time   WBC 5.2 05/27/2018 1001   WBC 9.4 10/31/2017 0632   RBC 4.47 05/27/2018 1001   RBC 3.55 (L) 10/31/2017 0632   HGB 12.8 05/27/2018 1001   HCT 37.3 05/27/2018 1001   PLT 254 05/27/2018 1001   MCV 83 05/27/2018 1001   MCV 89 08/16/2013 0953   MCH 28.6 05/27/2018 1001   MCH 30.7 10/31/2017 0632   MCHC 34.3 05/27/2018 1001   MCHC 33.9 10/31/2017 0632   RDW 13.1 05/27/2018 1001   RDW 11.6 08/16/2013 0953   LYMPHSABS 1.6 08/31/2017 1149   EOSABS 0.1 08/31/2017 1149   BASOSABS 0.0 08/31/2017 1149   CMP     Component Value Date/Time   NA 140 05/27/2018 1001   NA 143 12/19/2012 2016   K 4.3 05/27/2018 1001   K 3.6 12/19/2012 2016   CL 108 (H) 05/27/2018 1001   CL 108 (H) 12/19/2012 2016   CO2 20 05/27/2018 1001   CO2 24 12/19/2012 2016   GLUCOSE 92 05/27/2018 1001   GLUCOSE 85 10/31/2017 0643   GLUCOSE 103 (H) 12/19/2012 2016   BUN 17 05/27/2018 1001   BUN 13 12/19/2012 2016   CREATININE 0.80 05/27/2018 1001   CREATININE 0.57 (L) 12/19/2012 2016   CALCIUM 9.0 05/27/2018 1001   CALCIUM 8.6  12/19/2012 2016   PROT 6.9 05/27/2018 1001   PROT 7.3 12/19/2012 2016   ALBUMIN 4.5 05/27/2018 1001   ALBUMIN 4.6 12/19/2012 2016   AST 31 05/27/2018 1001   AST 21 12/19/2012 2016   ALT 30 05/27/2018 1001   ALT 26 12/19/2012 2016   ALKPHOS 81 05/27/2018 1001   ALKPHOS 81 12/19/2012 2016   BILITOT 0.2 05/27/2018 1001  BILITOT 0.2 12/19/2012 2016   GFRNONAA 96 05/27/2018 1001   GFRNONAA >60 12/19/2012 2016   GFRAA 111 05/27/2018 1001   GFRAA >60 12/19/2012 2016    Imaging Studies: No results found.  Assessment and Plan:   MARGO BROOKHOUSE is a 32 y.o. y/o female who is here for follow-up and a refill of her medication.  The patient is on Dexilant and states that the Dexilant has been working well for her with very little acid breakthrough and usually due to something she eats such as too much soda.  The patient will be given a prescription for the Dexilant at 60 mg and enough refills to last her a year.  The patient will contact us when she runs out of the medication for more refills.  The patient has been explained the plan and agrees with it.  Follow Up Instructions:  I discussed the assessment and treatment plan with the patient. The patient was provided an opportunity to ask questions and all were answered. The patient agreed with the plan and demonstrated an understanding of the instructions.   The patient was advised to call back or seek an in-person evaluation if the symptoms worsen or if the condition fails to improve as anticipated.  I provided 10 minutes of non-face-to-face time during this encounter.   Deborah Minium, MD  Speech recognition software was used to dictate the above note.

## 2019-05-18 ENCOUNTER — Telehealth: Payer: Self-pay

## 2019-05-18 NOTE — Telephone Encounter (Signed)
Pt wants to keep her televisit due to having 2 small children at home and do not feel comfortable bringing them out.

## 2019-05-19 ENCOUNTER — Telehealth: Payer: Self-pay

## 2019-05-19 ENCOUNTER — Encounter: Payer: BLUE CROSS/BLUE SHIELD | Admitting: Obstetrics and Gynecology

## 2019-05-19 NOTE — Telephone Encounter (Signed)
Pt called to do her televisit with [facility] no answer LM to call the office for her visit.

## 2019-05-19 NOTE — Progress Notes (Deleted)
Televisit- Pt has a telephone visit today for birth control. Pt stated

## 2019-06-01 ENCOUNTER — Telehealth: Payer: Self-pay

## 2019-06-01 NOTE — Telephone Encounter (Signed)
Pt prescreened no symptoms has face mask.   Coronavirus (COVID-19) Are you at risk?  Are you at risk for the Coronavirus (COVID-19)?  To be considered HIGH RISK for Coronavirus (COVID-19), you have to meet the following criteria:  . Traveled to [place], [place], [place], [place] or [place]; or in the [place] to [place], [place], [place], or [place]; and have fever, cough, and shortness of breath within the last 2 weeks of travel OR . Been in close contact with a person diagnosed with COVID-19 within the last 2 weeks and have fever, cough, and shortness of breath . IF YOU DO NOT MEET THESE CRITERIA, YOU ARE CONSIDERED LOW RISK FOR COVID-19.  What to do if you are HIGH RISK for COVID-19?  . If you are having a medical emergency, call 911. . Seek medical care right away. Before you go to a doctor's office, urgent care or emergency department, call ahead and tell them about your recent travel, contact with someone diagnosed with COVID-19, and your symptoms. You should receive instructions from your physician's office regarding next steps of care.  . When you arrive at healthcare provider, tell the healthcare staff immediately you have returned from visiting [place], [place], [place], [place] or [place]; or traveled in the [place] to [place], [place], [place], or [place]; in the last two weeks or you have been in close contact with a person diagnosed with COVID-19 in the last 2 weeks.   . Tell the health care staff about your symptoms: fever, cough and shortness of breath. . After you have been seen by a medical provider, you will be either: o Tested for (COVID-19) and discharged home on quarantine except to seek medical care if symptoms worsen, and asked to  - Stay home and avoid contact with others until you get your results (4-5 days)  - Avoid travel on public transportation if possible (such as bus, train, or airplane) or o Sent to the Emergency Department by EMS for  evaluation, COVID-19 testing, and possible admission depending on your condition and test results.  What to do if you are LOW RISK for COVID-19?  Reduce your risk of any infection by using the same precautions used for avoiding the common cold or flu:  . Wash your hands often with soap and warm water for at least 20 seconds.  If soap and water are not readily available, use an alcohol-based hand sanitizer with at least 60% alcohol.  . If coughing or sneezing, cover your mouth and nose by coughing or sneezing into the elbow areas of your shirt or coat, into a tissue or into your sleeve (not your hands). . Avoid shaking hands with others and consider head nods or verbal greetings only. . Avoid touching your eyes, nose, or mouth with unwashed hands.  . Avoid close contact with people who are sick. . Avoid places or events with large numbers of people in one location, like concerts or sporting events. . Carefully consider travel plans you have or are making. . If you are planning any travel outside or inside the [place], visit the CDC's Travelers' Health webpage for the latest health notices. . If you have some symptoms but not all symptoms, continue to monitor at home and seek medical attention if your symptoms worsen. . If you are having a medical emergency, call 911.   ADDITIONAL HEALTHCARE OPTIONS FOR PATIENTS  [facility] / e-Visit: [email or web address]           [facility]: [phone]  [facility]: [phone]                   [facility]: [phone]  

## 2019-06-02 ENCOUNTER — Encounter: Payer: Self-pay | Admitting: Obstetrics and Gynecology

## 2019-06-02 ENCOUNTER — Other Ambulatory Visit (HOSPITAL_COMMUNITY)
Admission: RE | Admit: 2019-06-02 | Discharge: 2019-06-02 | Disposition: A | Discharge status: Home / Self Care | Payer: BC Managed Care – PPO | Source: Ambulatory Visit | Attending: Obstetrics and Gynecology | Admitting: Obstetrics and Gynecology

## 2019-06-02 ENCOUNTER — Ambulatory Visit (INDEPENDENT_AMBULATORY_CARE_PROVIDER_SITE_OTHER): Payer: BC Managed Care – PPO | Admitting: Obstetrics and Gynecology

## 2019-06-02 ENCOUNTER — Other Ambulatory Visit: Payer: Self-pay

## 2019-06-02 VITALS — BP 110/62 | HR 80 | Ht 67.0 in | Wt 205.0 lb

## 2019-06-02 DIAGNOSIS — F419 Anxiety disorder, unspecified: Secondary | ICD-10-CM

## 2019-06-02 DIAGNOSIS — Z124 Encounter for screening for malignant neoplasm of cervix: Secondary | ICD-10-CM | POA: Insufficient documentation

## 2019-06-02 DIAGNOSIS — E785 Hyperlipidemia, unspecified: Secondary | ICD-10-CM

## 2019-06-02 DIAGNOSIS — Z01419 Encounter for gynecological examination (general) (routine) without abnormal findings: Secondary | ICD-10-CM | POA: Diagnosis not present

## 2019-06-02 DIAGNOSIS — F32A Depression, unspecified: Secondary | ICD-10-CM

## 2019-06-02 DIAGNOSIS — E669 Obesity, unspecified: Secondary | ICD-10-CM

## 2019-06-02 DIAGNOSIS — N943 Premenstrual tension syndrome: Secondary | ICD-10-CM

## 2019-06-02 DIAGNOSIS — N920 Excessive and frequent menstruation with regular cycle: Secondary | ICD-10-CM

## 2019-06-02 DIAGNOSIS — F329 Major depressive disorder, single episode, unspecified: Secondary | ICD-10-CM

## 2019-06-02 NOTE — Progress Notes (Signed)
GYNECOLOGY ANNUAL PHYSICAL EXAM PROGRESS NOTE  Subjective:    Deborah Fields is a 33 y.o. 472P2002 female who presents for an annual exam. The patient is sexually active. The patient wears seatbelts: yes. The patient participates in regular exercise: yes. Has the patient ever been transfused or tattooed?: no. The patient reports that there is not domestic violence in her life.    The patient has the following complaints today:  1. None  Gynecologic History  Menarche age: [age] Patient's last menstrual period was 05/27/2019.  Cycles doing better on Seasonale.  Still has some mood swings, but not as severe.  Contraception: tubal ligation History of STI's: Denies Last Pap: Approximately 07/2016. Results were: normal.  Denies h/o abnormal pap smears.   OB History  Gravida Para Term Preterm AB Living  2 2 2  0 0 2  SAB TAB Ectopic Multiple Live Births  0 0 0 0 2    # Outcome Date GA Lbr Len/2nd Weight Sex Delivery Anes PTL Lv  2 Term 10/30/17 8650w0d  8 lb 9.6 oz (3.9 kg) F CS-LTranv Spinal  LIV     Name: Mcquinn,GIRL Analucia     Apgar1: 7  Apgar5: 9  1 Term 2012 5139w0d  8 lb 7 oz (3.827 kg) F CS-Unspec Spinal  LIV    Obstetric Comments  G1- Fetal tachycardia, arrest of descent in 2nd stage (pushed for 3 hrs).     Past Medical History:  Diagnosis Date  . Allergic rhinitis   . Anemia    during pregnancy  . Anxiety    managed by Psych  . Asthma   . Depression    managed by psych  . Erosive esophagitis    seen on Colonoscopy  . GERD (gastroesophageal reflux disease)   . Headache   . Heartburn   . Hiatal hernia    shown on colonoscopy  . IBS (irritable bowel syndrome)    per colonoscopy in Feb 2015  . MRSA (methicillin resistant Staphylococcus aureus)   . Panic disorder without agoraphobia    managed by Psych  . Pneumonia 2016  . PONV (postoperative nausea and vomiting)     Past Surgical History:  Procedure Laterality Date  . CESAREAN SECTION    . CESAREAN SECTION  WITH BILATERAL TUBAL LIGATION Bilateral 10/30/2017   Procedure: CESAREAN SECTION WITH BILATERAL TUBAL LIGATION;  Surgeon: Hildred Laserherry, Cleto Claggett, MD;  Location: [facility] ORS;  Service: Obstetrics;  Laterality: Bilateral;  . COLONOSCOPY  Feb 2015   showed IBS, Hiatal Hernia, Erosive reflux esophagitis  . ESOPHAGOGASTRODUODENOSCOPY    . OVARIAN CYST REMOVAL Right   . TONSILLECTOMY    . TONSILLECTOMY      Family History  Problem Relation Age of Onset  . Diabetes Mother   . Hypertension Mother   . Hyperlipidemia Mother   . COPD Mother   . Heart disease Father   . Hypertension Father   . Hyperlipidemia Father   . COPD Maternal Grandmother   . COPD Maternal Grandfather   . Cancer Paternal Grandfather        prostate  . Stroke Neg Hx   . Kidney disease Neg Hx   . Bladder Cancer Neg Hx     Social History   Socioeconomic History  . Marital status: Married    Spouse name: Not on file  . Number of children: Not on file  . Years of education: Not on file  . Highest education level: Not on file  Occupational History  .  Not on file  Social Needs  . Financial resource strain: Not on file  . Food insecurity    Worry: Not on file    Inability: Not on file  . Transportation needs    Medical: Not on file    Non-medical: Not on file  Tobacco Use  . Smoking status: Former Smoker    Packs/day: 0.50    Years: 5.00    Pack years: 2.50    Types: Cigarettes  . Smokeless tobacco: Never Used  . Tobacco comment: quit 10 years  Substance and Sexual Activity  . Alcohol use: No    Alcohol/week: 0.0 standard drinks  . Drug use: No  . Sexual activity: Yes    Birth control/protection: Surgical, Pill  Lifestyle  . Physical activity    Days per week: Not on file    Minutes per session: Not on file  . Stress: Not on file  Relationships  . Social Herbalist on phone: Not on file    Gets together: Not on file    Attends religious service: Not on file    Active member of club or  organization: Not on file    Attends meetings of clubs or organizations: Not on file    Relationship status: Not on file  . Intimate partner violence    Fear of current or ex partner: Not on file    Emotionally abused: Not on file    Physically abused: Not on file    Forced sexual activity: Not on file  Other Topics Concern  . Not on file  Social History Narrative  . Not on file    Current Outpatient Medications on File Prior to Visit  Medication Sig Dispense Refill  . albuterol (PROAIR HFA) 108 (90 Base) MCG/ACT inhaler Inhale 2 puffs into the lungs every 6 (six) hours as needed. Reported on 03/17/2016 1 Inhaler 2  . clonazePAM (KLONOPIN) 0.5 MG tablet Take 0.5 mg by mouth 2 (two) times daily as needed.  1  . dexlansoprazole (DEXILANT) 60 MG capsule Take 1 capsule (60 mg total) by mouth daily. 90 capsule 1  . DULoxetine (CYMBALTA) 60 MG capsule Take 60 mg by mouth daily.    . fluticasone (FLONASE) 50 MCG/ACT nasal spray USE 2 SPRAYS IN BOTH NOSTRILS ONCE A DAY 16 g 12  . levonorgestrel-ethinyl estradiol (SEASONALE,INTROVALE,JOLESSA) 0.15-0.03 MG tablet Take 1 tablet by mouth daily. 1 Package 4  . Probiotic Product (SOLUBLE FIBER/PROBIOTICS PO) Take 1 capsule at bedtime by mouth.     . sertraline (ZOLOFT) 100 MG tablet Take 200 mg by mouth daily.   3   No current facility-administered medications on file prior to visit.     Allergies  Allergen Reactions  . Sulfa Antibiotics Rash     Review of Systems Constitutional: negative for chills, fatigue, fevers and sweats Eyes: negative for irritation, redness and visual disturbance Ears, nose, mouth, throat, and face: negative for hearing loss, nasal congestion, snoring and tinnitus Respiratory: negative for asthma, cough, sputum Cardiovascular: negative for chest pain, dyspnea, exertional chest pressure/discomfort, irregular heart beat, palpitations and syncope Gastrointestinal: negative for abdominal pain, change in bowel habits,  nausea and vomiting Genitourinary: negative for abnormal menstrual periods, genital lesions, sexual problems and vaginal discharge, dysuria and urinary incontinence Integument/breast: negative for breast lump, breast tenderness and nipple discharge Hematologic/lymphatic: negative for bleeding and easy bruising Musculoskeletal:negative for back pain and muscle weakness Neurological: negative for dizziness, headaches, vertigo and weakness Endocrine: negative for diabetic symptoms  including polydipsia, polyuria and skin dryness Allergic/Immunologic: negative for hay fever and urticaria    Psychological: positive for - anxiety, depression (well controlled on medications) and mood swings related to menses (has improved with use of OCPs). Negative for - concentration difficulties, obsessive thoughts or suicidal ideation    Objective:  Blood pressure 110/62, pulse 80, height 5\' 7"  (1.702 m), weight 205 lb (93 kg), last menstrual period 05/27/2019, unknown if currently breastfeeding. Body mass index is 32.11 kg/m.   General Appearance:    Alert, cooperative, no distress, appears stated age, mild obesity  Head:    Normocephalic, without obvious abnormality, atraumatic  Eyes:    PERRL, conjunctiva/corneas clear, EOM's intact, both eyes  Ears:    Normal external ear canals, both ears  Nose:   Nares normal, septum midline, mucosa normal, no drainage or sinus tenderness  Throat:   Lips, mucosa, and tongue normal; teeth and gums normal  Neck:   Supple, symmetrical, trachea midline, no adenopathy; thyroid: no enlargement/tenderness/nodules; no carotid bruit or JVD  Back:     Symmetric, no curvature, ROM normal, no CVA tenderness  Lungs:     Clear to auscultation bilaterally, respirations unlabored  Chest Wall:    No tenderness or deformity   Heart:    Regular rate and rhythm, S1 and S2 normal, no murmur, rub or gallop  Breast Exam:    No tenderness, masses, or nipple abnormality  Abdomen:     Soft,  non-tender, bowel sounds active all four quadrants, no masses, no organomegaly.    Genitalia:    Pelvic:external genitalia normal, vagina without lesions, discharge, or tenderness, rectovaginal septum  normal. Cervix normal in appearance, no cervical motion tenderness, no adnexal masses or tenderness.  Uterus normal size, shape, mobile, regular contours, nontender.  Rectal:    Normal external sphincter.  No hemorrhoids appreciated. Internal exam not done.   Extremities:   Extremities normal, atraumatic, no cyanosis or edema  Pulses:   2+ and symmetric all extremities  Skin:   Skin color, texture, turgor normal, no rashes or lesions  Lymph nodes:   Cervical, supraclavicular, and axillary nodes normal  Neurologic:   CNII-XII intact, normal strength, sensation and reflexes throughout   .  Labs:  Lab Results  Component Value Date   WBC 5.2 05/27/2018   HGB 12.8 05/27/2018   HCT 37.3 05/27/2018   MCV 83 05/27/2018   PLT 254 05/27/2018    Lab Results  Component Value Date   CREATININE 0.80 05/27/2018   BUN 17 05/27/2018   NA 140 05/27/2018   K 4.3 05/27/2018   CL 108 (H) 05/27/2018   CO2 20 05/27/2018    Lab Results  Component Value Date   ALT 30 05/27/2018   AST 31 05/27/2018   ALKPHOS 81 05/27/2018   BILITOT 0.2 05/27/2018    Lab Results  Component Value Date   TSH 1.100 01/13/2018     Assessment:    Healthy female exam.  Anxiety and depression PMS symptoms Heavy menstrual cycles Mild obesity Dyslipidemia  Plan:    - Blood tests: CBC with diff, Comprehensive metabolic panel, A1c and Lipoproteins. - Breast self exam technique reviewed and patient encouraged to perform self-exam monthly. - Contraception: tubal ligation. - Discussed healthy lifestyle modifications.  Patient inquires into weight loss management. Notes it has been difficult to exercise at home being with her children constantly. Discussed other avenues of weight loss management. She states she would  like to avoid anything that contains a  stimulant. Can discuss at next visit.  - Dyslipidemia noted on last year's labs. Will repeat today. On further discussion, patient notes a strong family history of dyslipidemia and cardiac disease (especially in father, who is very physically active). Discussed that some of this can be controlled by diet and healthy lifestyle, but genetics may also play a role.  If lipis are more elevated this year, will need to consider lipid therapy.  - Pap smear performed today .   - Currently on Seasonale for management of her PMS symptoms and heavy menses.  - Anxiety and depression managed by her Psychiatrist. Doing well.  - Continue use of Seasonale for management of PMS symptoms and menorrhagia.  - Follow up in 1 year.    Hildred Laserherry, Charon Akamine, MD [facility]

## 2019-06-02 NOTE — Progress Notes (Signed)
PT is present today for her annual exam. Pt stated that she has been doing self-breast exams monthly. Pt stated that she is doing well and denies any issues. No problems or concerns.     

## 2019-06-02 NOTE — Patient Instructions (Addendum)
Preventive Care 18-39 Years, Female °Preventive care refers to lifestyle choices and visits with your health care provider that can promote health and wellness. °What does preventive care include? ° °· A yearly physical exam. This is also called an annual well check. °· Dental exams once or twice a year. °· Routine eye exams. Ask your health care provider how often you should have your eyes checked. °· Personal lifestyle choices, including: °? Daily care of your teeth and gums. °? Regular physical activity. °? Eating a healthy diet. °? Avoiding tobacco and drug use. °? Limiting alcohol use. °? Practicing safe sex. °? Taking vitamin and mineral supplements as recommended by your health care provider. °What happens during an annual well check? °The services and screenings done by your health care provider during your annual well check will depend on your age, overall health, lifestyle risk factors, and family history of disease. °Counseling °Your health care provider may ask you questions about your: °· Alcohol use. °· Tobacco use. °· Drug use. °· Emotional well-being. °· Home and relationship well-being. °· Sexual activity. °· Eating habits. °· Work and work environment. °· Method of birth control. °· Menstrual cycle. °· Pregnancy history. °Screening °You may have the following tests or measurements: °· Height, weight, and BMI. °· Diabetes screening. This is done by checking your blood sugar (glucose) after you have not eaten for a while (fasting). °· Blood pressure. °· Lipid and cholesterol levels. These may be checked every 5 years starting at age 20. °· Skin check. °· Hepatitis C blood test. °· Hepatitis B blood test. °· Sexually transmitted disease (STD) testing. °· BRCA-related cancer screening. This may be done if you have a family history of breast, ovarian, tubal, or peritoneal cancers. °· Pelvic exam and Pap test. This may be done every 3 years starting at age 21. Starting at age 30, this may be done every 5  years if you have a Pap test in combination with an HPV test. °Discuss your test results, treatment options, and if necessary, the need for more tests with your health care provider. °Vaccines °Your health care provider may recommend certain vaccines, such as: °· Influenza vaccine. This is recommended every year. °· Tetanus, diphtheria, and acellular pertussis (Tdap, Td) vaccine. You may need a Td booster every 10 years. °· Varicella vaccine. You may need this if you have not been vaccinated. °· HPV vaccine. If you are 26 or younger, you may need three doses over 6 months. °· Measles, mumps, and rubella (MMR) vaccine. You may need at least one dose of MMR. You may also need a second dose. °· Pneumococcal 13-valent conjugate (PCV13) vaccine. You may need this if you have certain conditions and were not previously vaccinated. °· Pneumococcal polysaccharide (PPSV23) vaccine. You may need one or two doses if you smoke cigarettes or if you have certain conditions. °· Meningococcal vaccine. One dose is recommended if you are age 19-21 years and a first-year college student living in a residence hall, or if you have one of several medical conditions. You may also need additional booster doses. °· Hepatitis A vaccine. You may need this if you have certain conditions or if you travel or work in places where you may be exposed to hepatitis A. °· Hepatitis B vaccine. You may need this if you have certain conditions or if you travel or work in places where you may be exposed to hepatitis B. °· Haemophilus influenzae type b (Hib) vaccine. You may need this if you   have certain risk factors. °Talk to your health care provider about which screenings and vaccines you need and how often you need them. °This information is not intended to replace advice given to you by your health care provider. Make sure you discuss any questions you have with your health care provider. °Document Released: 01/27/2002 Document Revised: 07/14/2017  Document Reviewed: 10/02/2015 °Elsevier Interactive Patient Education © 2019 [employer] °Breast Self-Awareness °Breast self-awareness means: °· Knowing how your breasts look. °· Knowing how your breasts feel. °· Checking your breasts every month for changes. °· Telling your doctor if you notice a change in your breasts. °Breast self-awareness allows you to notice a breast problem early while it is still small. °How to do a breast self-exam °One way to learn what is normal for your breasts and to check for changes is to do a breast self-exam. To do a breast self-exam: °Look for Changes ° °1. Take off all the clothes above your waist. °2. Stand in front of a mirror in a room with good lighting. °3. Put your hands on your hips. °4. Push your hands down. °5. Look at your breasts and nipples in the mirror to see if one breast or nipple looks different than the other. Check to see if: °? The shape of one breast is different. °? The size of one breast is different. °? There are wrinkles, dips, and bumps in one breast and not the other. °6. Look at each breast for changes in your skin, such as: °? Redness. °? Scaly areas. °7. Look for changes in your nipples, such as: °? Liquid around the nipples. °? Bleeding. °? Dimpling. °? Redness. °? A change in where the nipples are. °Feel for Changes °1. Lie on your back on the floor. °2. Feel each breast. To do this, follow these steps: °? Pick a breast to feel. °? Put the arm closest to that breast above your head. °? Use your other arm to feel the nipple area of your breast. Feel the area with the pads of your three middle fingers by making small circles with your fingers. For the first circle, press lightly. For the second circle, press harder. For the third circle, press even harder. °? Keep making circles with your fingers at the light, harder, and even harder pressures as you move down your breast. Stop when you feel your ribs. °? Move your fingers a little toward the center  of your body. °? Start making circles with your fingers again, this time going up until you reach your collarbone. °? Keep making up and down circles until you reach your armpit. Remember to keep using the three pressures. °? Feel the other breast in the same way. °3. Sit or stand in the shower or tub. °4. With soapy water on your skin, feel each breast the same way you did in step 2, when you were lying on the floor. °Write Down What You Find °After doing the self-exam, write down: °· What is normal for each breast. °· Any changes you find in each breast. °· When you last had your period. ° °How often should I check my breasts? °Check your breasts every month. If you are breastfeeding, the best time to check them is after you feed your baby or after you use a breast pump. If you get periods, the best time to check your breasts is 5-7 days after your period is over. °When should I see my doctor? °See your doctor if you notice: °·   A change in shape or size of your breasts or nipples. °· A change in the skin of your breast or nipples, such as red or scaly skin. °· Unusual fluid coming from your nipples. °· A lump or thick area that was not there before. °· Pain in your breasts. °· Anything that concerns you. °This information is not intended to replace advice given to you by your health care provider. Make sure you discuss any questions you have with your health care provider. °Document Released: 05/19/2008 Document Revised: 05/08/2016 Document Reviewed: 10/21/2015 °Elsevier Interactive Patient Education © 2019 [employer] Inc[employer] ° °

## 2019-06-03 LAB — CBC
Hematocrit: 39.2 % (ref 34.0–46.6)
Hemoglobin: 13.7 g/dL (ref 11.1–15.9)
MCH: 28.4 pg (ref 26.6–33.0)
MCHC: 34.9 g/dL (ref 31.5–35.7)
MCV: 81 fL (ref 79–97)
Platelets: 300 10*3/uL (ref 150–450)
RBC: 4.82 x10E6/uL (ref 3.77–5.28)
RDW: 13.7 % (ref 11.7–15.4)
WBC: 6.6 10*3/uL (ref 3.4–10.8)

## 2019-06-03 LAB — COMPREHENSIVE METABOLIC PANEL
ALT: 26 IU/L (ref 0–32)
AST: 25 IU/L (ref 0–40)
Albumin/Globulin Ratio: 1.8 (ref 1.2–2.2)
Albumin: 4.7 g/dL (ref 3.8–4.8)
Alkaline Phosphatase: 94 IU/L (ref 39–117)
BUN/Creatinine Ratio: 18 (ref 9–23)
BUN: 14 mg/dL (ref 6–20)
Bilirubin Total: <0.2 mg/dL (ref 0.0–1.2)
CO2: 21 mmol/L (ref 20–29)
Calcium: 9.5 mg/dL (ref 8.7–10.2)
Chloride: 102 mmol/L (ref 96–106)
Creatinine, Ser: 0.8 mg/dL (ref 0.57–1.00)
GFR calc Af Amer: 110 mL/min/{1.73_m2} (ref >59)
GFR calc non Af Amer: 96 mL/min/{1.73_m2} (ref >59)
Globulin, Total: 2.6 g/dL (ref 1.5–4.5)
Glucose: 69 mg/dL (ref 65–99)
Potassium: 4 mmol/L (ref 3.5–5.2)
Sodium: 137 mmol/L (ref 134–144)
Total Protein: 7.3 g/dL (ref 6.0–8.5)

## 2019-06-03 LAB — LIPID PANEL
Chol/HDL Ratio: 9.6 ratio — ABNORMAL HIGH (ref 0.0–4.4)
Cholesterol, Total: 277 mg/dL — ABNORMAL HIGH (ref 100–199)
HDL: 29 mg/dL — ABNORMAL LOW (ref >39)
Triglycerides: 661 mg/dL (ref 0–149)

## 2019-06-03 LAB — HEMOGLOBIN A1C
Est. average glucose Bld gHb Est-mCnc: 103 mg/dL
Hgb A1c MFr Bld: 5.2 % (ref 4.8–5.6)

## 2019-06-06 ENCOUNTER — Other Ambulatory Visit: Payer: Self-pay | Admitting: Obstetrics and Gynecology

## 2019-06-06 MED ORDER — EZETIMIBE-SIMVASTATIN 10-20 MG PO TABS: 1.0000 | ORAL_TABLET | Freq: Every day | ORAL | 3 refills | Status: DC

## 2019-06-07 LAB — CYTOLOGY - PAP
Diagnosis: NEGATIVE
HPV: NOT DETECTED

## 2019-06-21 DIAGNOSIS — F330 Major depressive disorder, recurrent, mild: Secondary | ICD-10-CM | POA: Diagnosis not present

## 2019-06-27 MED ORDER — PHENTERMINE-TOPIRAMATE ER 3.75-23 MG PO CP24: 1.0000 | ORAL_CAPSULE | Freq: Every day | ORAL | 0 refills | Status: DC

## 2019-06-28 DIAGNOSIS — F411 Generalized anxiety disorder: Secondary | ICD-10-CM | POA: Diagnosis not present

## 2019-06-28 DIAGNOSIS — F332 Major depressive disorder, recurrent severe without psychotic features: Secondary | ICD-10-CM | POA: Diagnosis not present

## 2019-06-28 DIAGNOSIS — F410 Panic disorder [episodic paroxysmal anxiety] without agoraphobia: Secondary | ICD-10-CM | POA: Diagnosis not present

## 2019-07-05 DIAGNOSIS — F330 Major depressive disorder, recurrent, mild: Secondary | ICD-10-CM | POA: Diagnosis not present

## 2019-08-26 ENCOUNTER — Other Ambulatory Visit: Payer: Self-pay

## 2019-08-30 DIAGNOSIS — F330 Major depressive disorder, recurrent, mild: Secondary | ICD-10-CM | POA: Diagnosis not present

## 2019-09-08 DIAGNOSIS — M546 Pain in thoracic spine: Secondary | ICD-10-CM | POA: Diagnosis not present

## 2019-09-08 DIAGNOSIS — M542 Cervicalgia: Secondary | ICD-10-CM | POA: Diagnosis not present

## 2019-09-08 DIAGNOSIS — M5414 Radiculopathy, thoracic region: Secondary | ICD-10-CM | POA: Diagnosis not present

## 2019-09-08 DIAGNOSIS — M9902 Segmental and somatic dysfunction of thoracic region: Secondary | ICD-10-CM | POA: Diagnosis not present

## 2019-09-12 DIAGNOSIS — M542 Cervicalgia: Secondary | ICD-10-CM | POA: Diagnosis not present

## 2019-09-12 DIAGNOSIS — M546 Pain in thoracic spine: Secondary | ICD-10-CM | POA: Diagnosis not present

## 2019-09-12 DIAGNOSIS — M5414 Radiculopathy, thoracic region: Secondary | ICD-10-CM | POA: Diagnosis not present

## 2019-09-12 DIAGNOSIS — M9902 Segmental and somatic dysfunction of thoracic region: Secondary | ICD-10-CM | POA: Diagnosis not present

## 2019-09-13 DIAGNOSIS — F330 Major depressive disorder, recurrent, mild: Secondary | ICD-10-CM | POA: Diagnosis not present

## 2019-09-14 DIAGNOSIS — M546 Pain in thoracic spine: Secondary | ICD-10-CM | POA: Diagnosis not present

## 2019-09-14 DIAGNOSIS — M542 Cervicalgia: Secondary | ICD-10-CM | POA: Diagnosis not present

## 2019-09-14 DIAGNOSIS — M5414 Radiculopathy, thoracic region: Secondary | ICD-10-CM | POA: Diagnosis not present

## 2019-09-14 DIAGNOSIS — M9902 Segmental and somatic dysfunction of thoracic region: Secondary | ICD-10-CM | POA: Diagnosis not present

## 2019-09-21 DIAGNOSIS — M542 Cervicalgia: Secondary | ICD-10-CM | POA: Diagnosis not present

## 2019-09-21 DIAGNOSIS — M5414 Radiculopathy, thoracic region: Secondary | ICD-10-CM | POA: Diagnosis not present

## 2019-09-21 DIAGNOSIS — M9902 Segmental and somatic dysfunction of thoracic region: Secondary | ICD-10-CM | POA: Diagnosis not present

## 2019-09-21 DIAGNOSIS — M546 Pain in thoracic spine: Secondary | ICD-10-CM | POA: Diagnosis not present

## 2019-09-26 ENCOUNTER — Telehealth: Payer: Self-pay | Admitting: Obstetrics and Gynecology

## 2019-09-26 DIAGNOSIS — F411 Generalized anxiety disorder: Secondary | ICD-10-CM | POA: Diagnosis not present

## 2019-09-26 DIAGNOSIS — F332 Major depressive disorder, recurrent severe without psychotic features: Secondary | ICD-10-CM | POA: Diagnosis not present

## 2019-09-26 DIAGNOSIS — F410 Panic disorder [episodic paroxysmal anxiety] without agoraphobia: Secondary | ICD-10-CM | POA: Diagnosis not present

## 2019-09-26 NOTE — Telephone Encounter (Signed)
Pt stated she noticed the area Saturday after intercourse with her husband. Pt stated that the area hurts when she walks. Pt was advised to try warm compress, using witch hazel, tea tree oil, neosporin and loose fitting clothes. Pt was advised that if she develops a fever, chills or cold sweats or the area becomes bigger than please call the office to be seen.

## 2019-09-26 NOTE — Telephone Encounter (Signed)
The patient called and stated that she has a pea sized bump in her inner labia/vaginal area that is extremely painful. The patient stated that it is painful to the touch and also painful with movement. The patient is going to soak in a warm Epson salt bath, but is needing to know what she needs to do or if something else can help. Please advise.

## 2019-09-28 NOTE — Telephone Encounter (Signed)
Pt called no answer LM via VM that I was calling follow up to see how she was doing since her last call to the office. Pt was advised to call the office if she needed.

## 2019-10-05 DIAGNOSIS — M9902 Segmental and somatic dysfunction of thoracic region: Secondary | ICD-10-CM | POA: Diagnosis not present

## 2019-10-05 DIAGNOSIS — M5414 Radiculopathy, thoracic region: Secondary | ICD-10-CM | POA: Diagnosis not present

## 2019-10-05 DIAGNOSIS — M546 Pain in thoracic spine: Secondary | ICD-10-CM | POA: Diagnosis not present

## 2019-10-05 DIAGNOSIS — M542 Cervicalgia: Secondary | ICD-10-CM | POA: Diagnosis not present

## 2019-10-11 DIAGNOSIS — D2372 Other benign neoplasm of skin of left lower limb, including hip: Secondary | ICD-10-CM | POA: Diagnosis not present

## 2019-10-11 DIAGNOSIS — R208 Other disturbances of skin sensation: Secondary | ICD-10-CM | POA: Diagnosis not present

## 2019-10-11 DIAGNOSIS — L710 Perioral dermatitis: Secondary | ICD-10-CM | POA: Diagnosis not present

## 2019-10-11 DIAGNOSIS — D224 Melanocytic nevi of scalp and neck: Secondary | ICD-10-CM | POA: Diagnosis not present

## 2019-10-11 DIAGNOSIS — L821 Other seborrheic keratosis: Secondary | ICD-10-CM | POA: Diagnosis not present

## 2019-10-11 DIAGNOSIS — D235 Other benign neoplasm of skin of trunk: Secondary | ICD-10-CM | POA: Diagnosis not present

## 2019-10-19 ENCOUNTER — Telehealth: Payer: Self-pay | Admitting: Obstetrics and Gynecology

## 2019-10-19 DIAGNOSIS — F330 Major depressive disorder, recurrent, mild: Secondary | ICD-10-CM | POA: Diagnosis not present

## 2019-10-19 DIAGNOSIS — F410 Panic disorder [episodic paroxysmal anxiety] without agoraphobia: Secondary | ICD-10-CM | POA: Diagnosis not present

## 2019-10-19 DIAGNOSIS — F411 Generalized anxiety disorder: Secondary | ICD-10-CM | POA: Diagnosis not present

## 2019-10-19 NOTE — Telephone Encounter (Signed)
She can come in tomorrow 

## 2019-10-19 NOTE — Telephone Encounter (Signed)
The patient called and stated that she has a UTI and is wanting to know if she can have something called in or if she is able to come into the office as soon as possible. Pt requesting a call back. Please advise.

## 2019-10-20 ENCOUNTER — Ambulatory Visit (INDEPENDENT_AMBULATORY_CARE_PROVIDER_SITE_OTHER): Payer: BC Managed Care – PPO | Admitting: Obstetrics and Gynecology

## 2019-10-20 ENCOUNTER — Other Ambulatory Visit: Payer: Self-pay

## 2019-10-20 ENCOUNTER — Encounter: Payer: Self-pay | Admitting: Obstetrics and Gynecology

## 2019-10-20 VITALS — BP 100/67 | HR 98 | Ht 67.0 in | Wt 192.9 lb

## 2019-10-20 DIAGNOSIS — R399 Unspecified symptoms and signs involving the genitourinary system: Secondary | ICD-10-CM | POA: Diagnosis not present

## 2019-10-20 LAB — POCT URINALYSIS DIPSTICK
Bilirubin, UA: NEGATIVE
Glucose, UA: NEGATIVE
Ketones, UA: NEGATIVE
Nitrite, UA: NEGATIVE
Protein, UA: NEGATIVE
Spec Grav, UA: <=1.005 — AB (ref 1.010–1.025)
Urobilinogen, UA: 0.2 E.U./dL
pH, UA: 8 (ref 5.0–8.0)

## 2019-10-20 MED ORDER — NITROFURANTOIN MONOHYD MACRO 100 MG PO CAPS: 100.0000 mg | ORAL_CAPSULE | Freq: Two times a day (BID) | ORAL | 1 refills | Status: DC

## 2019-10-20 NOTE — Progress Notes (Signed)
    GYNECOLOGY CLINIC PROGRESS NOTE Subjective:    Deborah Fields is a 33 y.o. G39P2002 female who complains of abnormal smelling urine, burning with urination, foul smelling urine, frequency and suprapubic pressure for 1 day. Patient denies back pain, fever, stomach ache and vaginal discharge.  Patient does have a history of recurrent UTI.  Patient does have a history of pyelonephritis. She was seen by urology back in 2017 for recurrent UTI and had a complete work up with negative CT renal stone study, cytoscopy which revealed no abnormalities. She reports she was on low dose chronic antibiotics for a while for this.   Review of Systems Pertinent items are noted in HPI.    Objective:    BP 100/67   Pulse 98   Ht 5\' 7"  (1.702 m)   Wt 87.5 kg   LMP 09/30/2019   BMI 30.21 kg/m  General: alert, cooperative, appears stated age and no distress  Abdomen: soft, tender in the suprapubic region and in the lower abdomen  Back: + mild right sided CVA tenderness, no left sided tenderness  GU: deferred   Laboratory:  Urinalysis Results for orders placed or performed in visit on 10/20/19  POCT Urinalysis Dipstick  Result Value Ref Range   Color, UA yellow    Clarity, UA cloudy    Glucose, UA Negative Negative   Bilirubin, UA neg    Ketones, UA neg    Spec Grav, UA <=1.005 (A) 1.010 - 1.025   Blood, UA hemo trace    pH, UA 8.0 5.0 - 8.0   Protein, UA Negative Negative   Urobilinogen, UA 0.2 0.2 or 1.0 E.U./dL   Nitrite, UA neg    Leukocytes, UA Moderate (2+) (A) Negative   Appearance yellow;Cloudy    Odor      Assessment:    Acute cystitis    Plan: Plan:   1. Medications: nitrofurantoin given in light of patient's history and current symptoms.  2. Maintain adequate hydration 3. Follow up if symptoms not improving, and prn.   4. Will send urine for culture.    Rubie Maid, MD [facility]

## 2019-10-20 NOTE — Patient Instructions (Signed)
Urinary Tract Infection, Adult A urinary tract infection (UTI) is an infection of any part of the urinary tract. The urinary tract includes:  The kidneys.  The ureters.  The bladder.  The urethra. These organs make, store, and get rid of pee (urine) in the body. What are the causes? This is caused by germs (bacteria) in your genital area. These germs grow and cause swelling (inflammation) of your urinary tract. What increases the risk? You are more likely to develop this condition if:  You have a small, thin tube (catheter) to drain pee.  You cannot control when you pee or poop (incontinence).  You are female, and: ? You use these methods to prevent pregnancy: ? A medicine that kills sperm (spermicide). ? A device that blocks sperm (diaphragm). ? You have low levels of a female hormone (estrogen). ? You are pregnant.  You have genes that add to your risk.  You are sexually active.  You take antibiotic medicines.  You have trouble peeing because of: ? A prostate that is bigger than normal, if you are female. ? A blockage in the part of your body that drains pee from the bladder (urethra). ? A kidney stone. ? A nerve condition that affects your bladder (neurogenic bladder). ? Not getting enough to drink. ? Not peeing often enough.  You have other conditions, such as: ? Diabetes. ? A weak disease-fighting system (immune system). ? Sickle cell disease. ? Gout. ? Injury of the spine. What are the signs or symptoms? Symptoms of this condition include:  Needing to pee right away (urgently).  Peeing often.  Peeing small amounts often.  Pain or burning when peeing.  Blood in the pee.  Pee that smells bad or not like normal.  Trouble peeing.  Pee that is cloudy.  Fluid coming from the vagina, if you are female.  Pain in the belly or lower back. Other symptoms include:  Throwing up (vomiting).  No urge to eat.  Feeling mixed up (confused).  Being tired  and grouchy (irritable).  A fever.  Watery poop (diarrhea). How is this treated? This condition may be treated with:  Antibiotic medicine.  Other medicines.  Drinking enough water. Follow these instructions at home:  Medicines  Take over-the-counter and prescription medicines only as told by your doctor.  If you were prescribed an antibiotic medicine, take it as told by your doctor. Do not stop taking it even if you start to feel better. General instructions  Make sure you: ? Pee until your bladder is empty. ? Do not hold pee for a long time. ? Empty your bladder after sex. ? Wipe from front to back after pooping if you are a female. Use each tissue one time when you wipe.  Drink enough fluid to keep your pee pale yellow.  Keep all follow-up visits as told by your doctor. This is important. Contact a doctor if:  You do not get better after 1-2 days.  Your symptoms go away and then come back. Get help right away if:  You have very bad back pain.  You have very bad pain in your lower belly.  You have a fever.  You are sick to your stomach (nauseous).  You are throwing up. Summary  A urinary tract infection (UTI) is an infection of any part of the urinary tract.  This condition is caused by germs in your genital area.  There are many risk factors for a UTI. These include having a small, thin   tube to drain pee and not being able to control when you pee or poop.  Treatment includes antibiotic medicines for germs.  Drink enough fluid to keep your pee pale yellow. This information is not intended to replace advice given to you by your health care provider. Make sure you discuss any questions you have with your health care provider. Document Released: 05/19/2008 Document Revised: 11/18/2018 Document Reviewed: 06/10/2018 Elsevier Patient Education  2020 Elsevier Inc.  

## 2019-10-20 NOTE — Telephone Encounter (Signed)
Pt called no answer LM via VM to call the office to schedule an appointment to be seen by AC.  

## 2019-10-20 NOTE — Progress Notes (Signed)
Pt present for UTI symptoms. Pt stated having pain/burning with urination; no back pain and urinate with odor. UA completed and documented.

## 2019-10-25 ENCOUNTER — Other Ambulatory Visit: Payer: Self-pay

## 2019-10-25 ENCOUNTER — Ambulatory Visit: Payer: BC Managed Care – PPO | Admitting: Urology

## 2019-10-25 ENCOUNTER — Encounter: Payer: Self-pay | Admitting: Urology

## 2019-10-25 VITALS — BP 105/69 | HR 85 | Wt 191.0 lb

## 2019-10-25 DIAGNOSIS — N390 Urinary tract infection, site not specified: Secondary | ICD-10-CM

## 2019-10-25 LAB — MICROSCOPIC EXAMINATION: RBC: NONE SEEN /hpf (ref 0–2)

## 2019-10-25 LAB — URINALYSIS, COMPLETE
Bilirubin, UA: NEGATIVE
Glucose, UA: NEGATIVE
Leukocytes,UA: NEGATIVE
Nitrite, UA: NEGATIVE
Protein,UA: NEGATIVE
RBC, UA: NEGATIVE
Specific Gravity, UA: 1.02 (ref 1.005–1.030)
Urobilinogen, Ur: 0.2 mg/dL (ref 0.2–1.0)
pH, UA: 7 (ref 5.0–7.5)

## 2019-10-25 LAB — BLADDER SCAN AMB NON-IMAGING: Scan Result: 38

## 2019-10-25 NOTE — Progress Notes (Signed)
10/25/2019 3:53 PM   Deborah Fields [id number] [id number]  Referring provider: Steele Sizer, MD [address] [place],  [place] [address]  Chief Complaint  Patient presents with  . Recurrent UTI    HPI: Deborah Fields is a 33 year old female with a history of recurrent urinary tract infections who presents today with a possible UTI.  She was last evaluated for recurrent UTIs through her office in 2017.  CT renal stone study in February 2017 was NED.  Cystoscopy in March 2017 was NED.   She was recently seen by her gynecologist, Dr. Valentino Saxon, on October 20, 2019 for symptoms of dysuria, malodorous urine, frequency and suprapubic pressure.  Urine dip was positive for leukocytes and she was initiated on nitrofurantoin.  Urine cultures were ordered but were not sent.  Today, she is experiencing frequency, urgency and dysuria.  She states that she is improving since starting the Macrobid.  Patient denies any gross hematuria, dysuria or suprapubic/flank pain.  Patient denies any fevers, chills, nausea or vomiting.   Her UA today is positive for trace ketone, 0-5 WBCs per high-power field, 0-10 epithelial cells per high-power field and moderate bacteria.  Her PVR is 38 mL.  PMH: Past Medical History:  Diagnosis Date  . Allergic rhinitis   . Anemia    during pregnancy  . Anxiety    managed by Psych  . Asthma   . Depression    managed by psych  . Erosive esophagitis    seen on Colonoscopy  . GERD (gastroesophageal reflux disease)   . Headache   . Heartburn   . Hiatal hernia    shown on colonoscopy  . IBS (irritable bowel syndrome)    per colonoscopy in Feb 2015  . MRSA (methicillin resistant Staphylococcus aureus)   . Panic disorder without agoraphobia    managed by Psych  . Pneumonia 2016  . PONV (postoperative nausea and vomiting)     Surgical History: Past Surgical History:  Procedure Laterality Date  . CESAREAN SECTION    . CESAREAN SECTION WITH BILATERAL TUBAL  LIGATION Bilateral 10/30/2017   Procedure: CESAREAN SECTION WITH BILATERAL TUBAL LIGATION;  Surgeon: Hildred Laser, MD;  Location: [facility] ORS;  Service: Obstetrics;  Laterality: Bilateral;  . COLONOSCOPY  Feb 2015   showed IBS, Hiatal Hernia, Erosive reflux esophagitis  . ESOPHAGOGASTRODUODENOSCOPY    . OVARIAN CYST REMOVAL Right   . TONSILLECTOMY    . TONSILLECTOMY      Home Medications:  Allergies as of 10/25/2019      Reactions   Sulfa Antibiotics Rash      Medication List       Accurate as of October 25, 2019  3:53 PM. If you have any questions, ask your nurse or doctor.        STOP taking these medications   ezetimibe-simvastatin 10-20 MG tablet Commonly known as: Vytorin Stopped by: Michiel Cowboy, PA-C   levonorgestrel-ethinyl estradiol 0.15-0.03 MG tablet Commonly known as: SEASONALE Stopped by: Skylan Lara, PA-C   Phentermine-Topiramate 3.75-23 MG Cp24 Stopped by: Michiel Cowboy, PA-C     TAKE these medications   albuterol 108 (90 Base) MCG/ACT inhaler Commonly known as: ProAir HFA Inhale 2 puffs into the lungs every 6 (six) hours as needed. Reported on 03/17/2016   clonazePAM 0.5 MG tablet Commonly known as: KLONOPIN Take 0.5 mg by mouth 2 (two) times daily as needed.   dexlansoprazole 60 MG capsule Commonly known as: Dexilant Take 1 capsule (60 mg  total) by mouth daily.   DULoxetine 60 MG capsule Commonly known as: CYMBALTA Take 60 mg by mouth daily.   fluticasone 50 MCG/ACT nasal spray Commonly known as: FLONASE USE 2 SPRAYS IN BOTH NOSTRILS ONCE A DAY   nitrofurantoin (macrocrystal-monohydrate) 100 MG capsule Commonly known as: MACROBID Take 1 capsule (100 mg total) by mouth 2 (two) times daily.   sertraline 100 MG tablet Commonly known as: ZOLOFT Take 200 mg by mouth daily.   SOLUBLE FIBER/PROBIOTICS PO Take 1 capsule at bedtime by mouth.       Allergies:  Allergies  Allergen Reactions  . Sulfa Antibiotics Rash    Family  History: Family History  Problem Relation Age of Onset  . Diabetes Mother   . Hypertension Mother   . Hyperlipidemia Mother   . COPD Mother   . Heart disease Father   . Hypertension Father   . Hyperlipidemia Father   . COPD Maternal Grandmother   . COPD Maternal Grandfather   . Cancer Paternal Grandfather        prostate  . Stroke Neg Hx   . Kidney disease Neg Hx   . Bladder Cancer Neg Hx     Social History:  reports that she has quit smoking. Her smoking use included cigarettes. She has a 2.50 pack-year smoking history. She has never used smokeless tobacco. She reports that she does not drink alcohol or use drugs.  ROS: UROLOGY Frequent Urination?: Yes Hard to postpone urination?: Yes Burning/pain with urination?: Yes Get up at night to urinate?: No Leakage of urine?: No Urine stream starts and stops?: No Trouble starting stream?: No Do you have to strain to urinate?: No Blood in urine?: No Urinary tract infection?: Yes Sexually transmitted disease?: No Injury to kidneys or bladder?: No Painful intercourse?: No Weak stream?: No Currently pregnant?: No Vaginal bleeding?: No Last menstrual period?: n  Gastrointestinal Nausea?: No Vomiting?: No Indigestion/heartburn?: No Diarrhea?: No Constipation?: No  Constitutional Fever: No Night sweats?: No Weight loss?: No Fatigue?: No  Skin Skin rash/lesions?: No Itching?: No  Eyes Blurred vision?: No Double vision?: No  Ears/Nose/Throat Sore throat?: No Sinus problems?: No  Hematologic/Lymphatic Swollen glands?: No Easy bruising?: No  Cardiovascular Leg swelling?: No Chest pain?: No  Respiratory Cough?: No Shortness of breath?: No  Endocrine Excessive thirst?: No  Musculoskeletal Back pain?: No Joint pain?: No  Neurological Headaches?: No Dizziness?: No  Psychologic Depression?: No Anxiety?: No  Physical Exam: BP 105/69   Pulse 85   Wt 191 lb (86.6 kg)   LMP 09/30/2019   BMI 29.91  kg/m   Constitutional:  Well nourished. Alert and oriented, No acute distress. HEENT: [place] AT, moist mucus membranes.  Trachea midline, no masses. Cardiovascular: No clubbing, cyanosis, or edema. Respiratory: Normal respiratory effort, no increased work of breathing. Neurologic: Grossly intact, no focal deficits, moving all 4 extremities. Psychiatric: Normal mood and affect.  Laboratory Data: Lab Results  Component Value Date   WBC 6.6 06/02/2019   HGB 13.7 06/02/2019   HCT 39.2 06/02/2019   MCV 81 06/02/2019   PLT 300 06/02/2019    Lab Results  Component Value Date   CREATININE 0.80 06/02/2019    No results found for: PSA  No results found for: TESTOSTERONE  Lab Results  Component Value Date   HGBA1C 5.2 06/02/2019    Lab Results  Component Value Date   TSH 1.100 01/13/2018       Component Value Date/Time   CHOL 277 (H) 06/02/2019  1557   HDL 29 (L) 06/02/2019 1557   CHOLHDL 9.6 (H) 06/02/2019 1557   LDLCALC Comment 06/02/2019 1557    Lab Results  Component Value Date   AST 25 06/02/2019   Lab Results  Component Value Date   ALT 26 06/02/2019   No components found for: ALKALINEPHOPHATASE No components found for: BILIRUBINTOTAL  No results found for: ESTRADIOL  Urinalysis Component     Latest Ref Rng & Units 10/25/2019  Specific Gravity, UA     1.005 - 1.030 1.020  pH, UA     5.0 - 7.5 7.0  Color, UA     Yellow Yellow  Appearance Ur     Clear Clear  Leukocytes,UA     Negative Negative  Protein,UA     Negative/Trace Negative  Glucose, UA     Negative Negative  Ketones, UA     Negative Trace (A)  RBC, UA     Negative Negative  Bilirubin, UA     Negative Negative  Urobilinogen, Ur     0.2 - 1.0 mg/dL 0.2  Nitrite, UA     Negative Negative  Microscopic Examination      See below:   Component     Latest Ref Rng & Units 10/25/2019  WBC, UA     0 - 5 /hpf 0-5  RBC     3.77 - 5.28 x10E6/uL None seen  Epithelial Cells (non renal)      0 - 10 /hpf 0-10  Bacteria, UA     None seen/Few Moderate (A)    I have reviewed the labs.   Pertinent Imaging: Results for Stark BrayHURDLE, Sparkle H (MRN [id number]) as of 10/25/2019 14:29  Ref. Range 10/25/2019 13:24  Scan Result Unknown 38   Assessment & Plan:    1. Recurrent UTI - Urinalysis, Complete - CULTURE, URINE COMPREHENSIVE - Bladder Scan (Post Void Residual) in office -Follow-up will be based on urine culture results.  If urine culture returns positive, we will ensure she is on the appropriate antibiotic continue therapy.  If urine culture is negative and she continues to experience symptoms will order renal ultrasound   Return for pending urine culture .  These notes generated with voice recognition software. I apologize for typographical errors.  Michiel CowboySHANNON Arwa Yero, PA-C  [facility] [address]  [address] [place], [place] [address] [phone]

## 2019-10-30 LAB — CULTURE, URINE COMPREHENSIVE

## 2019-11-02 ENCOUNTER — Ambulatory Visit: Payer: BC Managed Care – PPO | Admitting: Obstetrics and Gynecology

## 2019-11-02 ENCOUNTER — Encounter: Payer: Self-pay | Admitting: Obstetrics and Gynecology

## 2019-11-02 ENCOUNTER — Other Ambulatory Visit: Payer: BC Managed Care – PPO

## 2019-11-02 ENCOUNTER — Other Ambulatory Visit: Payer: Self-pay

## 2019-11-02 VITALS — BP 99/68 | HR 85 | Ht 67.0 in | Wt 190.3 lb

## 2019-11-02 DIAGNOSIS — F411 Generalized anxiety disorder: Secondary | ICD-10-CM | POA: Diagnosis not present

## 2019-11-02 DIAGNOSIS — E669 Obesity, unspecified: Secondary | ICD-10-CM

## 2019-11-02 DIAGNOSIS — F410 Panic disorder [episodic paroxysmal anxiety] without agoraphobia: Secondary | ICD-10-CM | POA: Diagnosis not present

## 2019-11-02 DIAGNOSIS — E785 Hyperlipidemia, unspecified: Secondary | ICD-10-CM

## 2019-11-02 DIAGNOSIS — F330 Major depressive disorder, recurrent, mild: Secondary | ICD-10-CM | POA: Diagnosis not present

## 2019-11-02 DIAGNOSIS — R399 Unspecified symptoms and signs involving the genitourinary system: Secondary | ICD-10-CM | POA: Diagnosis not present

## 2019-11-02 NOTE — Progress Notes (Signed)
    GYNECOLOGY PROGRESS NOTE  Subjective:    Patient ID: CALIYAH SIEH, female    DOB: 08-27-1986, 33 y.o.   MRN: [id number]  HPI  Patient is a 33 y.o. G25P2002 female who presents for follow up labs and weight check.  Patient with recent newly diagnosed dyslipidemia (with significant TG elevation, moderate LDL elevation, and low HDL).  Patient reports that she began changing her diet and exercising in July, and began the use of Orilistat (OTC) to aid with weight loss after insurance would not cover her prescription weight loss drug. Has also begun taking supplements including fish oil and red yeast rice (notes that her sister is also dealing with cholesterol issues and this was recommended by her doctor). Patient knows there is likely a genetic component as her father who was an athlete and active runner (still runs triathlons) required stent placement at age 38.  Patient notes that she has lost 16 lbs so far.  Denies complaints today, and overall is feeling healthier.   The following portions of the patient's history were reviewed and updated as appropriate: allergies, current medications, past family history, past medical history, past social history, past surgical history and problem list.  Review of Systems Pertinent items noted in HPI and remainder of comprehensive ROS otherwise negative.   Objective:   Blood pressure 99/68, pulse 85, height 5\' 7"  (1.702 m), weight 190 lb 4.8 oz (86.3 kg), last menstrual period 10/31/2019, unknown if currently breastfeeding. Body mass index is 29.81 kg/m.  General appearance: alert and no distress Abdomen: soft, non-tender. Waist circumference was 41 inches.     Labs:  Lab Results  Component Value Date   CHOL 277 (H) 06/02/2019   HDL 29 (L) 06/02/2019   LDLCALC Comment 06/02/2019   TRIG 661 (HH) 06/02/2019   CHOLHDL 9.6 (H) 06/02/2019    Assessment:   Dyslipidemia  Obesity (BMI 30.0-34.9)   Plan:   - Patient engaging in dietary and  lifestyle changes to correct dyslipidemia.  For repeat labs today. Based on labs, will determine if patient still requires medication at this time.  - Obesity, currently undergoing weight loss.  Has changed to overweight category at this time. Continued to encourage progress as patient noting good results.     Rubie Maid, MD [facility]

## 2019-11-02 NOTE — Patient Instructions (Signed)
Preventing High Cholesterol °Cholesterol is a white, waxy substance similar to fat that the human body needs to help build cells. The liver makes all the cholesterol that a person's body needs. Having high cholesterol (hypercholesterolemia) increases a person's risk for heart disease and stroke. Extra (excess) cholesterol comes from the food the person eats. °High cholesterol can often be prevented with diet and lifestyle changes. If you already have high cholesterol, you can control it with diet and lifestyle changes and with medicine. °How can high cholesterol affect me? °If you have high cholesterol, deposits (plaques) may build up on the walls of your arteries. The arteries are the blood vessels that carry blood away from your heart. °Plaques make the arteries narrower and stiffer. This can limit or block blood flow and cause blood clots to form. Blood clots: °· Are tiny balls of cells that form in your blood. °· Can move to the heart or brain, causing a heart attack or stroke. °Plaques in arteries greatly increase your risk for heart attack and stroke.Making diet and lifestyle changes can reduce your risk for these conditions that may threaten your life. °What can increase my risk? °This condition is more likely to develop in people who: °· Eat foods that are high in saturated fat or cholesterol. Saturated fat is mostly found in: °? Foods that contain animal fat, such as red meat and some dairy products. °? Certain fatty foods made from plants, such as tropical oils. °· Are overweight. °· Are not getting enough exercise. °· Have a family history of high cholesterol. °What actions can I take to prevent this? °Nutrition ° °· Eat less saturated fat. °· Avoid trans fats (partially hydrogenated oils). These are often found in margarine and in some baked goods, fried foods, and snacks bought in packages. °· Avoid precooked or cured meat, such as sausages or meat loaves. °· Avoid foods and drinks that have added  sugars. °· Eat more fruits, vegetables, and whole grains. °· Choose healthy sources of protein, such as fish, poultry, lean cuts of red meat, beans, peas, lentils, and nuts. °· Choose healthy sources of fat, such as: °? Nuts. °? Vegetable oils, especially olive oil. °? Fish that have healthy fats (omega-3 fatty acids), such as mackerel or salmon. °The items listed above may not be a complete list of recommended foods and beverages. Contact a dietitian for more information. °Lifestyle °· Lose weight if you are overweight. Losing 5-10 lb (2.3-4.5 kg) can help prevent or control high cholesterol. It can also lower your risk for diabetes and high blood pressure. Ask your health care provider to help you with a diet and exercise plan to lose weight safely. °· Do not use any products that contain nicotine or tobacco, such as cigarettes, e-cigarettes, and chewing tobacco. If you need help quitting, ask your health care provider. °· Limit your alcohol intake. °? Do not drink alcohol if: °§ Your health care provider tells you not to drink. °§ You are pregnant, may be pregnant, or are planning to become pregnant. °? If you drink alcohol: °§ Limit how much you use to: °§ 0-1 drink a day for women. °§ 0-2 drinks a day for men. °§ Be aware of how much alcohol is in your drink. In the [place] one drink equals one 12 oz bottle of beer (355 mL), one 5 oz glass of wine (148 mL), or one 1½ oz glass of hard liquor (44 mL). °Activity ° °· Get enough exercise. Each week, do at   least 150 minutes of exercise that takes a medium level of effort (moderate-intensity exercise). °? This is exercise that: °§ Makes your heart beat faster and makes you breathe harder than usual. °§ Allows you to still be able to talk. °? You could exercise in short sessions several times a day or longer sessions a few times a week. For example, on 5 days each week, you could walk fast or ride your bike 3 times a day for 10 minutes each time. °· Do exercises as told  by your health care provider. °Medicines °· In addition to diet and lifestyle changes, your health care provider may recommend medicines to help lower cholesterol. This may be a medicine to lower the amount of cholesterol your liver makes. You may need medicine if: °? Diet and lifestyle changes do not lower your cholesterol enough. °? You have high cholesterol and other risk factors for heart disease or stroke. °· Take over-the-counter and prescription medicines only as told by your health care provider. °General information °· Manage your risk factors for high cholesterol. Talk with your health care provider about all your risk factors and how to lower your risk. °· Manage other conditions that you have, such as diabetes or high blood pressure (hypertension). °· Have blood tests to check your cholesterol levels at regular points in time as told by your health care provider. °· Keep all follow-up visits as told by your health care provider. This is important. °Where to find more information °· American Heart Association: www.heart.org °· National Heart, Lung, and Blood Institute: www.nhlbi.nih.gov °Summary °· High cholesterol increases your risk for heart disease and stroke. By keeping your cholesterol level low, you can reduce your risk for these conditions. °· High cholesterol can often be prevented with diet and lifestyle changes. °· Work with your health care provider to manage your risk factors, and have your blood tested regularly. °This information is not intended to replace advice given to you by your health care provider. Make sure you discuss any questions you have with your health care provider. °Document Released: 12/16/2015 Document Revised: 03/25/2019 Document Reviewed: 08/09/2016 °Elsevier Patient Education © 2020 Elsevier Inc. ° °

## 2019-11-02 NOTE — Progress Notes (Signed)
Pt present for follow up for medication review and labs. Pt stated that she was doing well no problems.

## 2019-11-04 LAB — COMPREHENSIVE METABOLIC PANEL
ALT: 27 IU/L (ref 0–32)
AST: 28 IU/L (ref 0–40)
Albumin/Globulin Ratio: 1.9 (ref 1.2–2.2)
Albumin: 4.6 g/dL (ref 3.8–4.8)
Alkaline Phosphatase: 77 IU/L (ref 39–117)
BUN/Creatinine Ratio: 11 (ref 9–23)
BUN: 10 mg/dL (ref 6–20)
Bilirubin Total: 0.3 mg/dL (ref 0.0–1.2)
CO2: 23 mmol/L (ref 20–29)
Calcium: 9.5 mg/dL (ref 8.7–10.2)
Chloride: 103 mmol/L (ref 96–106)
Creatinine, Ser: 0.87 mg/dL (ref 0.57–1.00)
GFR calc Af Amer: 100 mL/min/{1.73_m2} (ref >59)
GFR calc non Af Amer: 87 mL/min/{1.73_m2} (ref >59)
Globulin, Total: 2.4 g/dL (ref 1.5–4.5)
Glucose: 71 mg/dL (ref 65–99)
Potassium: 4.1 mmol/L (ref 3.5–5.2)
Sodium: 142 mmol/L (ref 134–144)
Total Protein: 7 g/dL (ref 6.0–8.5)

## 2019-11-04 LAB — CBC WITH DIFFERENTIAL/PLATELET
Basophils Absolute: 0 10*3/uL (ref 0.0–0.2)
Basos: 0 %
EOS (ABSOLUTE): 0.2 10*3/uL (ref 0.0–0.4)
Eos: 4 %
Hematocrit: 39.2 % (ref 34.0–46.6)
Hemoglobin: 13.5 g/dL (ref 11.1–15.9)
Immature Grans (Abs): 0 10*3/uL (ref 0.0–0.1)
Immature Granulocytes: 0 %
Lymphocytes Absolute: 1.6 10*3/uL (ref 0.7–3.1)
Lymphs: 32 %
MCH: 29.7 pg (ref 26.6–33.0)
MCHC: 34.4 g/dL (ref 31.5–35.7)
MCV: 86 fL (ref 79–97)
Monocytes Absolute: 0.2 10*3/uL (ref 0.1–0.9)
Monocytes: 5 %
Neutrophils Absolute: 2.8 10*3/uL (ref 1.4–7.0)
Neutrophils: 59 %
Platelets: 259 10*3/uL (ref 150–450)
RBC: 4.55 x10E6/uL (ref 3.77–5.28)
RDW: 12.9 % (ref 11.7–15.4)
WBC: 4.9 10*3/uL (ref 3.4–10.8)

## 2019-11-04 LAB — URINE CULTURE

## 2019-11-04 LAB — HEMOGLOBIN A1C
Est. average glucose Bld gHb Est-mCnc: 97 mg/dL
Hgb A1c MFr Bld: 5 % (ref 4.8–5.6)

## 2019-11-04 LAB — LIPOPROTEIN A (LPA): Lipoprotein (a): 9.1 nmol/L (ref <75.0)

## 2019-11-09 ENCOUNTER — Telehealth: Payer: Self-pay

## 2019-11-09 NOTE — Telephone Encounter (Signed)
Pt called asking about lipid results. Pt states does not see them in her MyChart.

## 2019-11-18 ENCOUNTER — Telehealth: Payer: Self-pay

## 2019-11-18 NOTE — Telephone Encounter (Signed)
Spoke with pt concerning her lipid test. Was asked if she could come back to the office to have her Lipid levels redrawn due to me placing the incorrect order. Pt was informed that I did an add on a day after her labs were sent to the [facility] and they claimed to not have received the add on order. Pt stated that it was okay and that she did not mind coming in a redoing her labs.

## 2019-11-18 NOTE — Telephone Encounter (Signed)
Please see mychart and another phone encounter.

## 2019-11-21 ENCOUNTER — Other Ambulatory Visit: Payer: BC Managed Care – PPO

## 2019-11-21 ENCOUNTER — Other Ambulatory Visit: Payer: Self-pay

## 2019-11-21 DIAGNOSIS — Z13220 Encounter for screening for lipoid disorders: Secondary | ICD-10-CM | POA: Diagnosis not present

## 2019-11-22 LAB — LIPID PANEL
Chol/HDL Ratio: 7 ratio — ABNORMAL HIGH (ref 0.0–4.4)
Cholesterol, Total: 204 mg/dL — ABNORMAL HIGH (ref 100–199)
HDL: 29 mg/dL — ABNORMAL LOW (ref >39)
LDL Chol Calc (NIH): 117 mg/dL — ABNORMAL HIGH (ref 0–99)
Triglycerides: 332 mg/dL — ABNORMAL HIGH (ref 0–149)
VLDL Cholesterol Cal: 58 mg/dL — ABNORMAL HIGH (ref 5–40)

## 2019-11-23 DIAGNOSIS — Z20828 Contact with and (suspected) exposure to other viral communicable diseases: Secondary | ICD-10-CM | POA: Diagnosis not present

## 2019-12-06 DIAGNOSIS — Z20828 Contact with and (suspected) exposure to other viral communicable diseases: Secondary | ICD-10-CM | POA: Diagnosis not present

## 2019-12-12 DIAGNOSIS — Z20828 Contact with and (suspected) exposure to other viral communicable diseases: Secondary | ICD-10-CM | POA: Diagnosis not present

## 2019-12-23 ENCOUNTER — Ambulatory Visit: Payer: BC Managed Care – PPO | Attending: Internal Medicine

## 2019-12-23 DIAGNOSIS — Z20822 Contact with and (suspected) exposure to covid-19: Secondary | ICD-10-CM | POA: Diagnosis not present

## 2019-12-25 LAB — NOVEL CORONAVIRUS, NAA: SARS-CoV-2, NAA: NOT DETECTED

## 2020-01-09 DIAGNOSIS — Z20828 Contact with and (suspected) exposure to other viral communicable diseases: Secondary | ICD-10-CM | POA: Diagnosis not present

## 2020-01-16 DIAGNOSIS — F410 Panic disorder [episodic paroxysmal anxiety] without agoraphobia: Secondary | ICD-10-CM | POA: Diagnosis not present

## 2020-01-16 DIAGNOSIS — F332 Major depressive disorder, recurrent severe without psychotic features: Secondary | ICD-10-CM | POA: Diagnosis not present

## 2020-01-16 DIAGNOSIS — F411 Generalized anxiety disorder: Secondary | ICD-10-CM | POA: Diagnosis not present

## 2020-01-19 DIAGNOSIS — Z20828 Contact with and (suspected) exposure to other viral communicable diseases: Secondary | ICD-10-CM | POA: Diagnosis not present

## 2020-01-24 ENCOUNTER — Other Ambulatory Visit: Payer: Self-pay | Admitting: Gastroenterology

## 2020-02-02 DIAGNOSIS — Z20828 Contact with and (suspected) exposure to other viral communicable diseases: Secondary | ICD-10-CM | POA: Diagnosis not present

## 2020-02-06 DIAGNOSIS — Z03818 Encounter for observation for suspected exposure to other biological agents ruled out: Secondary | ICD-10-CM | POA: Diagnosis not present

## 2020-02-06 DIAGNOSIS — Z20828 Contact with and (suspected) exposure to other viral communicable diseases: Secondary | ICD-10-CM | POA: Diagnosis not present

## 2020-02-08 DIAGNOSIS — Z23 Encounter for immunization: Secondary | ICD-10-CM | POA: Diagnosis not present

## 2020-02-21 DIAGNOSIS — F410 Panic disorder [episodic paroxysmal anxiety] without agoraphobia: Secondary | ICD-10-CM | POA: Diagnosis not present

## 2020-02-21 DIAGNOSIS — F411 Generalized anxiety disorder: Secondary | ICD-10-CM | POA: Diagnosis not present

## 2020-02-21 DIAGNOSIS — F330 Major depressive disorder, recurrent, mild: Secondary | ICD-10-CM | POA: Diagnosis not present

## 2020-02-28 ENCOUNTER — Other Ambulatory Visit: Payer: Self-pay | Admitting: Podiatry

## 2020-02-28 ENCOUNTER — Ambulatory Visit (INDEPENDENT_AMBULATORY_CARE_PROVIDER_SITE_OTHER): Payer: BC Managed Care – PPO

## 2020-02-28 ENCOUNTER — Other Ambulatory Visit: Payer: Self-pay

## 2020-02-28 ENCOUNTER — Ambulatory Visit: Payer: BC Managed Care – PPO | Admitting: Podiatry

## 2020-02-28 DIAGNOSIS — M722 Plantar fascial fibromatosis: Secondary | ICD-10-CM

## 2020-02-28 DIAGNOSIS — M79672 Pain in left foot: Secondary | ICD-10-CM | POA: Diagnosis not present

## 2020-02-28 DIAGNOSIS — Z03818 Encounter for observation for suspected exposure to other biological agents ruled out: Secondary | ICD-10-CM | POA: Diagnosis not present

## 2020-02-28 DIAGNOSIS — Z20828 Contact with and (suspected) exposure to other viral communicable diseases: Secondary | ICD-10-CM | POA: Diagnosis not present

## 2020-02-29 ENCOUNTER — Encounter: Payer: Self-pay | Admitting: Podiatry

## 2020-02-29 NOTE — Progress Notes (Signed)
Subjective:  Patient ID: Deborah Fields, female    DOB: Mar 15, 1986,  MRN: [id number]  Chief Complaint  Patient presents with  . Foot Pain    pt is here for left heel pain located at the bottom of the left heel, pain has been going on for about a year. pt states that pain is also elevated when she is getting up after sitting, or in the morning time.    34 y.o. female presents with the above complaint.  Patient presents with left heel pain.  Patient states the pain has been going on for about a year has progressively gotten worse.  The pain is right on the bottom of the heel.  The pain is gradual.  Pain is elevated in the morning or while sitting or standing up and taking the first.  Patient is experiencing post static dyskinesia related pain.  Pain scale is 8 out of 10.  She has not tried anything at home.  She has not seen anyone else for this.  She would like to discuss various treatment options for this.   Review of Systems: Negative except as noted in the HPI. Denies N/V/F/Ch.  Past Medical History:  Diagnosis Date  . Allergic rhinitis   . Anemia    during pregnancy  . Anxiety    managed by Psych  . Asthma   . Depression    managed by psych  . Erosive esophagitis    seen on Colonoscopy  . GERD (gastroesophageal reflux disease)   . Headache   . Heartburn   . Hiatal hernia    shown on colonoscopy  . IBS (irritable bowel syndrome)    per colonoscopy in Feb 2015  . MRSA (methicillin resistant Staphylococcus aureus)   . Panic disorder without agoraphobia    managed by Psych  . Pneumonia 2016  . PONV (postoperative nausea and vomiting)     Current Outpatient Medications:  .  albuterol (PROAIR HFA) 108 (90 Base) MCG/ACT inhaler, Inhale 2 puffs into the lungs every 6 (six) hours as needed. Reported on 03/17/2016, Disp: 1 Inhaler, Rfl: 2 .  clonazePAM (KLONOPIN) 0.5 MG tablet, Take 0.5 mg by mouth 2 (two) times daily as needed., Disp: , Rfl: 1 .  DEXILANT 60 MG capsule, TAKE 1  CAPSULE BY MOUTH EVERY DAY, Disp: 90 capsule, Rfl: 1 .  DULoxetine (CYMBALTA) 60 MG capsule, Take 60 mg by mouth daily., Disp: , Rfl:  .  fluticasone (FLONASE) 50 MCG/ACT nasal spray, USE 2 SPRAYS IN BOTH NOSTRILS ONCE A DAY, Disp: 16 g, Rfl: 12 .  nitrofurantoin, macrocrystal-monohydrate, (MACROBID) 100 MG capsule, Take 1 capsule (100 mg total) by mouth 2 (two) times daily., Disp: 14 capsule, Rfl: 1 .  Probiotic Product (SOLUBLE FIBER/PROBIOTICS PO), Take 1 capsule at bedtime by mouth. , Disp: , Rfl:  .  sertraline (ZOLOFT) 100 MG tablet, Take 200 mg by mouth daily. , Disp: , Rfl: 3  Social History   Tobacco Use  Smoking Status Former Smoker  . Packs/day: 0.50  . Years: 5.00  . Pack years: 2.50  . Types: Cigarettes  Smokeless Tobacco Never Used  Tobacco Comment   quit 10 years    Allergies  Allergen Reactions  . Sulfa Antibiotics Rash   Objective:  There were no vitals filed for this visit. There is no height or weight on file to calculate BMI. Constitutional Well developed. Well nourished.  Vascular Dorsalis pedis pulses palpable bilaterally. Posterior tibial pulses palpable bilaterally. Capillary refill normal to  all digits.  No cyanosis or clubbing noted. Pedal hair growth normal.  Neurologic Normal speech. Oriented to person, place, and time. Epicritic sensation to light touch grossly present bilaterally.  Dermatologic Nails well groomed and normal in appearance. No open wounds. No skin lesions.  Orthopedic: Normal joint ROM without pain or crepitus bilaterally. No visible deformities. Tender to palpation at the calcaneal tuber left. No pain with calcaneal squeeze left. Ankle ROM full range of motion left. Silfverskiold Test: negative left.   Radiographs: Taken and reviewed. No acute fractures or dislocations. No evidence of stress fracture.  Plantar heel spur present. Posterior heel spur absent.   Assessment:   1. Foot pain, left   2. Plantar fasciitis of  left foot    Plan:  Patient was evaluated and treated and all questions answered.  Plantar Fasciitis, left - XR reviewed as above.  - Educated on icing and stretching. Instructions given.  - Injection delivered to the plantar fascia as below. - DME: Plantar Fascial Brace - Pharmacologic management: None  Procedure: Injection Tendon/Ligament Location: Left plantar fascia at the glabrous junction; medial approach. Skin Prep: alcohol Injectate: 0.5 cc 0.5% marcaine plain, 0.5 cc of 1% Lidocaine, 0.5 cc kenalog 10. Disposition: Patient tolerated procedure well. Injection site dressed with a band-aid.  No follow-ups on file.

## 2020-03-07 DIAGNOSIS — Z23 Encounter for immunization: Secondary | ICD-10-CM | POA: Diagnosis not present

## 2020-03-13 DIAGNOSIS — F410 Panic disorder [episodic paroxysmal anxiety] without agoraphobia: Secondary | ICD-10-CM | POA: Diagnosis not present

## 2020-03-13 DIAGNOSIS — F411 Generalized anxiety disorder: Secondary | ICD-10-CM | POA: Diagnosis not present

## 2020-03-13 DIAGNOSIS — F330 Major depressive disorder, recurrent, mild: Secondary | ICD-10-CM | POA: Diagnosis not present

## 2020-03-27 ENCOUNTER — Ambulatory Visit: Payer: BC Managed Care – PPO | Admitting: Podiatry

## 2020-03-27 ENCOUNTER — Other Ambulatory Visit: Payer: Self-pay

## 2020-03-27 DIAGNOSIS — M79672 Pain in left foot: Secondary | ICD-10-CM

## 2020-03-27 DIAGNOSIS — M722 Plantar fascial fibromatosis: Secondary | ICD-10-CM

## 2020-03-27 DIAGNOSIS — Q666 Other congenital valgus deformities of feet: Secondary | ICD-10-CM | POA: Diagnosis not present

## 2020-03-28 ENCOUNTER — Encounter: Payer: Self-pay | Admitting: Podiatry

## 2020-03-28 NOTE — Progress Notes (Signed)
Subjective:  Patient ID: Deborah Fields, female    DOB: 02/11/1986,  MRN: [id number]  Chief Complaint  Patient presents with  . Foot Pain    pt is here for 4 week plantar fasciitis of the left foot, pt states that she is doing a lot better since the last time she was here, pt also states that pf brace, as well as injection has helped as well.    34 y.o. female presents with the above complaint.  Patient presents with a follow-up of left plantar fasciitis.  Patient is doing a lot better.  She does not have any pain to the left heel anymore.  She her pain was resolved with bracing as well as injection.  She would like to know what the next treatment options to keep the plantar fasciitis away from long-term management.  She denies any other acute complaints.   Review of Systems: Negative except as noted in the HPI. Denies N/V/F/Ch.  Past Medical History:  Diagnosis Date  . Allergic rhinitis   . Anemia    during pregnancy  . Anxiety    managed by Psych  . Asthma   . Depression    managed by psych  . Erosive esophagitis    seen on Colonoscopy  . GERD (gastroesophageal reflux disease)   . Headache   . Heartburn   . Hiatal hernia    shown on colonoscopy  . IBS (irritable bowel syndrome)    per colonoscopy in Feb 2015  . MRSA (methicillin resistant Staphylococcus aureus)   . Panic disorder without agoraphobia    managed by Psych  . Pneumonia 2016  . PONV (postoperative nausea and vomiting)     Current Outpatient Medications:  .  albuterol (PROAIR HFA) 108 (90 Base) MCG/ACT inhaler, Inhale 2 puffs into the lungs every 6 (six) hours as needed. Reported on 03/17/2016, Disp: 1 Inhaler, Rfl: 2 .  clonazePAM (KLONOPIN) 0.5 MG tablet, Take 0.5 mg by mouth 2 (two) times daily as needed., Disp: , Rfl: 1 .  DEXILANT 60 MG capsule, TAKE 1 CAPSULE BY MOUTH EVERY DAY, Disp: 90 capsule, Rfl: 1 .  DULoxetine (CYMBALTA) 60 MG capsule, Take 60 mg by mouth daily., Disp: , Rfl:  .  fluticasone  (FLONASE) 50 MCG/ACT nasal spray, USE 2 SPRAYS IN BOTH NOSTRILS ONCE A DAY, Disp: 16 g, Rfl: 12 .  nitrofurantoin, macrocrystal-monohydrate, (MACROBID) 100 MG capsule, Take 1 capsule (100 mg total) by mouth 2 (two) times daily., Disp: 14 capsule, Rfl: 1 .  Probiotic Product (SOLUBLE FIBER/PROBIOTICS PO), Take 1 capsule at bedtime by mouth. , Disp: , Rfl:  .  sertraline (ZOLOFT) 100 MG tablet, Take 200 mg by mouth daily. , Disp: , Rfl: 3  Social History   Tobacco Use  Smoking Status Former Smoker  . Packs/day: 0.50  . Years: 5.00  . Pack years: 2.50  . Types: Cigarettes  Smokeless Tobacco Never Used  Tobacco Comment   quit 10 years    Allergies  Allergen Reactions  . Sulfa Antibiotics Rash   Objective:  There were no vitals filed for this visit. There is no height or weight on file to calculate BMI. Constitutional Well developed. Well nourished.  Vascular Dorsalis pedis pulses palpable bilaterally. Posterior tibial pulses palpable bilaterally. Capillary refill normal to all digits.  No cyanosis or clubbing noted. Pedal hair growth normal.  Neurologic Normal speech. Oriented to person, place, and time. Epicritic sensation to light touch grossly present bilaterally.  Dermatologic Nails well groomed  and normal in appearance. No open wounds. No skin lesions.  Orthopedic: Normal joint ROM without pain or crepitus bilaterally. No visible deformities. No tender to palpation at the calcaneal tuber left. No pain with calcaneal squeeze left. Ankle ROM full range of motion left. Silfverskiold Test: negative left.   Radiographs: None  Assessment:   1. Pes planovalgus   2. Plantar fasciitis of left foot   3. Foot pain, left    Plan:  Patient was evaluated and treated and all questions answered.  Plantar Fasciitis, left -Resolved completely with 1 steroid injection.  I will hold off on any further injection as her pain has completely resolved.  I discussed with her the  importance of long-term management of plantar fasciitis including stretching exercises as well as orthotics.  Semiflexible pes planus deformity -I explained to the patient the etiology of pes planus deformity and its relationship with plantar fasciitis and various treatment options were extensively discussed.  I believe she will benefit from custom-made orthotics to help control the hindfoot motion as well as support the arch of the foot and keep the plantar fasciitis away.  I discussed with the patient that this is a proper long-term management of plantar fasciitis and other foot and ankle issues. -She will be scheduled to see [facility] for custom-made orthotics    No follow-ups on file.

## 2020-04-05 DIAGNOSIS — L710 Perioral dermatitis: Secondary | ICD-10-CM | POA: Diagnosis not present

## 2020-04-10 ENCOUNTER — Ambulatory Visit (INDEPENDENT_AMBULATORY_CARE_PROVIDER_SITE_OTHER): Payer: Self-pay | Admitting: Orthotics

## 2020-04-10 ENCOUNTER — Other Ambulatory Visit: Payer: Self-pay

## 2020-04-10 DIAGNOSIS — Q666 Other congenital valgus deformities of feet: Secondary | ICD-10-CM

## 2020-04-10 DIAGNOSIS — M722 Plantar fascial fibromatosis: Secondary | ICD-10-CM

## 2020-04-10 NOTE — Progress Notes (Signed)

## 2020-04-13 ENCOUNTER — Other Ambulatory Visit: Payer: Self-pay | Admitting: Orthotics

## 2020-04-22 ENCOUNTER — Encounter: Payer: Self-pay | Admitting: Podiatry

## 2020-04-26 DIAGNOSIS — F410 Panic disorder [episodic paroxysmal anxiety] without agoraphobia: Secondary | ICD-10-CM | POA: Diagnosis not present

## 2020-04-26 DIAGNOSIS — F332 Major depressive disorder, recurrent severe without psychotic features: Secondary | ICD-10-CM | POA: Diagnosis not present

## 2020-04-26 DIAGNOSIS — F411 Generalized anxiety disorder: Secondary | ICD-10-CM | POA: Diagnosis not present

## 2020-04-30 ENCOUNTER — Telehealth: Payer: Self-pay | Admitting: *Deleted

## 2020-04-30 NOTE — Telephone Encounter (Signed)
"  I am calling because I'm a patient of Dr. Allena Katz.  He gave me an injection in my foot.  I had orthotics made.  Those orthotics have not come in yet.  I'm scheduled to pick them up next Wednesday.  It's been a couple of months since I had my injection.  My foot pain is pretty bad now.  He told me to let him know about that.  I sent him a message on MyChart.  I haven't heard anything from him.  Should I come in to see him again or should I wait and pick up the orthotics?  If you don't mind, give me a call back."

## 2020-05-01 NOTE — Telephone Encounter (Signed)
Yeah she can follow up with next week when I am in office.   Thank you

## 2020-05-01 NOTE — Telephone Encounter (Signed)
I left message for patient to call and schedule follow up appt with an available provider in the next week or so.

## 2020-05-02 ENCOUNTER — Other Ambulatory Visit: Payer: Self-pay

## 2020-05-02 ENCOUNTER — Ambulatory Visit: Payer: BC Managed Care – PPO | Admitting: Orthotics

## 2020-05-02 DIAGNOSIS — Q666 Other congenital valgus deformities of feet: Secondary | ICD-10-CM

## 2020-05-02 NOTE — Progress Notes (Signed)
F/O are in [place]; will have Morrie Sheldon p/up to bring to [place].  Patietn will come by and p/up/

## 2020-05-03 NOTE — Telephone Encounter (Signed)
I am calling to let you know that Dr. Allena Katz said to get you scheduled for an appointment on next week.  "Okay, when can I come in?"  He can see you on Tuesday, 05/08/2020 at 2:30 pm.  "Perfect, I'll be there."

## 2020-05-08 ENCOUNTER — Other Ambulatory Visit: Payer: Self-pay

## 2020-05-08 ENCOUNTER — Ambulatory Visit: Payer: BC Managed Care – PPO | Admitting: Podiatry

## 2020-05-08 DIAGNOSIS — M722 Plantar fascial fibromatosis: Secondary | ICD-10-CM

## 2020-05-08 DIAGNOSIS — M79672 Pain in left foot: Secondary | ICD-10-CM | POA: Diagnosis not present

## 2020-05-08 DIAGNOSIS — Q666 Other congenital valgus deformities of feet: Secondary | ICD-10-CM

## 2020-05-11 ENCOUNTER — Encounter: Payer: Self-pay | Admitting: Podiatry

## 2020-05-11 NOTE — Progress Notes (Signed)
Subjective:  Patient ID: Deborah Fields, female    DOB: 11/29/1986,  MRN: [id number]  Chief Complaint  Patient presents with  . Foot Pain    pt is here for plantar fasciitis of the left foot, pt states that her foot is still hurting and is looking to have it looked at. Pt also states that the injection she recieved last time has helped as well.    34 y.o. female presents with the above complaint.  Patient presents with a follow-up of left plantar fasciitis.  Patient recently went to beach and was walking on uneven ground that aggravated her plantar fascia.  Patient states the pain got worse.  She got her insoles which has been helping somewhat.  Patient states the injection receive the first time has helped a lot.  Her pain scale now is 8 out of 10.  She has tried stretching but has not helped much.  She has been wearing her braces.  She denies any other acute complaints.  She would also like to discuss long-term management for this.  Review of Systems: Negative except as noted in the HPI. Denies N/V/F/Ch.  Past Medical History:  Diagnosis Date  . Allergic rhinitis   . Anemia    during pregnancy  . Anxiety    managed by Psych  . Asthma   . Depression    managed by psych  . Erosive esophagitis    seen on Colonoscopy  . GERD (gastroesophageal reflux disease)   . Headache   . Heartburn   . Hiatal hernia    shown on colonoscopy  . IBS (irritable bowel syndrome)    per colonoscopy in Feb 2015  . MRSA (methicillin resistant Staphylococcus aureus)   . Panic disorder without agoraphobia    managed by Psych  . Pneumonia 2016  . PONV (postoperative nausea and vomiting)     Current Outpatient Medications:  .  albuterol (PROAIR HFA) 108 (90 Base) MCG/ACT inhaler, Inhale 2 puffs into the lungs every 6 (six) hours as needed. Reported on 03/17/2016, Disp: 1 Inhaler, Rfl: 2 .  clonazePAM (KLONOPIN) 0.5 MG tablet, Take 0.5 mg by mouth 2 (two) times daily as needed., Disp: , Rfl: 1 .   DEXILANT 60 MG capsule, TAKE 1 CAPSULE BY MOUTH EVERY DAY, Disp: 90 capsule, Rfl: 1 .  DULoxetine (CYMBALTA) 60 MG capsule, Take 60 mg by mouth daily., Disp: , Rfl:  .  fluticasone (FLONASE) 50 MCG/ACT nasal spray, USE 2 SPRAYS IN BOTH NOSTRILS ONCE A DAY, Disp: 16 g, Rfl: 12 .  minocycline (MINOCIN) 100 MG capsule, Take 100 mg by mouth 2 (two) times daily., Disp: , Rfl:  .  nitrofurantoin, macrocrystal-monohydrate, (MACROBID) 100 MG capsule, Take 1 capsule (100 mg total) by mouth 2 (two) times daily., Disp: 14 capsule, Rfl: 1 .  Probiotic Product (SOLUBLE FIBER/PROBIOTICS PO), Take 1 capsule at bedtime by mouth. , Disp: , Rfl:  .  sertraline (ZOLOFT) 100 MG tablet, Take 200 mg by mouth daily. , Disp: , Rfl: 3  Social History   Tobacco Use  Smoking Status Former Smoker  . Packs/day: 0.50  . Years: 5.00  . Pack years: 2.50  . Types: Cigarettes  Smokeless Tobacco Never Used  Tobacco Comment   quit 10 years    Allergies  Allergen Reactions  . Sulfa Antibiotics Rash   Objective:  There were no vitals filed for this visit. There is no height or weight on file to calculate BMI. Constitutional Well developed. Well nourished.  Vascular Dorsalis pedis pulses palpable bilaterally. Posterior tibial pulses palpable bilaterally. Capillary refill normal to all digits.  No cyanosis or clubbing noted. Pedal hair growth normal.  Neurologic Normal speech. Oriented to person, place, and time. Epicritic sensation to light touch grossly present bilaterally.  Dermatologic Nails well groomed and normal in appearance. No open wounds. No skin lesions.  Orthopedic: Normal joint ROM without pain or crepitus bilaterally. No visible deformities.  tender to palpation at the calcaneal tuber left. No pain with calcaneal squeeze left. Ankle ROM full range of motion left. Silfverskiold Test: negative left.   Radiographs: None  Assessment:   1. Pes planovalgus   2. Plantar fasciitis of left foot     3. Foot pain, left    Plan:  Patient was evaluated and treated and all questions answered.  Plantar Fasciitis, left - XR reviewed as above.  - Educated on icing and stretching. Instructions given.  - Injection delivered to the plantar fascia as below. - DME: None - Pharmacologic management: None   Procedure: Injection Tendon/Ligament Location: Left plantar fascia at the glabrous junction; medial approach. Skin Prep: alcohol Injectate: 0.5 cc 0.5% marcaine plain, 0.5 cc of 1% Lidocaine, 0.5 cc kenalog 10. Disposition: Patient tolerated procedure well. Injection site dressed with a band-aid.  No follow-ups on file.  Semiflexible pes planus deformity -I explained to the patient the etiology of pes planus deformity and its relationship with plantar fasciitis and various treatment options were extensively discussed.  I believe she will benefit from custom-made orthotics to help control the hindfoot motion as well as support the arch of the foot and keep the plantar fasciitis away.  I discussed with the patient that this is a proper long-term management of plantar fasciitis and other foot and ankle issues. -S orthotics were picked up and patient has been wearing them.  Good gait pattern noted with orthotics.    No follow-ups on file.

## 2020-05-14 ENCOUNTER — Encounter: Payer: Self-pay | Admitting: Podiatry

## 2020-05-22 ENCOUNTER — Encounter: Payer: Self-pay | Admitting: Podiatry

## 2020-05-22 ENCOUNTER — Other Ambulatory Visit: Payer: Self-pay

## 2020-05-22 ENCOUNTER — Ambulatory Visit (INDEPENDENT_AMBULATORY_CARE_PROVIDER_SITE_OTHER): Payer: BC Managed Care – PPO | Admitting: Podiatry

## 2020-05-22 DIAGNOSIS — M722 Plantar fascial fibromatosis: Secondary | ICD-10-CM

## 2020-05-22 DIAGNOSIS — M79672 Pain in left foot: Secondary | ICD-10-CM

## 2020-05-22 MED ORDER — MELOXICAM 15 MG PO TABS
15.0000 mg | ORAL_TABLET | Freq: Every day | ORAL | 3 refills | Status: DC
Start: 2020-05-22 — End: 2021-01-23

## 2020-05-23 ENCOUNTER — Encounter: Payer: Self-pay | Admitting: Podiatry

## 2020-05-23 NOTE — Progress Notes (Signed)
Subjective:  Patient ID: Deborah Fields, female    DOB: [birth year],  MRN: [id number]  Chief Complaint  Patient presents with  . Foot Pain    "the injection did not help this time and my foot is no better"    34 y.o. female presents with the above complaint.  Patient presents with a follow-up of left plantar fasciitis.  Patient states that her pain went away after the first injection however since then the pain has come back again.  The injection did not help that I had given previously.  She states that she has tried plantar fascial brace which helped a little bit but not much at all.  She has been doing her stretching exercises.  She would like to discuss any further treatment options at this time.  She denies any other acute complaints.  Review of Systems: Negative except as noted in the HPI. Denies N/V/F/Ch.  Past Medical History:  Diagnosis Date  . Allergic rhinitis   . Anemia    during pregnancy  . Anxiety    managed by Psych  . Asthma   . Depression    managed by psych  . Erosive esophagitis    seen on Colonoscopy  . GERD (gastroesophageal reflux disease)   . Headache   . Heartburn   . Hiatal hernia    shown on colonoscopy  . IBS (irritable bowel syndrome)    per colonoscopy in Feb 2015  . MRSA (methicillin resistant Staphylococcus aureus)   . Panic disorder without agoraphobia    managed by Psych  . Pneumonia 2016  . PONV (postoperative nausea and vomiting)     Current Outpatient Medications:  .  albuterol (PROAIR HFA) 108 (90 Base) MCG/ACT inhaler, Inhale 2 puffs into the lungs every 6 (six) hours as needed. Reported on 03/17/2016, Disp: 1 Inhaler, Rfl: 2 .  clonazePAM (KLONOPIN) 0.5 MG tablet, Take 0.5 mg by mouth 2 (two) times daily as needed., Disp: , Rfl: 1 .  DEXILANT 60 MG capsule, TAKE 1 CAPSULE BY MOUTH EVERY DAY, Disp: 90 capsule, Rfl: 1 .  DULoxetine (CYMBALTA) 60 MG capsule, Take 60 mg by mouth daily., Disp: , Rfl:  .  fluticasone (FLONASE) 50 MCG/ACT  nasal spray, USE 2 SPRAYS IN BOTH NOSTRILS ONCE A DAY, Disp: 16 g, Rfl: 12 .  meloxicam (MOBIC) 15 MG tablet, Take 1 tablet (15 mg total) by mouth daily., Disp: 30 tablet, Rfl: 3 .  minocycline (MINOCIN) 100 MG capsule, Take 100 mg by mouth 2 (two) times daily., Disp: , Rfl:  .  nitrofurantoin, macrocrystal-monohydrate, (MACROBID) 100 MG capsule, Take 1 capsule (100 mg total) by mouth 2 (two) times daily., Disp: 14 capsule, Rfl: 1 .  Probiotic Product (SOLUBLE FIBER/PROBIOTICS PO), Take 1 capsule at bedtime by mouth. , Disp: , Rfl:  .  sertraline (ZOLOFT) 100 MG tablet, Take 200 mg by mouth daily. , Disp: , Rfl: 3  Social History   Tobacco Use  Smoking Status Former Smoker  . Packs/day: 0.50  . Years: 5.00  . Pack years: 2.50  . Types: Cigarettes  Smokeless Tobacco Never Used  Tobacco Comment   quit 10 years    Allergies  Allergen Reactions  . Sulfa Antibiotics Rash   Objective:  There were no vitals filed for this visit. There is no height or weight on file to calculate BMI. Constitutional Well developed. Well nourished.  Vascular Dorsalis pedis pulses palpable bilaterally. Posterior tibial pulses palpable bilaterally. Capillary refill normal to all digits.  No cyanosis or clubbing noted. Pedal hair growth normal.  Neurologic Normal speech. Oriented to person, place, and time. Epicritic sensation to light touch grossly present bilaterally.  Dermatologic Nails well groomed and normal in appearance. No open wounds. No skin lesions.  Orthopedic: Normal joint ROM without pain or crepitus bilaterally. No visible deformities.  tender to palpation at the calcaneal tuber left. No pain with calcaneal squeeze left. Ankle ROM full range of motion left. Silfverskiold Test: negative left.   Radiographs: None  Assessment:   1. Plantar fasciitis of left foot   2. Foot pain, left    Plan:  Patient was evaluated and treated and all questions answered.  Plantar Fasciitis,  left - XR reviewed as above.  - Educated on icing and stretching. Instructions given.  -No further injection as they have not been adequately relieving her pain. - DME: Patient will be placed in cam boot for cam boot immobilization. - Pharmacologic management: Meloxicam was dispensed to help with acute inflammation.  No follow-ups on file.  Semiflexible pes planus deformity -I explained to the patient the etiology of pes planus deformity and its relationship with plantar fasciitis and various treatment options were extensively discussed.  I believe she will benefit from custom-made orthotics to help control the hindfoot motion as well as support the arch of the foot and keep the plantar fasciitis away.  I discussed with the patient that this is a proper long-term management of plantar fasciitis and other foot and ankle issues. -S orthotics were picked up and patient has been wearing them.  Good gait pattern noted with orthotics.    No follow-ups on file.

## 2020-06-19 ENCOUNTER — Ambulatory Visit: Payer: BC Managed Care – PPO | Admitting: Podiatry

## 2020-06-19 ENCOUNTER — Other Ambulatory Visit: Payer: Self-pay

## 2020-06-19 DIAGNOSIS — M79672 Pain in left foot: Secondary | ICD-10-CM

## 2020-06-19 DIAGNOSIS — M722 Plantar fascial fibromatosis: Secondary | ICD-10-CM | POA: Diagnosis not present

## 2020-06-19 NOTE — Progress Notes (Signed)
Subjective:  Patient ID: Deborah Fields, female    DOB: [birth year],  MRN: [id number]  Chief Complaint  Patient presents with  . Plantar Fasciitis    pt is here for a f/u of plantar fasciitis of the left foot, pt states that she is still in pain since the last time she was here, pt also states that the cam boot has been helping, but is concerned about wearing the boot long term, pt also states the meloxicam also causes drowsiness.    34 y.o. female presents with the above complaint.  Patient presents with a follow-up of left plantar fasciitis.  Patient states that her pain went away after the first injection however since then the pain has come back again.  The injection did not help that I had given previously.  She states that she has tried plantar fascial brace which helped a little bit but not much at all.  She has been doing her stretching exercises.  At this point she would like to discuss surgical options and think about surgery.  Patient states orthotics are somewhat helping but not that much.  The boot has helped tremendously as well.  Pain scale 0 out of 10.  Patient states the last injection did not help at all.  The meloxicam has been helping but states that she is feeling drowsy with it.  She denies any other acute complaints  Review of Systems: Negative except as noted in the HPI. Denies N/V/F/Ch.  Past Medical History:  Diagnosis Date  . Allergic rhinitis   . Anemia    during pregnancy  . Anxiety    managed by Psych  . Asthma   . Depression    managed by psych  . Erosive esophagitis    seen on Colonoscopy  . GERD (gastroesophageal reflux disease)   . Headache   . Heartburn   . Hiatal hernia    shown on colonoscopy  . IBS (irritable bowel syndrome)    per colonoscopy in Feb 2015  . MRSA (methicillin resistant Staphylococcus aureus)   . Panic disorder without agoraphobia    managed by Psych  . Pneumonia 2016  . PONV (postoperative nausea and vomiting)     Current  Outpatient Medications:  .  albuterol (PROAIR HFA) 108 (90 Base) MCG/ACT inhaler, Inhale 2 puffs into the lungs every 6 (six) hours as needed. Reported on 03/17/2016, Disp: 1 Inhaler, Rfl: 2 .  clonazePAM (KLONOPIN) 0.5 MG tablet, Take 0.5 mg by mouth 2 (two) times daily as needed., Disp: , Rfl: 1 .  DEXILANT 60 MG capsule, TAKE 1 CAPSULE BY MOUTH EVERY DAY, Disp: 90 capsule, Rfl: 1 .  DULoxetine (CYMBALTA) 60 MG capsule, Take 60 mg by mouth daily., Disp: , Rfl:  .  fluticasone (FLONASE) 50 MCG/ACT nasal spray, USE 2 SPRAYS IN BOTH NOSTRILS ONCE A DAY, Disp: 16 g, Rfl: 12 .  meloxicam (MOBIC) 15 MG tablet, Take 1 tablet (15 mg total) by mouth daily., Disp: 30 tablet, Rfl: 3 .  minocycline (MINOCIN) 100 MG capsule, Take 100 mg by mouth 2 (two) times daily., Disp: , Rfl:  .  nitrofurantoin, macrocrystal-monohydrate, (MACROBID) 100 MG capsule, Take 1 capsule (100 mg total) by mouth 2 (two) times daily., Disp: 14 capsule, Rfl: 1 .  Probiotic Product (SOLUBLE FIBER/PROBIOTICS PO), Take 1 capsule at bedtime by mouth. , Disp: , Rfl:  .  sertraline (ZOLOFT) 100 MG tablet, Take 200 mg by mouth daily. , Disp: , Rfl: 3  Social History  Tobacco Use  Smoking Status Former Smoker  . Packs/day: 0.50  . Years: 5.00  . Pack years: 2.50  . Types: Cigarettes  Smokeless Tobacco Never Used  Tobacco Comment   quit 10 years    Allergies  Allergen Reactions  . Sulfa Antibiotics Rash   Objective:  There were no vitals filed for this visit. There is no height or weight on file to calculate BMI. Constitutional Well developed. Well nourished.  Vascular Dorsalis pedis pulses palpable bilaterally. Posterior tibial pulses palpable bilaterally. Capillary refill normal to all digits.  No cyanosis or clubbing noted. Pedal hair growth normal.  Neurologic Normal speech. Oriented to person, place, and time. Epicritic sensation to light touch grossly present bilaterally.  Dermatologic Nails well groomed and  normal in appearance. No open wounds. No skin lesions.  Orthopedic: Normal joint ROM without pain or crepitus bilaterally. No visible deformities.  tender to palpation at the calcaneal tuber left. No pain with calcaneal squeeze left. Ankle ROM limited range of motion of the left Silfverskiold Test: Positive left   Radiographs: None  Assessment:   1. Plantar fasciitis of left foot   2. Foot pain, left    Plan:  Patient was evaluated and treated and all questions answered.  Plantar Fasciitis, left -It appears that the plantar fasciitis on the left has clinically gotten worse.  She has placed herself in the cam boot which has not helped at all.  The injection that I gave her last time also did not help.  I will hold off on any injection.  At this point I discussed with the patient that she will benefit from surgical intervention I discussed with her endoscopic plantar fasciotomy with gastrocnemius recession to address the equinus deformity as well as upon plantar fascial.  I discussed with her in extensive detail about various surgical options as well as options for EPAT therapy as well.  At this time patient would like to think about surgery and I will see her back again in 4 weeks to discuss it further. -Continue using cam boot for immobilization.  No follow-ups on file.  Semiflexible pes planus deformity -I explained to the patient the etiology of pes planus deformity and its relationship with plantar fasciitis and various treatment options were extensively discussed.  I believe she will benefit from custom-made orthotics to help control the hindfoot motion as well as support the arch of the foot and keep the plantar fasciitis away.  I discussed with the patient that this is a proper long-term management of plantar fasciitis and other foot and ankle issues. -S orthotics were picked up and patient has been wearing them.  Good gait pattern noted with orthotics.    No follow-ups on  file.

## 2020-06-22 ENCOUNTER — Encounter: Payer: Self-pay | Admitting: Podiatry

## 2020-07-03 ENCOUNTER — Other Ambulatory Visit: Payer: Self-pay

## 2020-07-03 ENCOUNTER — Ambulatory Visit: Payer: BC Managed Care – PPO | Admitting: Podiatry

## 2020-07-03 DIAGNOSIS — M722 Plantar fascial fibromatosis: Secondary | ICD-10-CM

## 2020-07-03 DIAGNOSIS — Z01818 Encounter for other preprocedural examination: Secondary | ICD-10-CM

## 2020-07-03 DIAGNOSIS — M216X2 Other acquired deformities of left foot: Secondary | ICD-10-CM | POA: Diagnosis not present

## 2020-07-03 DIAGNOSIS — M21862 Other specified acquired deformities of left lower leg: Secondary | ICD-10-CM

## 2020-07-03 NOTE — Patient Instructions (Signed)
Pre-Operative Instructions  Congratulations, you have decided to take an important step towards improving your quality of life.  You can be assured that the doctors and staff at [facility] will be with you every step of the way.  Here are some important things you should know:  1. Plan to be at the surgery center/hospital at least 1 (one) hour prior to your scheduled time, unless otherwise directed by the surgical center/hospital staff.  You must have a responsible adult accompany you, remain during the surgery and drive you home.  Make sure you have directions to the surgical center/hospital to ensure you arrive on time. 2. If you are having surgery at [facility] or [facility] hospitals, you will need a copy of your medical history and physical form from your family physician within one month prior to the date of surgery. We will give you a form for your primary physician to complete.  3. We make every effort to accommodate the date you request for surgery.  However, there are times where surgery dates or times have to be moved.  We will contact you as soon as possible if a change in schedule is required.   4. No aspirin/ibuprofen for one week before surgery.  If you are on aspirin, any non-steroidal anti-inflammatory medications (Mobic, Aleve, Ibuprofen) should not be taken seven (7) days prior to your surgery.  You make take Tylenol for pain prior to surgery.  5. Medications - If you are taking daily heart and blood pressure medications, seizure, reflux, allergy, asthma, anxiety, pain or diabetes medications, make sure you notify the surgery center/hospital before the day of surgery so they can tell you which medications you should take or avoid the day of surgery. 6. No food or drink after midnight the night before surgery unless directed otherwise by surgical center/hospital staff. 7. No alcoholic beverages 24-hours prior to surgery.  No smoking 24-hours prior or 24-hours after  surgery. 8. Wear loose pants or shorts. They should be loose enough to fit over bandages, boots, and casts. 9. Don't wear slip-on shoes. Sneakers are preferred. 10. Bring your boot with you to the surgery center/hospital.  Also bring crutches or a walker if your physician has prescribed it for you.  If you do not have this equipment, it will be provided for you after surgery. 11. If you have not been contacted by the surgery center/hospital by the day before your surgery, call to confirm the date and time of your surgery. 12. Leave-time from work may vary depending on the type of surgery you have.  Appropriate arrangements should be made prior to surgery with your employer. 13. Prescriptions will be provided immediately following surgery by your doctor.  Fill these as soon as possible after surgery and take the medication as directed. Pain medications will not be refilled on weekends and must be approved by the doctor. 14. Remove nail polish on the operative foot and avoid getting pedicures prior to surgery. 15. Wash the night before surgery.  The night before surgery wash the foot and leg well with water and the antibacterial soap provided. Be sure to pay special attention to beneath the toenails and in between the toes.  Wash for at least three (3) minutes. Rinse thoroughly with water and dry well with a towel.  Perform this wash unless told not to do so by your physician.  Enclosed: 1 Ice pack (please put in freezer the night before surgery)   1 Hibiclens skin cleaner     Pre-op instructions  If you have any questions regarding the instructions, please do not hesitate to call our office.  [place]: [address], [place], [place] [address] -- [phone]  [place]: [address] [place], [place] [address] -- [phone]  [place]: [address], [place], [place] [address] -- [phone]   Website: [email or web address] 

## 2020-07-04 ENCOUNTER — Encounter: Payer: Self-pay | Admitting: Podiatry

## 2020-07-04 NOTE — Progress Notes (Signed)
Subjective:  Patient ID: Deborah Fields, female    DOB: August 08, 1986,  MRN: [id number]  Chief Complaint  Patient presents with  . Foot Pain    pt is here for a f/u of plantar fasciitis of the left foot, pt states that the left is elevated to the touch. pt states that her pain was aggrated during her beach trip.    34 y.o. female presents with the above complaint.  Patient presents with a follow-up of left plantar fasciitis.  Patient states the pain has continued to get worse and nothing has helped anymore.  At this point patient would like to discuss surgical options.  Patient states that the beach trip may have thrown off the foot as well.  She denies any other acute complaints.  She is here to talk about surgery.  Review of Systems: Negative except as noted in the HPI. Denies N/V/F/Ch.  Past Medical History:  Diagnosis Date  . Allergic rhinitis   . Anemia    during pregnancy  . Anxiety    managed by Psych  . Asthma   . Depression    managed by psych  . Erosive esophagitis    seen on Colonoscopy  . GERD (gastroesophageal reflux disease)   . Headache   . Heartburn   . Hiatal hernia    shown on colonoscopy  . IBS (irritable bowel syndrome)    per colonoscopy in Feb 2015  . MRSA (methicillin resistant Staphylococcus aureus)   . Panic disorder without agoraphobia    managed by Psych  . Pneumonia 2016  . PONV (postoperative nausea and vomiting)     Current Outpatient Medications:  .  albuterol (PROAIR HFA) 108 (90 Base) MCG/ACT inhaler, Inhale 2 puffs into the lungs every 6 (six) hours as needed. Reported on 03/17/2016, Disp: 1 Inhaler, Rfl: 2 .  clonazePAM (KLONOPIN) 0.5 MG tablet, Take 0.5 mg by mouth 2 (two) times daily as needed., Disp: , Rfl: 1 .  DEXILANT 60 MG capsule, TAKE 1 CAPSULE BY MOUTH EVERY DAY, Disp: 90 capsule, Rfl: 1 .  DULoxetine (CYMBALTA) 60 MG capsule, Take 60 mg by mouth daily., Disp: , Rfl:  .  fluticasone (FLONASE) 50 MCG/ACT nasal spray, USE 2 SPRAYS IN  BOTH NOSTRILS ONCE A DAY, Disp: 16 g, Rfl: 12 .  meloxicam (MOBIC) 15 MG tablet, Take 1 tablet (15 mg total) by mouth daily., Disp: 30 tablet, Rfl: 3 .  minocycline (MINOCIN) 100 MG capsule, Take 100 mg by mouth 2 (two) times daily., Disp: , Rfl:  .  nitrofurantoin, macrocrystal-monohydrate, (MACROBID) 100 MG capsule, Take 1 capsule (100 mg total) by mouth 2 (two) times daily., Disp: 14 capsule, Rfl: 1 .  Probiotic Product (SOLUBLE FIBER/PROBIOTICS PO), Take 1 capsule at bedtime by mouth. , Disp: , Rfl:  .  sertraline (ZOLOFT) 100 MG tablet, Take 200 mg by mouth daily. , Disp: , Rfl: 3  Social History   Tobacco Use  Smoking Status Former Smoker  . Packs/day: 0.50  . Years: 5.00  . Pack years: 2.50  . Types: Cigarettes  Smokeless Tobacco Never Used  Tobacco Comment   quit 10 years    Allergies  Allergen Reactions  . Sulfa Antibiotics Rash   Objective:  There were no vitals filed for this visit. There is no height or weight on file to calculate BMI. Constitutional Well developed. Well nourished.  Vascular Dorsalis pedis pulses palpable bilaterally. Posterior tibial pulses palpable bilaterally. Capillary refill normal to all digits.  No cyanosis  or clubbing noted. Pedal hair growth normal.  Neurologic Normal speech. Oriented to person, place, and time. Epicritic sensation to light touch grossly present bilaterally.  Dermatologic Nails well groomed and normal in appearance. No open wounds. No skin lesions.  Orthopedic: Normal joint ROM without pain or crepitus bilaterally. No visible deformities.  tender to palpation at the calcaneal tuber left. No pain with calcaneal squeeze left. Ankle ROM limited range of motion of the left Silfverskiold Test: Positive left   Radiographs: None  Assessment:   1. Plantar fasciitis of left foot   2. Gastrocnemius equinus of left lower extremity   3. Preoperative examination    Plan:  Patient was evaluated and treated and all  questions answered.  Plantar Fasciitis, left with underlying gastrocnemius equinus -Clinically patient continues to have pain to the left plantar fascia.  At this point given that patient has failed all conservative therapy including cam boot immobilization, orthotics management, shoe gear modification as well as injections I believe patient at this point will benefit from surgical intervention with endoscopic plantar fasciotomy of the left as well as addressing the equinus component with gastrocnemius recession.  I discussed this in extensive detail with the patient as well as all the treatment options.  Patient would like to proceed with the surgical intervention.  I discussed with her in extensive detail my postop protocol which includes weightbearing as tolerated in a cam boot followed by transition back to regular sneakers over the next 6 weeks.  Patient states understanding -Informed surgical risk consent was reviewed and read aloud to the patient.  I reviewed the films.  I have discussed my findings with the patient in great detail.  I have discussed all risks including but not limited to infection, stiffness, scarring, limp, disability, deformity, damage to blood vessels and nerves, numbness, poor healing, need for braces, arthritis, chronic pain, amputation, death.  All benefits and realistic expectations discussed in great detail.  I have made no promises as to the outcome.  I have provided realistic expectations.  I have offered the patient a 2nd opinion, which they have declined and assured me they preferred to proceed despite the risks -A total of 32 minutes was spent in direct patient care as well as pre and post patient encounter activities.  This includes documentation as well as reviewing patient chart for labs, imaging, past medical, surgical, social, and family history as documented in the EMR.  I have reviewed medication allergies as documented in EMR.  I discussed the etiology of condition  and treatment options from conservative to surgical care.  All risks and benefit of the treatment course was discussed in detail.  All questions were answered and return appointment was discussed.  Since the visit completed in an ambulatory/outpatient setting, the patient and/or parent/guardian has been advised to contact the providers office for worsening condition and seek medical treatment and/or call 911 if the patient deems either is necessary.   No follow-ups on file.  Semiflexible pes planus deformity -I explained to the patient the etiology of pes planus deformity and its relationship with plantar fasciitis and various treatment options were extensively discussed.  I believe she will benefit from custom-made orthotics to help control the hindfoot motion as well as support the arch of the foot and keep the plantar fasciitis away.  I discussed with the patient that this is a proper long-term management of plantar fasciitis and other foot and ankle issues. -S orthotics were picked up and patient has been wearing  them.  Good gait pattern noted with orthotics.    No follow-ups on file.

## 2020-07-05 DIAGNOSIS — R079 Chest pain, unspecified: Secondary | ICD-10-CM | POA: Diagnosis not present

## 2020-07-05 DIAGNOSIS — R0602 Shortness of breath: Secondary | ICD-10-CM | POA: Diagnosis not present

## 2020-07-05 DIAGNOSIS — R002 Palpitations: Secondary | ICD-10-CM | POA: Diagnosis not present

## 2020-07-05 DIAGNOSIS — R000 Tachycardia, unspecified: Secondary | ICD-10-CM | POA: Diagnosis not present

## 2020-07-22 ENCOUNTER — Other Ambulatory Visit: Payer: Self-pay | Admitting: Gastroenterology

## 2020-08-01 DIAGNOSIS — F330 Major depressive disorder, recurrent, mild: Secondary | ICD-10-CM | POA: Diagnosis not present

## 2020-08-01 DIAGNOSIS — F410 Panic disorder [episodic paroxysmal anxiety] without agoraphobia: Secondary | ICD-10-CM | POA: Diagnosis not present

## 2020-08-01 DIAGNOSIS — F411 Generalized anxiety disorder: Secondary | ICD-10-CM | POA: Diagnosis not present

## 2020-08-09 DIAGNOSIS — F410 Panic disorder [episodic paroxysmal anxiety] without agoraphobia: Secondary | ICD-10-CM | POA: Diagnosis not present

## 2020-08-09 DIAGNOSIS — F411 Generalized anxiety disorder: Secondary | ICD-10-CM | POA: Diagnosis not present

## 2020-08-09 DIAGNOSIS — F332 Major depressive disorder, recurrent severe without psychotic features: Secondary | ICD-10-CM | POA: Diagnosis not present

## 2020-08-13 DIAGNOSIS — R0602 Shortness of breath: Secondary | ICD-10-CM | POA: Diagnosis not present

## 2020-08-13 DIAGNOSIS — R002 Palpitations: Secondary | ICD-10-CM | POA: Diagnosis not present

## 2020-08-13 DIAGNOSIS — R079 Chest pain, unspecified: Secondary | ICD-10-CM | POA: Diagnosis not present

## 2020-08-16 DIAGNOSIS — E782 Mixed hyperlipidemia: Secondary | ICD-10-CM | POA: Diagnosis not present

## 2020-08-16 DIAGNOSIS — E781 Pure hyperglyceridemia: Secondary | ICD-10-CM | POA: Diagnosis not present

## 2020-08-18 DIAGNOSIS — Z20822 Contact with and (suspected) exposure to covid-19: Secondary | ICD-10-CM | POA: Diagnosis not present

## 2020-08-23 ENCOUNTER — Other Ambulatory Visit: Payer: Self-pay | Admitting: Urology

## 2020-08-23 ENCOUNTER — Ambulatory Visit
Admission: RE | Admit: 2020-08-23 | Discharge: 2020-08-23 | Disposition: A | Discharge status: Home / Self Care | Payer: BC Managed Care – PPO | Source: Ambulatory Visit | Attending: Urology | Admitting: Urology

## 2020-08-23 ENCOUNTER — Other Ambulatory Visit: Payer: Self-pay

## 2020-08-23 ENCOUNTER — Ambulatory Visit (INDEPENDENT_AMBULATORY_CARE_PROVIDER_SITE_OTHER): Payer: BC Managed Care – PPO | Admitting: Urology

## 2020-08-23 VITALS — BP 113/75 | HR 98 | Ht 67.0 in | Wt 205.0 lb

## 2020-08-23 DIAGNOSIS — N390 Urinary tract infection, site not specified: Secondary | ICD-10-CM

## 2020-08-23 DIAGNOSIS — R109 Unspecified abdominal pain: Secondary | ICD-10-CM

## 2020-08-23 DIAGNOSIS — N201 Calculus of ureter: Secondary | ICD-10-CM | POA: Diagnosis not present

## 2020-08-23 DIAGNOSIS — Q057 Lumbar spina bifida without hydrocephalus: Secondary | ICD-10-CM | POA: Diagnosis not present

## 2020-08-23 DIAGNOSIS — N2889 Other specified disorders of kidney and ureter: Secondary | ICD-10-CM | POA: Diagnosis not present

## 2020-08-23 MED ORDER — TAMSULOSIN HCL 0.4 MG PO CAPS: 0.4000 mg | ORAL_CAPSULE | Freq: Every day | ORAL | 0 refills | Status: DC

## 2020-08-23 MED ORDER — ONDANSETRON HCL 4 MG PO TABS: 4.0000 mg | ORAL_TABLET | Freq: Three times a day (TID) | ORAL | 0 refills | Status: DC | PRN

## 2020-08-23 MED ORDER — OXYCODONE-ACETAMINOPHEN 10-325 MG PO TABS: 1.0000 | ORAL_TABLET | ORAL | 0 refills | Status: DC | PRN

## 2020-08-23 NOTE — Progress Notes (Signed)
08/23/2020 4:43 PM   Deborah Fields 03/25/1986 [id number]  Referring provider: Steele Sizer, MD No address on file  Chief Complaint  Patient presents with  . Flank Pain    HPI: [name] is a 34 year old female with a history of recurrent urinary tract infections who presents today with right sided flank pain.  She was last evaluated for recurrent UTIs through this office in 2017.  CT renal stone study in February 2017 was NED.  Cystoscopy in March 2017 was NED.   Today, she awoke with a pain in her right flank area that radiated to her right waist.  She initially attributed to sleeping wrong and stress, but the pain continued to intensify during the day.  She also had an episode of intense nausea with the pain.  She does not have a prior history of nephrolithiasis.  She has not had any urinary symptoms, but today's UA is greater than 30 RBCs.  Her last menstrual cycle was 2 weeks ago.  She has denied any vaginal bleeding.  Patient denies any modifying or aggravating factors.  Patient denies any gross hematuria and dysuria.  Patient denies any fevers, chills, nausea or vomiting.   KUB notes a 4 mm radiopaque calcification located at the level of L3 suspicious for ureteral calculi.   PMH: Past Medical History:  Diagnosis Date  . Allergic rhinitis   . Anemia    during pregnancy  . Anxiety    managed by Psych  . Asthma   . Depression    managed by psych  . Erosive esophagitis    seen on Colonoscopy  . GERD (gastroesophageal reflux disease)   . Headache   . Heartburn   . Hiatal hernia    shown on colonoscopy  . IBS (irritable bowel syndrome)    per colonoscopy in Feb 2015  . MRSA (methicillin resistant Staphylococcus aureus)   . Panic disorder without agoraphobia    managed by Psych  . Pneumonia 2016  . PONV (postoperative nausea and vomiting)     Surgical History: Past Surgical History:  Procedure Laterality Date  . CESAREAN SECTION    . CESAREAN  SECTION WITH BILATERAL TUBAL LIGATION Bilateral 10/30/2017   Procedure: CESAREAN SECTION WITH BILATERAL TUBAL LIGATION;  Surgeon: Hildred Laser, MD;  Location: [facility] ORS;  Service: Obstetrics;  Laterality: Bilateral;  . COLONOSCOPY  Feb 2015   showed IBS, Hiatal Hernia, Erosive reflux esophagitis  . ESOPHAGOGASTRODUODENOSCOPY    . OVARIAN CYST REMOVAL Right   . TONSILLECTOMY    . TONSILLECTOMY      Home Medications:  Allergies as of 08/23/2020      Reactions   Sulfa Antibiotics Rash      Medication List       Accurate as of August 23, 2020  4:43 PM. If you have any questions, ask your nurse or doctor.        STOP taking these medications   nitrofurantoin (macrocrystal-monohydrate) 100 MG capsule Commonly known as: MACROBID Stopped by: Murry Diaz, PA-C     TAKE these medications   albuterol 108 (90 Base) MCG/ACT inhaler Commonly known as: ProAir HFA Inhale 2 puffs into the lungs every 6 (six) hours as needed. Reported on 03/17/2016   clonazePAM 0.5 MG tablet Commonly known as: KLONOPIN Take 0.5 mg by mouth 2 (two) times daily as needed.   Dexilant 60 MG capsule Generic drug: dexlansoprazole TAKE 1 CAPSULE BY MOUTH EVERY DAY   DULoxetine 60 MG capsule Commonly known  as: CYMBALTA Take 60 mg by mouth daily.   fluticasone 50 MCG/ACT nasal spray Commonly known as: FLONASE USE 2 SPRAYS IN BOTH NOSTRILS ONCE A DAY   meloxicam 15 MG tablet Commonly known as: MOBIC Take 1 tablet (15 mg total) by mouth daily.   minocycline 100 MG capsule Commonly known as: MINOCIN Take 100 mg by mouth 2 (two) times daily.   ondansetron 4 MG tablet Commonly known as: Zofran Take 1 tablet (4 mg total) by mouth every 8 (eight) hours as needed for nausea or vomiting. Started by: Michiel Cowboy, PA-C   oxyCODONE-acetaminophen 10-325 MG tablet Commonly known as: Percocet Take 1 tablet by mouth every 4 (four) hours as needed for pain. Started by: Michiel Cowboy, PA-C   sertraline  100 MG tablet Commonly known as: ZOLOFT Take 200 mg by mouth daily.   SOLUBLE FIBER/PROBIOTICS PO Take 1 capsule at bedtime by mouth.   tamsulosin 0.4 MG Caps capsule Commonly known as: FLOMAX Take 1 capsule (0.4 mg total) by mouth daily. Started by: Michiel Cowboy, PA-C       Allergies:  Allergies  Allergen Reactions  . Sulfa Antibiotics Rash    Family History: Family History  Problem Relation Age of Onset  . Diabetes Mother   . Hypertension Mother   . Hyperlipidemia Mother   . COPD Mother   . Heart disease Father   . Hypertension Father   . Hyperlipidemia Father   . COPD Maternal Grandmother   . COPD Maternal Grandfather   . Cancer Paternal Grandfather        prostate  . Stroke Neg Hx   . Kidney disease Neg Hx   . Bladder Cancer Neg Hx     Social History:  reports that she has quit smoking. Her smoking use included cigarettes. She has a 2.50 pack-year smoking history. She has never used smokeless tobacco. She reports that she does not drink alcohol and does not use drugs.  ROS: For pertinent review of systems please refer to history of present illness  Physical Exam: BP 113/75   Pulse 98   Ht 5\' 7"  (1.702 m)   Wt 205 lb (93 kg)   LMP 08/13/2020   BMI 32.11 kg/m   Constitutional:  Well nourished. Alert and oriented, No acute distress. HEENT: [place] AT, mask in place.  Trachea midline, no masses. Cardiovascular: No clubbing, cyanosis, or edema. Respiratory: Normal respiratory effort, no increased work of breathing. Neurologic: Grossly intact, no focal deficits, moving all 4 extremities. Psychiatric: Normal mood and affect.   Laboratory Data: Lab Results  Component Value Date   WBC 4.9 11/02/2019   HGB 13.5 11/02/2019   HCT 39.2 11/02/2019   MCV 86 11/02/2019   PLT 259 11/02/2019    Lab Results  Component Value Date   CREATININE 0.87 11/02/2019     Lab Results  Component Value Date   HGBA1C 5.0 11/02/2019    Lab Results  Component Value  Date   TSH 1.100 01/13/2018       Component Value Date/Time   CHOL 204 (H) 11/21/2019 0834   HDL 29 (L) 11/21/2019 0834   CHOLHDL 7.0 (H) 11/21/2019 0834   LDLCALC 117 (H) 11/21/2019 0834    Lab Results  Component Value Date   AST 28 11/02/2019   Lab Results  Component Value Date   ALT 27 11/02/2019    Urinalysis Component     Latest Ref Rng & Units 08/23/2020  Specific Gravity, UA  1.005 - 1.030 1.020  pH, UA     5.0 - 7.5 6.0  Color, UA     Yellow Yellow  Appearance Ur     Clear Hazy (A)  Leukocytes,UA     Negative Negative  Protein,UA     Negative/Trace Negative  Glucose, UA     Negative Negative  Ketones, UA     Negative Negative  RBC, UA     Negative 3+ (A)  Bilirubin, UA     Negative Negative  Urobilinogen, Ur     0.2 - 1.0 mg/dL 0.2  Nitrite, UA     Negative Negative  Microscopic Examination      See below:   Component     Latest Ref Rng & Units 08/23/2020  WBC, UA     0 - 5 /hpf 0-5  RBC     0 - 2 /hpf >30 (A)  Epithelial Cells (non renal)     0 - 10 /hpf 0-10  Casts     None seen /lpf Present (A)  Cast Type     N/A Hyaline casts  Mucus, UA     Not Estab. Present (A)  Bacteria, UA     None seen/Few Few    I have reviewed the labs.   Pertinent Imaging: CLINICAL DATA:  Right flank pain.  EXAM: ABDOMEN - 1 VIEW  COMPARISON:  CT 01/18/2012.  FINDINGS: 4 mm calcification noted over the right kidney suggesting right nephrolithiasis. 4 mm calcification noted the right mid pelvis. A right ureteral stone could present this fashion. No bowel distention. Stool noted throughout the colon. No acute bony abnormality. Spina bifida at L5.  IMPRESSION: 1.  4 mm right renal calcification suggesting right renal stone.  2. 4 mm calcification mid right pelvis suggesting distal right ureteral stone.   Electronically Signed   By: Maisie Fus  Register   On: 08/24/2020 07:59 I have independently reviewed the films.  See HPI.    Assessment & Plan:    1. Right flank pain Likely secondary to a proximal right ureteral stone  patient given prescription for Percocet 10/325, #10 every 4 hours as needed for pain, tamsulosin 0.4 mg daily and Zofran 4 mg as needed for nausea She is also given a strainer instructed to strain urine and to bring fragment in for analysis She will return next week for KUB and UA Patient is advised that if they should start to experience pain that is not able to be controlled with pain medication, intractable nausea and/or vomiting and/or fevers greater than 103 or shaking chills to contact the office immediately or seek treatment in the emergency department for emergent intervention.    2. Microscopic hematuria Today's UA with greater than 30 RBCs Micro heme is likely due to ureteral stone We will recheck UA upon passage of stone to ensure hematuria resolves   Return in about 1 week (around 08/30/2020) for KUB and UA .  These notes generated with voice recognition software. I apologize for typographical errors.  Michiel Cowboy, PA-C  [facility] [address]  [address] [place], [place] [address] [phone]

## 2020-08-24 ENCOUNTER — Telehealth: Payer: Self-pay | Admitting: Urology

## 2020-08-24 ENCOUNTER — Telehealth: Payer: Self-pay | Admitting: Family Medicine

## 2020-08-24 LAB — MICROSCOPIC EXAMINATION: RBC, Urine: >30 /hpf — AB (ref 0–2)

## 2020-08-24 LAB — URINALYSIS, COMPLETE
Bilirubin, UA: NEGATIVE
Glucose, UA: NEGATIVE
Ketones, UA: NEGATIVE
Leukocytes,UA: NEGATIVE
Nitrite, UA: NEGATIVE
Protein,UA: NEGATIVE
Specific Gravity, UA: 1.02 (ref 1.005–1.030)
Urobilinogen, Ur: 0.2 mg/dL (ref 0.2–1.0)
pH, UA: 6 (ref 5.0–7.5)

## 2020-08-24 MED ORDER — AMOXICILLIN-POT CLAVULANATE 875-125 MG PO TABS: 1.0000 | ORAL_TABLET | Freq: Two times a day (BID) | ORAL | 0 refills | Status: DC

## 2020-08-24 NOTE — Telephone Encounter (Signed)
Patient notified and voiced understanding.

## 2020-08-24 NOTE — Telephone Encounter (Signed)
-----   Message from Harle Battiest, PA-C sent at 08/24/2020  8:12 AM EDT ----- Please let Deborah Fields congratulation on passing her stone last night.  I recommend keeping her appointment next week for the Xray and to bring the stone in to send for analysis.  I still want to do the Xray to ensure all stones are passed.

## 2020-08-24 NOTE — Telephone Encounter (Signed)
The patient states that she passed stone last night. Pain on the right side has resolved, she now has pain in the exact same flank area except pain is now on the left side. Pain is intense and widespread.

## 2020-08-24 NOTE — Telephone Encounter (Signed)
sw patient. Patient aware, sent meds to pharm. Scheduled appt for Monday in [place]

## 2020-08-24 NOTE — Telephone Encounter (Signed)
She has contacted the office and left a voice mail stating she has pain on the left side now.  Would you call and get more information?

## 2020-08-24 NOTE — Telephone Encounter (Signed)
Let's call her in an antibiotic, Augmentin 875/125, BID x 7 days.  She has pain medication, Zofran and Flomax.  Let's get her in to see me in [place] on Monday.  If pain gets worse or she develops a fever over the weekend, she will need to seek treatment in the ED.

## 2020-08-26 ENCOUNTER — Encounter: Payer: Self-pay | Admitting: Urology

## 2020-08-26 NOTE — Progress Notes (Deleted)
08/27/2020 7:19 PM   Deborah Fields 06-09-1986 [id number]  Referring provider: Steele Sizer, MD No address on file  No chief complaint on file.   HPI: Deborah Fields is a 34 year old female with a history of recurrent urinary tract infections who presents today with right sided flank pain.  She was last evaluated for recurrent UTIs through this office in 2017.  CT renal stone study in February 2017 was NED.  Cystoscopy in March 2017 was NED.   On 08/23/2020, she awoke with a pain in her right flank area that radiated to her right waist.  She initially attributed to sleeping wrong and stress, but the pain continued to intensify during the day.  She also had an episode of intense nausea with the pain.  She does not have a prior history of nephrolithiasis.  She has not had any urinary symptoms, but today's UA is greater than 30 RBCs.  Her last menstrual cycle was 2 weeks ago.  She has denied any vaginal bleeding.  Patient denies any modifying or aggravating factors.  Patient denies any gross hematuria and dysuria.  Patient denies any fevers, chills, nausea or vomiting.  KUB notes a 4 mm radiopaque calcification located at the level of L3 suspicious for ureteral calculi.  She passed a stone that evening.    She contacted the office the next day with complaints of left sided flank pain and dysuria.  A prescription was sent for Augmentin.    ***   PMH: Past Medical History:  Diagnosis Date  . Allergic rhinitis   . Anemia    during pregnancy  . Anxiety    managed by Psych  . Asthma   . Depression    managed by psych  . Erosive esophagitis    seen on Colonoscopy  . GERD (gastroesophageal reflux disease)   . Headache   . Heartburn   . Hiatal hernia    shown on colonoscopy  . IBS (irritable bowel syndrome)    per colonoscopy in Feb 2015  . MRSA (methicillin resistant Staphylococcus aureus)   . Panic disorder without agoraphobia    managed by Psych  . Pneumonia 2016  .  PONV (postoperative nausea and vomiting)     Surgical History: Past Surgical History:  Procedure Laterality Date  . CESAREAN SECTION    . CESAREAN SECTION WITH BILATERAL TUBAL LIGATION Bilateral 10/30/2017   Procedure: CESAREAN SECTION WITH BILATERAL TUBAL LIGATION;  Surgeon: Deborah Laser, MD;  Location: [facility] ORS;  Service: Obstetrics;  Laterality: Bilateral;  . COLONOSCOPY  Feb 2015   showed IBS, Hiatal Hernia, Erosive reflux esophagitis  . ESOPHAGOGASTRODUODENOSCOPY    . OVARIAN CYST REMOVAL Right   . TONSILLECTOMY    . TONSILLECTOMY      Home Medications:  Allergies as of 08/27/2020      Reactions   Sulfa Antibiotics Rash      Medication List       Accurate as of August 26, 2020  7:19 PM. If you have any questions, ask your nurse or doctor.        albuterol 108 (90 Base) MCG/ACT inhaler Commonly known as: ProAir HFA Inhale 2 puffs into the lungs every 6 (six) hours as needed. Reported on 03/17/2016   amoxicillin-clavulanate 875-125 MG tablet Commonly known as: AUGMENTIN Take 1 tablet by mouth every 12 (twelve) hours.   clonazePAM 0.5 MG tablet Commonly known as: KLONOPIN Take 0.5 mg by mouth 2 (two) times daily as needed.   Dexilant  60 MG capsule Generic drug: dexlansoprazole TAKE 1 CAPSULE BY MOUTH EVERY DAY   DULoxetine 60 MG capsule Commonly known as: CYMBALTA Take 60 mg by mouth daily.   fluticasone 50 MCG/ACT nasal spray Commonly known as: FLONASE USE 2 SPRAYS IN BOTH NOSTRILS ONCE A DAY   meloxicam 15 MG tablet Commonly known as: MOBIC Take 1 tablet (15 mg total) by mouth daily.   minocycline 100 MG capsule Commonly known as: MINOCIN Take 100 mg by mouth 2 (two) times daily.   ondansetron 4 MG tablet Commonly known as: Zofran Take 1 tablet (4 mg total) by mouth every 8 (eight) hours as needed for nausea or vomiting.   oxyCODONE-acetaminophen 10-325 MG tablet Commonly known as: Percocet Take 1 tablet by mouth every 4 (four) hours as needed  for pain.   sertraline 100 MG tablet Commonly known as: ZOLOFT Take 200 mg by mouth daily.   SOLUBLE FIBER/PROBIOTICS PO Take 1 capsule at bedtime by mouth.   tamsulosin 0.4 MG Caps capsule Commonly known as: FLOMAX Take 1 capsule (0.4 mg total) by mouth daily.       Allergies:  Allergies  Allergen Reactions  . Sulfa Antibiotics Rash    Family History: Family History  Problem Relation Age of Onset  . Diabetes Mother   . Hypertension Mother   . Hyperlipidemia Mother   . COPD Mother   . Heart disease Father   . Hypertension Father   . Hyperlipidemia Father   . COPD Maternal Grandmother   . COPD Maternal Grandfather   . Cancer Paternal Grandfather        prostate  . Stroke Neg Hx   . Kidney disease Neg Hx   . Bladder Cancer Neg Hx     Social History:  reports that she has quit smoking. Her smoking use included cigarettes. She has a 2.50 pack-year smoking history. She has never used smokeless tobacco. She reports that she does not drink alcohol and does not use drugs.  ROS: For pertinent review of systems please refer to history of present illness  Physical Exam: LMP 08/13/2020   Constitutional:  Well nourished. Alert and oriented, No acute distress. HEENT: Deborah Fields AT, moist mucus membranes.  Trachea midline, no masses. Cardiovascular: No clubbing, cyanosis, or edema. Respiratory: Normal respiratory effort, no increased work of breathing. GI: Abdomen is soft, non tender, non distended, no abdominal masses. Liver and spleen not palpable.  No hernias appreciated.  Stool sample for occult testing is not indicated.   GU: No CVA tenderness.  No bladder fullness or masses.  *** external genitalia, *** pubic hair distribution, no lesions.  Normal urethral meatus, no lesions, no prolapse, no discharge.   No urethral masses, tenderness and/or tenderness. No bladder fullness, tenderness or masses. *** vagina mucosa, *** estrogen effect, no discharge, no lesions, *** pelvic support,  *** cystocele and *** rectocele noted.  No cervical motion tenderness.  Uterus is freely mobile and non-fixed.  No adnexal/parametria masses or tenderness noted.  Anus and perineum are without rashes or lesions.   ***  Skin: No rashes, bruises or suspicious lesions. Lymph: No cervical or inguinal adenopathy. Neurologic: Grossly intact, no focal deficits, moving all 4 extremities. Psychiatric: Normal mood and affect.    Laboratory Data: Lab Results  Component Value Date   WBC 4.9 11/02/2019   HGB 13.5 11/02/2019   HCT 39.2 11/02/2019   MCV 86 11/02/2019   PLT 259 11/02/2019    Lab Results  Component Value Date  CREATININE 0.87 11/02/2019     Lab Results  Component Value Date   HGBA1C 5.0 11/02/2019    Lab Results  Component Value Date   TSH 1.100 01/13/2018       Component Value Date/Time   CHOL 204 (H) 11/21/2019 0834   HDL 29 (L) 11/21/2019 0834   CHOLHDL 7.0 (H) 11/21/2019 0834   LDLCALC 117 (H) 11/21/2019 0834    Lab Results  Component Value Date   AST 28 11/02/2019   Lab Results  Component Value Date   ALT 27 11/02/2019    Urinalysis ***   I have reviewed the labs.   Pertinent Imaging: *** I have independently reviewed the films.  See HPI.   Assessment & Plan:    1. Right flank pain Likely secondary to a proximal right ureteral stone  patient given prescription for Percocet 10/325, #10 every 4 hours as needed for pain, tamsulosin 0.4 mg daily and Zofran 4 mg as needed for nausea She is also given a strainer instructed to strain urine and to bring fragment in for analysis She will return next week for KUB and UA Patient is advised that if they should start to experience pain that is not able to be controlled with pain medication, intractable nausea and/or vomiting and/or fevers greater than 103 or shaking chills to contact the office immediately or seek treatment in the emergency department for emergent intervention.    2. Microscopic  hematuria Today's UA with greater than 30 RBCs Micro heme is likely due to ureteral stone We will recheck UA upon passage of stone to ensure hematuria resolves   No follow-ups on file.  These notes generated with voice recognition software. I apologize for typographical errors.  Michiel Cowboy, PA-C  [facility] [address]  [address] [place], [place] [address] [phone]

## 2020-08-27 ENCOUNTER — Ambulatory Visit: Payer: Self-pay | Admitting: Urology

## 2020-08-27 LAB — CULTURE, URINE COMPREHENSIVE

## 2020-08-28 NOTE — Progress Notes (Signed)
08/29/2020 1:55 PM   Senia Even Kulpa [birth year] [id number]  Referring provider: Steele Sizer, MD No address on file  Chief Complaint  Patient presents with  . Nephrolithiasis    HPI: Mrs. Wachter is a 34 year old female with a history of recurrent urinary tract infections who presents today with right sided flank pain.  She was last evaluated for recurrent UTIs through this office in 2017.  CT renal stone study in February 2017 was NED.  Cystoscopy in March 2017 was NED.   On 08/23/2020, she awoke with a pain in her right flank area that radiated to her right waist.  She initially attributed to sleeping wrong and stress, but the pain continued to intensify during the day.  She also had an episode of intense nausea with the pain.  She does not have a prior history of nephrolithiasis.  She has not had any urinary symptoms, but today's UA is greater than 30 RBCs.  Her last menstrual cycle was 2 weeks ago.  She has denied any vaginal bleeding.  Patient denies any modifying or aggravating factors.  Patient denies any gross hematuria and dysuria.  Patient denies any fevers, chills, nausea or vomiting.  KUB notes a 4 mm radiopaque calcification located at the level of L3 suspicious for ureteral calculi.  She passed a stone that evening.    She contacted the office the next day with complaints of left sided flank pain and dysuria.  A prescription was sent for Augmentin.    Today she is asymptomatic.  She states the left-sided discomfort that she contacted the office abated the next day.  Patient denies any modifying or aggravating factors.  Patient denies any gross hematuria, dysuria or suprapubic/flank pain.  Patient denies any fevers, chills, nausea or vomiting.   KUB right distal ureteral stone no longer seen.    UA today negative for micro heme.     PMH: Past Medical History:  Diagnosis Date  . Allergic rhinitis   . Anemia    during pregnancy  . Anxiety    managed by Psych   . Asthma   . Depression    managed by psych  . Erosive esophagitis    seen on Colonoscopy  . GERD (gastroesophageal reflux disease)   . Headache   . Heartburn   . Hiatal hernia    shown on colonoscopy  . IBS (irritable bowel syndrome)    per colonoscopy in Feb 2015  . MRSA (methicillin resistant Staphylococcus aureus)   . Panic disorder without agoraphobia    managed by Psych  . Pneumonia 2016  . PONV (postoperative nausea and vomiting)     Surgical History: Past Surgical History:  Procedure Laterality Date  . CESAREAN SECTION    . CESAREAN SECTION WITH BILATERAL TUBAL LIGATION Bilateral 10/30/2017   Procedure: CESAREAN SECTION WITH BILATERAL TUBAL LIGATION;  Surgeon: Hildred Laser, MD;  Location: [facility] ORS;  Service: Obstetrics;  Laterality: Bilateral;  . COLONOSCOPY  Feb 2015   showed IBS, Hiatal Hernia, Erosive reflux esophagitis  . ESOPHAGOGASTRODUODENOSCOPY    . OVARIAN CYST REMOVAL Right   . TONSILLECTOMY    . TONSILLECTOMY      Home Medications:  Allergies as of 08/29/2020      Reactions   Sulfa Antibiotics Rash      Medication List       Accurate as of August 29, 2020  1:55 PM. If you have any questions, ask your nurse or doctor.  albuterol 108 (90 Base) MCG/ACT inhaler Commonly known as: ProAir HFA Inhale 2 puffs into the lungs every 6 (six) hours as needed. Reported on 03/17/2016   amoxicillin-clavulanate 875-125 MG tablet Commonly known as: AUGMENTIN Take 1 tablet by mouth every 12 (twelve) hours.   clonazePAM 0.5 MG tablet Commonly known as: KLONOPIN Take 0.5 mg by mouth 2 (two) times daily as needed.   Dexilant 60 MG capsule Generic drug: dexlansoprazole TAKE 1 CAPSULE BY MOUTH EVERY DAY   DULoxetine 60 MG capsule Commonly known as: CYMBALTA Take 60 mg by mouth daily.   fluticasone 50 MCG/ACT nasal spray Commonly known as: FLONASE USE 2 SPRAYS IN BOTH NOSTRILS ONCE A DAY   meloxicam 15 MG tablet Commonly known as: MOBIC Take  1 tablet (15 mg total) by mouth daily.   minocycline 100 MG capsule Commonly known as: MINOCIN Take 100 mg by mouth 2 (two) times daily.   ondansetron 4 MG tablet Commonly known as: Zofran Take 1 tablet (4 mg total) by mouth every 8 (eight) hours as needed for nausea or vomiting.   oxyCODONE-acetaminophen 10-325 MG tablet Commonly known as: Percocet Take 1 tablet by mouth every 4 (four) hours as needed for pain.   sertraline 100 MG tablet Commonly known as: ZOLOFT Take 200 mg by mouth daily.   SOLUBLE FIBER/PROBIOTICS PO Take 1 capsule at bedtime by mouth.   tamsulosin 0.4 MG Caps capsule Commonly known as: FLOMAX Take 1 capsule (0.4 mg total) by mouth daily.       Allergies:  Allergies  Allergen Reactions  . Sulfa Antibiotics Rash    Family History: Family History  Problem Relation Age of Onset  . Diabetes Mother   . Hypertension Mother   . Hyperlipidemia Mother   . COPD Mother   . Heart disease Father   . Hypertension Father   . Hyperlipidemia Father   . COPD Maternal Grandmother   . COPD Maternal Grandfather   . Cancer Paternal Grandfather        prostate  . Stroke Neg Hx   . Kidney disease Neg Hx   . Bladder Cancer Neg Hx     Social History:  reports that she has quit smoking. Her smoking use included cigarettes. She has a 2.50 pack-year smoking history. She has never used smokeless tobacco. She reports that she does not drink alcohol and does not use drugs.  ROS: For pertinent review of systems please refer to history of present illness  Physical Exam: BP 125/81 (BP Location: Left Arm, Patient Position: Sitting, Cuff Size: Normal)   Pulse (!) 101   Ht 5\' 7"  (1.702 m)   Wt 209 lb 6.4 oz (95 kg)   LMP 08/13/2020   BMI 32.80 kg/m   Constitutional:  Well nourished. Alert and oriented, No acute distress. HEENT: [place] AT, mask in place.  Trachea midline, no masses. Cardiovascular: No clubbing, cyanosis, or edema. Respiratory: Normal respiratory effort,  no increased work of breathing. Neurologic: Grossly intact, no focal deficits, moving all 4 extremities. Psychiatric: Normal mood and affect.    Laboratory Data: Lab Results  Component Value Date   WBC 4.9 11/02/2019   HGB 13.5 11/02/2019   HCT 39.2 11/02/2019   MCV 86 11/02/2019   PLT 259 11/02/2019    Lab Results  Component Value Date   CREATININE 0.87 11/02/2019     Lab Results  Component Value Date   HGBA1C 5.0 11/02/2019    Lab Results  Component Value Date   TSH  1.100 01/13/2018       Component Value Date/Time   CHOL 204 (H) 11/21/2019 0834   HDL 29 (L) 11/21/2019 0834   CHOLHDL 7.0 (H) 11/21/2019 0834   LDLCALC 117 (H) 11/21/2019 0834    Lab Results  Component Value Date   AST 28 11/02/2019   Lab Results  Component Value Date   ALT 27 11/02/2019    Urinalysis Component     Latest Ref Rng & Units 08/29/2020  Specific Gravity, UA     1.005 - 1.030 1.025  pH, UA     5.0 - 7.5 6.5  Color, UA     Yellow Orange  Appearance Ur     Clear Hazy (A)  Leukocytes,UA     Negative Negative  Protein,UA     Negative/Trace Negative  Glucose, UA     Negative Negative  Ketones, UA     Negative Negative  RBC, UA     Negative Negative  Bilirubin, UA     Negative Negative  Urobilinogen, Ur     0.2 - 1.0 mg/dL 0.2  Nitrite, UA     Negative Negative  Microscopic Examination      See below:   Component     Latest Ref Rng & Units 08/29/2020  WBC, UA     0 - 5 /hpf 0-5  RBC     0 - 2 /hpf 0-2  Epithelial Cells (non renal)     0 - 10 /hpf 0-10  Mucus, UA     Not Estab. Present (A)  Bacteria, UA     None seen/Few Few     I have reviewed the labs.   Pertinent Imaging: CLINICAL DATA:  Kidney stone.  EXAM: ABDOMEN - 1 VIEW  COMPARISON:  August 23, 2020.  FINDINGS: The bowel gas pattern is normal. No radio-opaque calculi or other significant radiographic abnormality are seen.  IMPRESSION: Negative.   Electronically Signed    By: Lupita Raider M.D.   On: 08/30/2020 15:25 I have independently reviewed the films.  See HPI.   Assessment & Plan:    1. Right flank pain Stone spontaneously passed She will bring it in for analysis Given the ABCs of stone prevention  2. Microscopic hematuria Resolved, likely due to passage of stone  Return for Pending stone analysis.  These notes generated with voice recognition software. I apologize for typographical errors.  Michiel Cowboy, PA-C  [facility] [address]  [address] [place], [place] [address] [phone]

## 2020-08-29 ENCOUNTER — Ambulatory Visit
Admission: RE | Admit: 2020-08-29 | Discharge: 2020-08-29 | Disposition: A | Discharge status: Home / Self Care | Payer: BC Managed Care – PPO | Attending: Urology | Admitting: Urology

## 2020-08-29 ENCOUNTER — Ambulatory Visit
Admission: RE | Admit: 2020-08-29 | Discharge: 2020-08-29 | Disposition: A | Discharge status: Home / Self Care | Payer: BC Managed Care – PPO | Source: Ambulatory Visit | Attending: Urology | Admitting: Urology

## 2020-08-29 ENCOUNTER — Other Ambulatory Visit: Payer: Self-pay | Admitting: Family Medicine

## 2020-08-29 ENCOUNTER — Encounter: Payer: Self-pay | Admitting: Urology

## 2020-08-29 ENCOUNTER — Ambulatory Visit (INDEPENDENT_AMBULATORY_CARE_PROVIDER_SITE_OTHER): Payer: BC Managed Care – PPO | Admitting: Urology

## 2020-08-29 ENCOUNTER — Other Ambulatory Visit: Payer: Self-pay

## 2020-08-29 VITALS — BP 125/81 | HR 101 | Ht 67.0 in | Wt 209.4 lb

## 2020-08-29 DIAGNOSIS — Z87442 Personal history of urinary calculi: Secondary | ICD-10-CM | POA: Diagnosis not present

## 2020-08-29 DIAGNOSIS — N201 Calculus of ureter: Secondary | ICD-10-CM | POA: Insufficient documentation

## 2020-08-29 DIAGNOSIS — R109 Unspecified abdominal pain: Secondary | ICD-10-CM

## 2020-08-29 DIAGNOSIS — R3129 Other microscopic hematuria: Secondary | ICD-10-CM

## 2020-08-30 LAB — URINALYSIS, COMPLETE
Bilirubin, UA: NEGATIVE
Glucose, UA: NEGATIVE
Ketones, UA: NEGATIVE
Leukocytes,UA: NEGATIVE
Nitrite, UA: NEGATIVE
Protein,UA: NEGATIVE
RBC, UA: NEGATIVE
Specific Gravity, UA: 1.025 (ref 1.005–1.030)
Urobilinogen, Ur: 0.2 mg/dL (ref 0.2–1.0)
pH, UA: 6.5 (ref 5.0–7.5)

## 2020-08-30 LAB — MICROSCOPIC EXAMINATION

## 2020-09-07 ENCOUNTER — Telehealth: Payer: Self-pay

## 2020-09-07 NOTE — Telephone Encounter (Signed)
DOS 09/17/2020  EPF LT - 73220 GASTROCNEMIUS RECESS LT - 25427  [employer]BS EFFECTIVE DATE - 03/15/2020  PLAN DEDUCTIBLE - $500.00 W/ $79.54 REMAINING OUT OF POCKET - $3500.00 W/ $2505.47 REMAINING COPAY $0.00 COINSURANCE - 20%  NO AUTH REQUIRED PER WEBSITE

## 2020-09-17 ENCOUNTER — Encounter: Payer: BC Managed Care – PPO | Admitting: Podiatry

## 2020-09-17 DIAGNOSIS — M216X2 Other acquired deformities of left foot: Secondary | ICD-10-CM | POA: Diagnosis not present

## 2020-09-17 DIAGNOSIS — M62462 Contracture of muscle, left lower leg: Secondary | ICD-10-CM | POA: Diagnosis not present

## 2020-09-17 DIAGNOSIS — M25572 Pain in left ankle and joints of left foot: Secondary | ICD-10-CM | POA: Diagnosis not present

## 2020-09-17 DIAGNOSIS — M722 Plantar fascial fibromatosis: Secondary | ICD-10-CM | POA: Diagnosis not present

## 2020-09-17 DIAGNOSIS — K219 Gastro-esophageal reflux disease without esophagitis: Secondary | ICD-10-CM | POA: Diagnosis not present

## 2020-09-17 MED ORDER — ONDANSETRON HCL 4 MG PO TABS: 4.0000 mg | ORAL_TABLET | Freq: Three times a day (TID) | ORAL | 0 refills | Status: DC | PRN

## 2020-09-17 MED ORDER — IBUPROFEN 800 MG PO TABS: 800.0000 mg | ORAL_TABLET | Freq: Four times a day (QID) | ORAL | 1 refills | Status: DC | PRN

## 2020-09-17 MED ORDER — OXYCODONE-ACETAMINOPHEN 10-325 MG PO TABS: 1.0000 | ORAL_TABLET | ORAL | 0 refills | Status: DC | PRN

## 2020-09-17 NOTE — Addendum Note (Signed)
Addended by: Nicholes Rough on: 09/17/2020 07:23 AM   Modules accepted: Orders

## 2020-09-17 NOTE — Addendum Note (Signed)
Addended by: Nicholes Rough on: 09/17/2020 12:44 PM   Modules accepted: Orders

## 2020-09-18 ENCOUNTER — Encounter: Payer: Self-pay | Admitting: Podiatry

## 2020-09-18 ENCOUNTER — Ambulatory Visit: Payer: BC Managed Care – PPO | Admitting: Podiatry

## 2020-09-18 ENCOUNTER — Telehealth: Payer: Self-pay | Admitting: Podiatry

## 2020-09-18 ENCOUNTER — Other Ambulatory Visit: Payer: Self-pay

## 2020-09-18 DIAGNOSIS — Z9889 Other specified postprocedural states: Secondary | ICD-10-CM

## 2020-09-18 NOTE — Progress Notes (Signed)
Patient presents status post left foot surgery.  DOS: 07/18/2020.  She states that the dressings have bled through.  She presents today for dressing change and evaluation.  Objective: No active bleeding noted.  There is some strikethrough on the dressings.  Dressings were reinforced today.  The internal dressings were left intact.  Follow-up with Dr. Allena Katz on next scheduled appointment

## 2020-09-25 ENCOUNTER — Encounter: Payer: BC Managed Care – PPO | Admitting: Podiatry

## 2020-09-25 ENCOUNTER — Encounter: Payer: Self-pay | Admitting: Podiatry

## 2020-09-25 ENCOUNTER — Ambulatory Visit (INDEPENDENT_AMBULATORY_CARE_PROVIDER_SITE_OTHER): Payer: BC Managed Care – PPO | Admitting: Podiatry

## 2020-09-25 ENCOUNTER — Other Ambulatory Visit: Payer: Self-pay

## 2020-09-25 VITALS — BP 116/85 | HR 110 | Temp 99.3°F

## 2020-09-25 DIAGNOSIS — M722 Plantar fascial fibromatosis: Secondary | ICD-10-CM

## 2020-09-25 DIAGNOSIS — F411 Generalized anxiety disorder: Secondary | ICD-10-CM | POA: Diagnosis not present

## 2020-09-25 DIAGNOSIS — F410 Panic disorder [episodic paroxysmal anxiety] without agoraphobia: Secondary | ICD-10-CM | POA: Diagnosis not present

## 2020-09-25 DIAGNOSIS — M216X2 Other acquired deformities of left foot: Secondary | ICD-10-CM

## 2020-09-25 DIAGNOSIS — F330 Major depressive disorder, recurrent, mild: Secondary | ICD-10-CM | POA: Diagnosis not present

## 2020-09-25 DIAGNOSIS — Z9889 Other specified postprocedural states: Secondary | ICD-10-CM

## 2020-09-25 DIAGNOSIS — M21862 Other specified acquired deformities of left lower leg: Secondary | ICD-10-CM

## 2020-09-25 NOTE — Progress Notes (Signed)
Subjective:  Patient ID: Deborah Fields, female    DOB: [birth year],  MRN: [id number]  Chief Complaint  Patient presents with  . Routine Post Op    POV #1 DOS 09/17/2020 EPF LT, GASTROCENEMIUS RECESS LT     34 y.o. female returns for post-op check.  Patient states she is doing well.  The bandages clean dry and intact.  She has been ambulating with a cam boot on.  She denies any other acute complaints.  Review of Systems: Negative except as noted in the HPI. Denies N/V/F/Ch.  Past Medical History:  Diagnosis Date  . Allergic rhinitis   . Anemia    during pregnancy  . Anxiety    managed by Psych  . Asthma   . Depression    managed by psych  . Erosive esophagitis    seen on Colonoscopy  . GERD (gastroesophageal reflux disease)   . Headache   . Heartburn   . Hiatal hernia    shown on colonoscopy  . IBS (irritable bowel syndrome)    per colonoscopy in Feb 2015  . MRSA (methicillin resistant Staphylococcus aureus)   . Panic disorder without agoraphobia    managed by Psych  . Pneumonia 2016  . PONV (postoperative nausea and vomiting)     Current Outpatient Medications:  .  albuterol (PROAIR HFA) 108 (90 Base) MCG/ACT inhaler, Inhale 2 puffs into the lungs every 6 (six) hours as needed. Reported on 03/17/2016, Disp: 1 Inhaler, Rfl: 2 .  amoxicillin-clavulanate (AUGMENTIN) 875-125 MG tablet, Take 1 tablet by mouth every 12 (twelve) hours., Disp: 14 tablet, Rfl: 0 .  clonazePAM (KLONOPIN) 0.5 MG tablet, Take 0.5 mg by mouth 2 (two) times daily as needed., Disp: , Rfl: 1 .  DEXILANT 60 MG capsule, TAKE 1 CAPSULE BY MOUTH EVERY DAY, Disp: 90 capsule, Rfl: 1 .  DULoxetine (CYMBALTA) 60 MG capsule, Take 60 mg by mouth daily., Disp: , Rfl:  .  fluticasone (FLONASE) 50 MCG/ACT nasal spray, USE 2 SPRAYS IN BOTH NOSTRILS ONCE A DAY, Disp: 16 g, Rfl: 12 .  ibuprofen (ADVIL) 800 MG tablet, Take 1 tablet (800 mg total) by mouth every 6 (six) hours as needed., Disp: 60 tablet, Rfl: 1 .   meloxicam (MOBIC) 15 MG tablet, Take 1 tablet (15 mg total) by mouth daily., Disp: 30 tablet, Rfl: 3 .  minocycline (MINOCIN) 100 MG capsule, Take 100 mg by mouth 2 (two) times daily., Disp: , Rfl:  .  ondansetron (ZOFRAN) 4 MG tablet, Take 1 tablet (4 mg total) by mouth every 8 (eight) hours as needed for nausea or vomiting., Disp: 20 tablet, Rfl: 0 .  ondansetron (ZOFRAN) 4 MG tablet, Take 1 tablet (4 mg total) by mouth every 8 (eight) hours as needed for nausea or vomiting., Disp: 20 tablet, Rfl: 0 .  oxyCODONE-acetaminophen (PERCOCET) 10-325 MG tablet, Take 1 tablet by mouth every 4 (four) hours as needed for pain., Disp: 10 tablet, Rfl: 0 .  oxyCODONE-acetaminophen (PERCOCET) 10-325 MG tablet, Take 1 tablet by mouth every 4 (four) hours as needed for pain., Disp: 30 tablet, Rfl: 0 .  Probiotic Product (SOLUBLE FIBER/PROBIOTICS PO), Take 1 capsule at bedtime by mouth. , Disp: , Rfl:  .  sertraline (ZOLOFT) 100 MG tablet, Take 200 mg by mouth daily. , Disp: , Rfl: 3 .  tamsulosin (FLOMAX) 0.4 MG CAPS capsule, Take 1 capsule (0.4 mg total) by mouth daily., Disp: 30 capsule, Rfl: 0  Social History   Tobacco Use  Smoking Status Former Smoker  . Packs/day: 0.50  . Years: 5.00  . Pack years: 2.50  . Types: Cigarettes  Smokeless Tobacco Never Used  Tobacco Comment   quit 10 years    Allergies  Allergen Reactions  . Sulfa Antibiotics Rash   Objective:   Vitals:   09/25/20 1340  BP: 116/85  Pulse: (!) 110  Temp: 99.3 F (37.4 C)   There is no height or weight on file to calculate BMI. Constitutional Well developed. Well nourished.  Vascular Foot warm and well perfused. Capillary refill normal to all digits.   Neurologic Normal speech. Oriented to person, place, and time. Epicritic sensation to light touch grossly present bilaterally.  Dermatologic Skin healing well without signs of infection. Skin edges well coapted without signs of infection.  Orthopedic: Tenderness to  palpation noted about the surgical site.   Radiographs: None Assessment:   1. Status post left foot surgery   2. Plantar fasciitis of left foot   3. Gastrocnemius equinus of left lower extremity    Plan:  Patient was evaluated and treated and all questions answered.  S/p foot surgery left -Progressing as expected post-operatively. -XR: None -WB Status: Weightbearing as tolerated in a cam boot -Sutures: Intact.  No signs of dehiscence noted.  No clinical signs of infection noted. -Medications: None -Foot redressed.  No follow-ups on file.

## 2020-10-02 ENCOUNTER — Encounter: Payer: Self-pay | Admitting: Podiatry

## 2020-10-02 ENCOUNTER — Ambulatory Visit (INDEPENDENT_AMBULATORY_CARE_PROVIDER_SITE_OTHER): Payer: BC Managed Care – PPO | Admitting: Podiatry

## 2020-10-02 ENCOUNTER — Other Ambulatory Visit: Payer: Self-pay

## 2020-10-02 DIAGNOSIS — M722 Plantar fascial fibromatosis: Secondary | ICD-10-CM

## 2020-10-02 DIAGNOSIS — Z9889 Other specified postprocedural states: Secondary | ICD-10-CM

## 2020-10-02 DIAGNOSIS — M21862 Other specified acquired deformities of left lower leg: Secondary | ICD-10-CM

## 2020-10-02 DIAGNOSIS — M216X2 Other acquired deformities of left foot: Secondary | ICD-10-CM

## 2020-10-02 NOTE — Progress Notes (Signed)
Subjective:  Patient ID: Deborah Fields, female    DOB: Feb 15, 1986,  MRN: [id number]  Chief Complaint  Patient presents with  . Routine Post Op     POV #2 DOS 09/17/2020 EPF LT, GASTROCENEMIUS RECESS LT     34 y.o. female returns for post-op check.  Patient states she is doing well.  The bandages clean dry and intact.  She has been ambulating with a cam boot on.  She denies any other acute complaints.  Review of Systems: Negative except as noted in the HPI. Denies N/V/F/Ch.  Past Medical History:  Diagnosis Date  . Allergic rhinitis   . Anemia    during pregnancy  . Anxiety    managed by Psych  . Asthma   . Depression    managed by psych  . Erosive esophagitis    seen on Colonoscopy  . GERD (gastroesophageal reflux disease)   . Headache   . Heartburn   . Hiatal hernia    shown on colonoscopy  . IBS (irritable bowel syndrome)    per colonoscopy in Feb 2015  . MRSA (methicillin resistant Staphylococcus aureus)   . Panic disorder without agoraphobia    managed by Psych  . Pneumonia 2016  . PONV (postoperative nausea and vomiting)     Current Outpatient Medications:  .  albuterol (PROAIR HFA) 108 (90 Base) MCG/ACT inhaler, Inhale 2 puffs into the lungs every 6 (six) hours as needed. Reported on 03/17/2016, Disp: 1 Inhaler, Rfl: 2 .  amoxicillin-clavulanate (AUGMENTIN) 875-125 MG tablet, Take 1 tablet by mouth every 12 (twelve) hours., Disp: 14 tablet, Rfl: 0 .  clonazePAM (KLONOPIN) 0.5 MG tablet, Take 0.5 mg by mouth 2 (two) times daily as needed., Disp: , Rfl: 1 .  DEXILANT 60 MG capsule, TAKE 1 CAPSULE BY MOUTH EVERY DAY, Disp: 90 capsule, Rfl: 1 .  DULoxetine (CYMBALTA) 60 MG capsule, Take 60 mg by mouth daily., Disp: , Rfl:  .  fluticasone (FLONASE) 50 MCG/ACT nasal spray, USE 2 SPRAYS IN BOTH NOSTRILS ONCE A DAY, Disp: 16 g, Rfl: 12 .  ibuprofen (ADVIL) 800 MG tablet, Take 1 tablet (800 mg total) by mouth every 6 (six) hours as needed., Disp: 60 tablet, Rfl: 1 .   meloxicam (MOBIC) 15 MG tablet, Take 1 tablet (15 mg total) by mouth daily., Disp: 30 tablet, Rfl: 3 .  minocycline (MINOCIN) 100 MG capsule, Take 100 mg by mouth 2 (two) times daily., Disp: , Rfl:  .  ondansetron (ZOFRAN) 4 MG tablet, Take 1 tablet (4 mg total) by mouth every 8 (eight) hours as needed for nausea or vomiting., Disp: 20 tablet, Rfl: 0 .  ondansetron (ZOFRAN) 4 MG tablet, Take 1 tablet (4 mg total) by mouth every 8 (eight) hours as needed for nausea or vomiting., Disp: 20 tablet, Rfl: 0 .  oxyCODONE-acetaminophen (PERCOCET) 10-325 MG tablet, Take 1 tablet by mouth every 4 (four) hours as needed for pain., Disp: 10 tablet, Rfl: 0 .  oxyCODONE-acetaminophen (PERCOCET) 10-325 MG tablet, Take 1 tablet by mouth every 4 (four) hours as needed for pain., Disp: 30 tablet, Rfl: 0 .  Probiotic Product (SOLUBLE FIBER/PROBIOTICS PO), Take 1 capsule at bedtime by mouth. , Disp: , Rfl:  .  sertraline (ZOLOFT) 100 MG tablet, Take 200 mg by mouth daily. , Disp: , Rfl: 3 .  tamsulosin (FLOMAX) 0.4 MG CAPS capsule, Take 1 capsule (0.4 mg total) by mouth daily., Disp: 30 capsule, Rfl: 0  Social History   Tobacco Use  Smoking Status Former Smoker  . Packs/day: 0.50  . Years: 5.00  . Pack years: 2.50  . Types: Cigarettes  Smokeless Tobacco Never Used  Tobacco Comment   quit 10 years    Allergies  Allergen Reactions  . Sulfa Antibiotics Rash   Objective:   There were no vitals filed for this visit. There is no height or weight on file to calculate BMI. Constitutional Well developed. Well nourished.  Vascular Foot warm and well perfused. Capillary refill normal to all digits.   Neurologic Normal speech. Oriented to person, place, and time. Epicritic sensation to light touch grossly present bilaterally.  Dermatologic  skin completely reepithelialized.  No signs of dehiscence or infection noted.  Good range of motion noted at the ankle joint with improvement of the dorsiflexion 10 degrees  past.  Orthopedic: Tenderness to palpation noted about the surgical site.   Radiographs: None Assessment:   1. Status post left foot surgery   2. Plantar fasciitis of left foot   3. Gastrocnemius equinus of left lower extremity    Plan:  Patient was evaluated and treated and all questions answered.  S/p foot surgery left -Progressing as expected post-operatively. -XR: None -WB Status: Weightbearing as tolerated in a cam boot -Sutures: Removed.  No dehiscence noted.  No clinical signs of infection noted. -Medications: None -Foot redressed. -Encourage patient to try to ambulate into regular shoes.  Surgical shoe was dispensed to help with the transition process.  Patient states understanding -I will discuss physical therapy if there is no improvement.  No follow-ups on file.

## 2020-10-04 DIAGNOSIS — Z20822 Contact with and (suspected) exposure to covid-19: Secondary | ICD-10-CM | POA: Diagnosis not present

## 2020-10-09 ENCOUNTER — Encounter: Payer: BC Managed Care – PPO | Admitting: Podiatry

## 2020-10-16 ENCOUNTER — Encounter: Payer: Self-pay | Admitting: Podiatry

## 2020-10-16 ENCOUNTER — Ambulatory Visit (INDEPENDENT_AMBULATORY_CARE_PROVIDER_SITE_OTHER): Payer: BC Managed Care – PPO | Admitting: Podiatry

## 2020-10-16 ENCOUNTER — Other Ambulatory Visit: Payer: Self-pay

## 2020-10-16 DIAGNOSIS — M216X2 Other acquired deformities of left foot: Secondary | ICD-10-CM

## 2020-10-16 DIAGNOSIS — Z9889 Other specified postprocedural states: Secondary | ICD-10-CM

## 2020-10-16 DIAGNOSIS — M722 Plantar fascial fibromatosis: Secondary | ICD-10-CM

## 2020-10-16 DIAGNOSIS — M21862 Other specified acquired deformities of left lower leg: Secondary | ICD-10-CM

## 2020-10-16 NOTE — Progress Notes (Signed)
Subjective:  Patient ID: Deborah Fields, female    DOB: [birth year],  MRN: [id number]  Chief Complaint  Patient presents with  . Routine Post Op     POV #3 DOS 09/17/2020 EPF LT, GASTROCENEMIUS RECESS LT  "its getting better, but still have alot of pain in my heel"     34 y.o. female returns for post-op check.  Patient states she is doing well.  The bandages clean dry and intact.  She has been ambulating with a cam boot on.  She denies any other acute complaints.  Review of Systems: Negative except as noted in the HPI. Denies N/V/F/Ch.  Past Medical History:  Diagnosis Date  . Allergic rhinitis   . Anemia    during pregnancy  . Anxiety    managed by Psych  . Asthma   . Depression    managed by psych  . Erosive esophagitis    seen on Colonoscopy  . GERD (gastroesophageal reflux disease)   . Headache   . Heartburn   . Hiatal hernia    shown on colonoscopy  . IBS (irritable bowel syndrome)    per colonoscopy in Feb 2015  . MRSA (methicillin resistant Staphylococcus aureus)   . Panic disorder without agoraphobia    managed by Psych  . Pneumonia 2016  . PONV (postoperative nausea and vomiting)     Current Outpatient Medications:  .  albuterol (PROAIR HFA) 108 (90 Base) MCG/ACT inhaler, Inhale 2 puffs into the lungs every 6 (six) hours as needed. Reported on 03/17/2016, Disp: 1 Inhaler, Rfl: 2 .  amoxicillin-clavulanate (AUGMENTIN) 875-125 MG tablet, Take 1 tablet by mouth every 12 (twelve) hours., Disp: 14 tablet, Rfl: 0 .  clonazePAM (KLONOPIN) 0.5 MG tablet, Take 0.5 mg by mouth 2 (two) times daily as needed., Disp: , Rfl: 1 .  DEXILANT 60 MG capsule, TAKE 1 CAPSULE BY MOUTH EVERY DAY, Disp: 90 capsule, Rfl: 1 .  DULoxetine (CYMBALTA) 60 MG capsule, Take 60 mg by mouth daily., Disp: , Rfl:  .  fluticasone (FLONASE) 50 MCG/ACT nasal spray, USE 2 SPRAYS IN BOTH NOSTRILS ONCE A DAY, Disp: 16 g, Rfl: 12 .  ibuprofen (ADVIL) 800 MG tablet, Take 1 tablet (800 mg total) by mouth  every 6 (six) hours as needed., Disp: 60 tablet, Rfl: 1 .  meloxicam (MOBIC) 15 MG tablet, Take 1 tablet (15 mg total) by mouth daily., Disp: 30 tablet, Rfl: 3 .  minocycline (MINOCIN) 100 MG capsule, Take 100 mg by mouth 2 (two) times daily., Disp: , Rfl:  .  ondansetron (ZOFRAN) 4 MG tablet, Take 1 tablet (4 mg total) by mouth every 8 (eight) hours as needed for nausea or vomiting., Disp: 20 tablet, Rfl: 0 .  ondansetron (ZOFRAN) 4 MG tablet, Take 1 tablet (4 mg total) by mouth every 8 (eight) hours as needed for nausea or vomiting., Disp: 20 tablet, Rfl: 0 .  oxyCODONE-acetaminophen (PERCOCET) 10-325 MG tablet, Take 1 tablet by mouth every 4 (four) hours as needed for pain., Disp: 10 tablet, Rfl: 0 .  oxyCODONE-acetaminophen (PERCOCET) 10-325 MG tablet, Take 1 tablet by mouth every 4 (four) hours as needed for pain., Disp: 30 tablet, Rfl: 0 .  Probiotic Product (SOLUBLE FIBER/PROBIOTICS PO), Take 1 capsule at bedtime by mouth. , Disp: , Rfl:  .  sertraline (ZOLOFT) 100 MG tablet, Take 200 mg by mouth daily. , Disp: , Rfl: 3 .  tamsulosin (FLOMAX) 0.4 MG CAPS capsule, Take 1 capsule (0.4 mg total) by mouth  daily., Disp: 30 capsule, Rfl: 0  Social History   Tobacco Use  Smoking Status Former Smoker  . Packs/day: 0.50  . Years: 5.00  . Pack years: 2.50  . Types: Cigarettes  Smokeless Tobacco Never Used  Tobacco Comment   quit 10 years    Allergies  Allergen Reactions  . Sulfa Antibiotics Rash   Objective:   There were no vitals filed for this visit. There is no height or weight on file to calculate BMI. Constitutional Well developed. Well nourished.  Vascular Foot warm and well perfused. Capillary refill normal to all digits.   Neurologic Normal speech. Oriented to person, place, and time. Epicritic sensation to light touch grossly present bilaterally.  Dermatologic  skin completely reepithelialized.  No signs of dehiscence or infection noted.  Good range of motion noted at the  ankle joint with improvement of the dorsiflexion 10 degrees past.  Orthopedic: Tenderness to palpation noted about the surgical site.   Radiographs: None Assessment:   1. Status post left foot surgery   2. Plantar fasciitis of left foot   3. Gastrocnemius equinus of left lower extremity    Plan:  Patient was evaluated and treated and all questions answered.  S/p foot surgery left -Progressing as expected post-operatively. -XR: None -WB Status: Weightbearing as tolerated in a cam boot -Sutures: Removed.  No dehiscence noted.  No clinical signs of infection noted. -Medications: None -Foot redressed. -Encourage patient to start transitioning into regular shoes and physical therapy can help with the transition as well. -Prescription for physical therapy was given to the patient.  No follow-ups on file.

## 2020-11-29 DIAGNOSIS — Z20822 Contact with and (suspected) exposure to covid-19: Secondary | ICD-10-CM | POA: Diagnosis not present

## 2020-12-02 DIAGNOSIS — J0100 Acute maxillary sinusitis, unspecified: Secondary | ICD-10-CM | POA: Diagnosis not present

## 2020-12-02 DIAGNOSIS — Z23 Encounter for immunization: Secondary | ICD-10-CM | POA: Diagnosis not present

## 2020-12-02 DIAGNOSIS — R5383 Other fatigue: Secondary | ICD-10-CM | POA: Diagnosis not present

## 2020-12-02 DIAGNOSIS — Z20822 Contact with and (suspected) exposure to covid-19: Secondary | ICD-10-CM | POA: Diagnosis not present

## 2020-12-02 DIAGNOSIS — Z03818 Encounter for observation for suspected exposure to other biological agents ruled out: Secondary | ICD-10-CM | POA: Diagnosis not present

## 2020-12-02 DIAGNOSIS — J069 Acute upper respiratory infection, unspecified: Secondary | ICD-10-CM | POA: Diagnosis not present

## 2020-12-05 DIAGNOSIS — E782 Mixed hyperlipidemia: Secondary | ICD-10-CM | POA: Diagnosis not present

## 2020-12-05 DIAGNOSIS — B349 Viral infection, unspecified: Secondary | ICD-10-CM | POA: Diagnosis not present

## 2020-12-05 DIAGNOSIS — K219 Gastro-esophageal reflux disease without esophagitis: Secondary | ICD-10-CM | POA: Diagnosis not present

## 2020-12-05 DIAGNOSIS — Z1159 Encounter for screening for other viral diseases: Secondary | ICD-10-CM | POA: Diagnosis not present

## 2020-12-06 DIAGNOSIS — Z1159 Encounter for screening for other viral diseases: Secondary | ICD-10-CM | POA: Diagnosis not present

## 2020-12-24 ENCOUNTER — Encounter: Payer: Self-pay | Admitting: Podiatry

## 2021-01-17 ENCOUNTER — Other Ambulatory Visit: Payer: Self-pay | Admitting: Gastroenterology

## 2021-01-23 ENCOUNTER — Ambulatory Visit (INDEPENDENT_AMBULATORY_CARE_PROVIDER_SITE_OTHER): Payer: BC Managed Care – PPO | Admitting: Obstetrics and Gynecology

## 2021-01-23 ENCOUNTER — Other Ambulatory Visit: Payer: Self-pay

## 2021-01-23 ENCOUNTER — Encounter: Payer: Self-pay | Admitting: Obstetrics and Gynecology

## 2021-01-23 VITALS — BP 91/64 | HR 109 | Ht 67.0 in | Wt 204.2 lb

## 2021-01-23 DIAGNOSIS — B9689 Other specified bacterial agents as the cause of diseases classified elsewhere: Secondary | ICD-10-CM | POA: Diagnosis not present

## 2021-01-23 DIAGNOSIS — N760 Acute vaginitis: Secondary | ICD-10-CM | POA: Diagnosis not present

## 2021-01-23 MED ORDER — METRONIDAZOLE 500 MG PO TABS: 500.0000 mg | ORAL_TABLET | Freq: Two times a day (BID) | ORAL | 0 refills | Status: DC

## 2021-01-23 NOTE — Progress Notes (Signed)
   GYNECOLOGY CLINIC  Subjective:     Deborah Fields is a 35 y.o. [id number] female who presents for evaluation of an abnormal vaginal discharge. Symptoms have been present for 4 days. Vaginal symptoms: discharge described as white and milky, local irritation and odor, itching.  She denies abnormal bleeding and bumps.   The following portions of the patient's history were reviewed and updated as appropriate: allergies, current medications, past family history, past medical history, past social history, past surgical history and problem list.   Review of Systems Pertinent items noted in HPI and remainder of comprehensive ROS otherwise negative.    Objective:    BP 91/64   Pulse (!) 109   Ht 5\' 7"  (1.702 m)   Wt 204 lb 3.2 oz (92.6 kg)   LMP 01/11/2021   BMI 31.98 kg/m  General appearance: alert and no distress Abdomen: soft, non-tender; bowel sounds normal; no masses,  no organomegaly Pelvic: external genitalia normal, rectovaginal septum normal.  Vagina without discharge.  Cervix normal appearing, no lesions and no motion tenderness.  Uterus mobile, nontender, normal shape and size.  Adnexae non-palpable, nontender bilaterally.  Extremities: extremities normal, atraumatic, no cyanosis or edema    Neurologic: grossly normal.                                                                                                                                                               Microscopic wet-mount exam shows moderate clue cells, no hyphae, no trichomonads, few white blood cells. KOH done.      Assessment:    Bacterial vaginosis.   Plan:    Symptomatic local care discussed. Oral antibiotics see orders.     01/13/2021, MD [facility]

## 2021-01-23 NOTE — Progress Notes (Signed)
Pt present for possible yeast infection. Pt stated that she is having vaginal itching/pain and mild discharge with odor. Pt stated this is day 4 of the symptoms.

## 2021-01-24 ENCOUNTER — Encounter: Payer: Self-pay | Admitting: Obstetrics and Gynecology

## 2021-01-24 NOTE — Patient Instructions (Signed)
Bacterial Vaginosis  Bacterial vaginosis is an infection of the vagina. It happens when too many normal germs (healthy bacteria) grow in the vagina. This infection can make it easier to get other infections from sex (STIs). It is very important for pregnant women to get treated. This infection can cause babies to be born early or at a low birth weight. What are the causes? This infection is caused by an increase in certain germs that grow in the vagina. You cannot get this infection from toilet seats, bedsheets, swimming pools, or things that touch your vagina. What increases the risk?  Having sex with a new person or more than one person.  Having sex without protection.  Douching.  Having an intrauterine device (IUD).  Smoking.  Using drugs or drinking alcohol. These can lead you to do things that are risky.  Taking certain antibiotic medicines.  Being pregnant. What are the signs or symptoms? Some women have no symptoms. Symptoms may include:  A discharge from your vagina. It may be gray or white. It can be watery or foamy.  A fishy smell. This can happen after sex or during your menstrual period.  Itching in and around your vagina.  A feeling of burning or pain when you pee (urinate). How is this treated? This infection is treated with antibiotic medicines. These may be given to you as:  A pill.  A cream for your vagina.  A medicine that you put into your vagina (suppository). If the infection comes back after treatment, you may need more antibiotics. Follow these instructions at home: Medicines  Take over-the-counter and prescription medicines as told by your doctor.  Take or use your antibiotic medicine as told by your doctor. Do not stop taking or using it, even if you start to feel better. General instructions  If the person you have sex with is a woman, tell her that you have this infection. She will need to follow up with her doctor. If you have a female  partner, he does not need to be treated.  Do not have sex until you finish treatment.  Drink enough fluid to keep your pee pale yellow.  Keep your vagina and butt clean. ? Wash the area with warm water each day. ? Wipe from front to back after you use the toilet.  If you are breastfeeding a baby, ask your doctor if you should keep doing so during treatment.  Keep all follow-up visits. How is this prevented? Self-care  Do not douche.  Use only warm water to wash around your vagina.  Wear underwear that is cotton or lined with cotton.  Do not wear tight pants and pantyhose, especially in the summer. Safe sex  Use protection when you have sex. This includes: ? Use condoms. ? Use dental dams. This is a thin layer that protects the mouth during oral sex.  Limit how many people you have sex with. To prevent this infection, it is best to have sex with just one person.  Get tested for STIs. The person you have sex with should also get tested. Drugs and alcohol  Do not smoke or use any products that contain nicotine or tobacco. If you need help quitting, ask your doctor.  Do not use drugs.  Do not drink alcohol if: ? Your doctor tells you not to drink. ? You are pregnant, may be pregnant, or are planning to become pregnant.  If you drink alcohol: ? Limit how much you have to 0-1 drink   a day. ? Know how much alcohol is in your drink. In the [place] one drink equals one 12 oz bottle of beer (355 mL), one 5 oz glass of wine (148 mL), or one 1 oz glass of hard liquor (44 mL). Where to find more information  Centers for Disease Control and Prevention: www.cdc.gov  American Sexual Health Association: www.ashastd.org  Office on Women's Health: www.womenshealth.gov Contact a doctor if:  Your symptoms do not get better, even after you are treated.  You have more discharge or pain when you pee.  You have a fever or chills.  You have pain in your belly (abdomen) or in the area  between your hips.  You have pain with sex.  You bleed from your vagina between menstrual periods. Summary  This infection can happen when too many germs (bacteria) grow in the vagina.  This infection can make it easier to get infections from sex (STIs). Treating this can lower that chance.  Get treated if you are pregnant. This infection can cause babies to be born early.  Do not stop taking or using your antibiotic medicine, even if you start to feel better. This information is not intended to replace advice given to you by your health care provider. Make sure you discuss any questions you have with your health care provider. Document Revised: 05/31/2020 Document Reviewed: 05/31/2020 Elsevier Patient Education  2021 Elsevier Inc.  

## 2021-02-14 ENCOUNTER — Encounter: Payer: BC Managed Care – PPO | Admitting: Obstetrics and Gynecology

## 2021-02-20 ENCOUNTER — Ambulatory Visit
Admission: RE | Admit: 2021-02-20 | Discharge: 2021-02-20 | Disposition: A | Discharge status: Home / Self Care | Payer: BC Managed Care – PPO | Source: Ambulatory Visit | Attending: Urology | Admitting: Urology

## 2021-02-20 ENCOUNTER — Encounter: Payer: Self-pay | Admitting: Urology

## 2021-02-20 ENCOUNTER — Other Ambulatory Visit
Admission: RE | Admit: 2021-02-20 | Discharge: 2021-02-20 | Disposition: A | Discharge status: Home / Self Care | Payer: BC Managed Care – PPO | Source: Ambulatory Visit | Attending: Urology | Admitting: Urology

## 2021-02-20 ENCOUNTER — Ambulatory Visit
Admission: RE | Admit: 2021-02-20 | Discharge: 2021-02-20 | Disposition: A | Discharge status: Home / Self Care | Payer: BC Managed Care – PPO | Attending: Urology | Admitting: Urology

## 2021-02-20 ENCOUNTER — Other Ambulatory Visit: Payer: Self-pay | Admitting: Family Medicine

## 2021-02-20 ENCOUNTER — Ambulatory Visit: Payer: BC Managed Care – PPO | Admitting: Urology

## 2021-02-20 ENCOUNTER — Other Ambulatory Visit: Payer: Self-pay

## 2021-02-20 VITALS — BP 99/64 | HR 102 | Temp 98.3°F | Ht 67.0 in | Wt 199.0 lb

## 2021-02-20 DIAGNOSIS — R109 Unspecified abdominal pain: Secondary | ICD-10-CM | POA: Insufficient documentation

## 2021-02-20 DIAGNOSIS — R110 Nausea: Secondary | ICD-10-CM

## 2021-02-20 DIAGNOSIS — N201 Calculus of ureter: Secondary | ICD-10-CM | POA: Diagnosis present

## 2021-02-20 DIAGNOSIS — R350 Frequency of micturition: Secondary | ICD-10-CM | POA: Diagnosis not present

## 2021-02-20 DIAGNOSIS — R10A0 Flank pain, unspecified side: Secondary | ICD-10-CM

## 2021-02-20 LAB — PREGNANCY, URINE: Preg Test, Ur: NEGATIVE

## 2021-02-20 MED ORDER — CEPHALEXIN 500 MG PO CAPS: 500.0000 mg | ORAL_CAPSULE | Freq: Two times a day (BID) | ORAL | 0 refills | Status: DC

## 2021-02-20 MED ORDER — TAMSULOSIN HCL 0.4 MG PO CAPS: 0.4000 mg | ORAL_CAPSULE | Freq: Every day | ORAL | 0 refills | Status: DC

## 2021-02-20 NOTE — Progress Notes (Signed)
02/20/2021 11:22 AM   Deborah Fields 06/05/1986 [id number]  Referring provider: Charlynne Cousins, MD [address] [place],  [place] [address]  Chief Complaint  Patient presents with  . Flank Pain    Stone?   Urological history: 1. Nephrolithiasis - 08/2020 KUB notes a 4 mm radiopaque calcification located at the level of L3 suspicious for ureteral calculi.  She passed a stone that evening.    2. rUTI's - CT renal stone study in February 2017 was NED.  Cystoscopy in March 2017 was NED.  - risk factors of IBS   HPI: Deborah Fields is a 35 y.o. female who presents today for right sided flank pain associated with frequency, nausea and dizziness.  She states the dizziness started yesterday and on the right flank pain, frequency and nausea started this morning.  Patient denies any modifying or aggravating factors.  Patient denies any gross hematuria, dysuria or suprapubic/flank pain.  Patient denies any fevers, chills or vomiting.   UA 6-10 WBCs and many bacteria.  KUB shows a radiopaque calcification at the level of L4 on the right.  STAT CT today did not demonstrate any hydronephrosis, ureteral or kidney stones.  They did note splenomegaly and I noted a large stool burden in the right colon.   PMH: Past Medical History:  Diagnosis Date  . Allergic rhinitis   . Anemia    during pregnancy  . Anxiety    managed by Psych  . Asthma   . Depression    managed by psych  . Erosive esophagitis    seen on Colonoscopy  . GERD (gastroesophageal reflux disease)   . Headache   . Heartburn   . Hiatal hernia    shown on colonoscopy  . IBS (irritable bowel syndrome)    per colonoscopy in Feb 2015  . MRSA (methicillin resistant Staphylococcus aureus)   . Panic disorder without agoraphobia    managed by Psych  . Pneumonia 2016  . PONV (postoperative nausea and vomiting)     Surgical History: Past Surgical History:  Procedure Laterality Date  . CESAREAN SECTION    . CESAREAN  SECTION WITH BILATERAL TUBAL LIGATION Bilateral 10/30/2017   Procedure: CESAREAN SECTION WITH BILATERAL TUBAL LIGATION;  Surgeon: Rubie Maid, MD;  Location: [facility] ORS;  Service: Obstetrics;  Laterality: Bilateral;  . COLONOSCOPY  Feb 2015   showed IBS, Hiatal Hernia, Erosive reflux esophagitis  . ESOPHAGOGASTRODUODENOSCOPY    . OVARIAN CYST REMOVAL Right   . TONSILLECTOMY    . TONSILLECTOMY      Home Medications:  Allergies as of 02/20/2021      Reactions   Sulfa Antibiotics Rash      Medication List       Accurate as of February 20, 2021 11:59 PM. If you have any questions, ask your nurse or doctor.        cephALEXin 500 MG capsule Commonly known as: Keflex Take 1 capsule (500 mg total) by mouth 2 (two) times daily. Started by: Zara Council, PA-C   clonazePAM 0.5 MG tablet Commonly known as: KLONOPIN Take 0.5 mg by mouth 2 (two) times daily as needed.   Dexilant 60 MG capsule Generic drug: dexlansoprazole TAKE 1 CAPSULE BY MOUTH EVERY DAY   DULoxetine 60 MG capsule Commonly known as: CYMBALTA Take 60 mg by mouth daily.   fenofibrate 160 MG tablet Take 160 mg by mouth daily.   fluticasone 50 MCG/ACT nasal spray Commonly known as: FLONASE USE 2 SPRAYS  IN BOTH NOSTRILS ONCE A DAY   metroNIDAZOLE 500 MG tablet Commonly known as: FLAGYL Take 1 tablet (500 mg total) by mouth 2 (two) times daily.   sertraline 100 MG tablet Commonly known as: ZOLOFT Take 200 mg by mouth daily.   SOLUBLE FIBER/PROBIOTICS PO Take 1 capsule at bedtime by mouth.   tamsulosin 0.4 MG Caps capsule Commonly known as: FLOMAX Take 1 capsule (0.4 mg total) by mouth daily. Started by: Zara Council, PA-C       Allergies:  Allergies  Allergen Reactions  . Sulfa Antibiotics Rash    Family History: Family History  Problem Relation Age of Onset  . Diabetes Mother   . Hypertension Mother   . Hyperlipidemia Mother   . COPD Mother   . Heart disease Father   . Hypertension Father    . Hyperlipidemia Father   . COPD Maternal Grandmother   . COPD Maternal Grandfather   . Cancer Paternal Grandfather        prostate  . Stroke Neg Hx   . Kidney disease Neg Hx   . Bladder Cancer Neg Hx     Social History:  reports that she has quit smoking. Her smoking use included cigarettes. She has a 2.50 pack-year smoking history. She has never used smokeless tobacco. She reports that she does not drink alcohol and does not use drugs.  ROS: For pertinent review of systems please refer to history of present illness  Physical Exam: BP 99/64   Pulse (!) 102   Temp 98.3 F (36.8 C)   Ht _0  (1.702 m)   Wt 199 lb (90.3 kg)   SpO2 98%   BMI 31.17 kg/m   Constitutional:  Well nourished. Alert and oriented, No acute distress. HEENT: [place] AT, mask in place.  Trachea midline Cardiovascular: No clubbing, cyanosis, or edema. Respiratory: Normal respiratory effort, no increased work of breathing. Neurologic: Grossly intact, no focal deficits, moving all 4 extremities. Psychiatric: Normal mood and affect.   Laboratory Data: Specimen:  Blood  Ref Range & Units 1 mo ago  Protein, Total 6.1 - 7.9 g/dL 6.9   Albumin 3.5 - 4.8 g/dL 4.6   Bilirubin, Total 0.3 - 1.2 mg/dL 0.4   Bilirubin, Conjugated 0.00 - 0.20 mg/dL 0.08   Alk Phos (alkaline Phosphatase) 34 - 104 U/L 59   AST  8 - 39 U/L 25   ALT  5 - 38 U/L 31   Resulting Agency  [place] - [facility]  Specimen Collected: 12/25/20 1:31 PM Last Resulted: 12/25/20 3:26 PM  Received From: [place]  Result Received: 12/31/20 4:52 PM   Specimen:  Blood  Ref Range & Units 2 mo ago  Glucose 70 - 110 mg/dL 87   Sodium 136 - 145 mmol/L 139   Potassium 3.6 - 5.1 mmol/L 3.9   Chloride 97 - 109 mmol/L 102   Carbon Dioxide (CO2) 22.0 - 32.0 mmol/L 29.6   Urea Nitrogen (BUN) 7 - 25 mg/dL 14   Creatinine 0.6 - 1.1 mg/dL 0.8   Glomerular Filtration Rate (eGFR), MDRD Estimate >60 mL/min/1.73sq m 81   Calcium 8.7  - 10.3 mg/dL 9.3   AST  8 - 39 U/L 49High   ALT  5 - 38 U/L 75High   Alk Phos (alkaline Phosphatase) 34 - 104 U/L 82   Albumin 3.5 - 4.8 g/dL 4.5   Bilirubin, Total 0.3 - 1.2 mg/dL 0.5   Protein, Total 6.1 - 7.9 g/dL  7.3   A/G Ratio 1.0 - 5.0 gm/dL 1.6   Resulting Agency  [facility] - [facility]  Specimen Collected: 12/06/20 8:05 AM Last Resulted: 12/06/20 11:53 AM  Received From: [place]  Result Received: 12/25/20 3:14 PM   Specimen:  Blood  Ref Range & Units 2 mo ago Comments  Cholesterol, Total 100 - 200 mg/dL 245High    Triglyceride 35 - 199 mg/dL 431High  Calculated LDL and VLDL not reported due to Triglyceride >400 mg/dL.  HDL (High Density Lipoprotein) Cholesterol 35.0 - 85.0 mg/dL 35.3    Cholesterol/HDL Ratio  6.9    Resulting Agency  [place] - LAB   Specimen Collected: 12/06/20 8:05 AM Last Resulted: 12/06/20 11:53 AM  Received From: [place]  Result Received: 12/25/20 3:14 PM   Specimen:  Blood  Ref Range & Units 2 mo ago  WBC (White Blood Cell Count) 4.1 - 10.2 10^3/uL 6.3   RBC (Red Blood Cell Count) 4.04 - 5.48 10^6/uL 4.60   Hemoglobin 12.0 - 15.0 gm/dL 13.5   Hematocrit 35.0 - 47.0 % 39.5   MCV (Mean Corpuscular Volume) 80.0 - 100.0 fl 85.9   MCH (Mean Corpuscular Hemoglobin) 27.0 - 31.2 pg 29.3   MCHC (Mean Corpuscular Hemoglobin Concentration) 32.0 - 36.0 gm/dL 34.2   Platelet Count 150 - 450 10^3/uL 238   RDW-CV (Red Cell Distribution Width) 11.6 - 14.8 % 12.0   MPV (Mean Platelet Volume) 9.4 - 12.4 fl 8.6Low   Neutrophils 1.50 - 7.80 10^3/uL 3.55   Lymphocytes 1.00 - 3.60 10^3/uL 1.88   Monocytes 0.00 - 1.50 10^3/uL 0.38   Eosinophils 0.00 - 0.55 10^3/uL 0.40   Basophils 0.00 - 0.09 10^3/uL 0.05   Neutrophil % 32.0 - 70.0 % 56.5   Lymphocyte % 10.0 - 50.0 % 29.9   Monocyte % 4.0 - 13.0 % 6.1   Eosinophil % 1.0 - 5.0 % 6.4High   Basophil% 0.0 - 2.0 % 0.8   Immature Granulocyte %  <=0.7 % 0.3   Immature Granulocyte Count <=0.06 10^3/L 0.02   Resulting Agency  [place] - LAB  Specimen Collected: 12/06/20 8:05 AM Last Resulted: 12/06/20 9:38 AM  Received From: [place]  Result Received: 12/25/20 3:14 PM   Specimen:  Blood  Ref Range & Units 2 mo ago  Hemoglobin A1C 4.2 - 5.6 % 5.0   Average Blood Glucose (Calc) mg/dL 97   Resulting Agency  [place] - LAB   Narrative Performed by [facility] - LAB Normal Range:  4.2 - 5.6%  Increased Risk: 5.7 - 6.4%  Diabetes:    >= 6.5%  Glycemic Control for adults with diabetes: <7%  Specimen Collected: 12/06/20 8:05 AM Last Resulted: 12/06/20 12:48 PM  Received From: [place]  Result Received: 12/25/20 3:14 PM    Urinalysis Component     Latest Ref Rng & Units 02/20/2021  Specific Gravity, UA     1.005 - 1.030 1.025  pH, UA     5.0 - 7.5 7.0  Color, UA     Yellow Yellow  Appearance Ur     Clear Cloudy (A)  Leukocytes,UA     Negative Negative  Protein,UA     Negative/Trace Negative  Glucose, UA     Negative Negative  Ketones, UA     Negative Negative  RBC, UA     Negative Negative  Bilirubin, UA  Negative Negative  Urobilinogen, Ur     0.2 - 1.0 mg/dL 0.2  Nitrite, UA     Negative Negative  Microscopic Examination      See below:   Component     Latest Ref Rng & Units 02/20/2021  WBC, UA     0 - 5 /hpf 6-10 (A)  RBC     0 - 2 /hpf 0-2  Epithelial Cells (non renal)     0 - 10 /hpf 0-10  Bacteria, UA     None seen/Few Many (A)     I have reviewed the labs.   Pertinent Imaging: CLINICAL DATA:  Right flank pain, kidney stones suspected  EXAM: CT ABDOMEN AND PELVIS WITHOUT CONTRAST  TECHNIQUE: Multidetector CT imaging of the abdomen and pelvis was performed following the standard protocol without IV contrast.  COMPARISON:  01/18/2016  FINDINGS: Lower chest: No acute  abnormality.  Hepatobiliary: No solid liver abnormality is seen. No gallstones, gallbladder wall thickening, or biliary dilatation.  Pancreas: Unremarkable. No pancreatic ductal dilatation or surrounding inflammatory changes.  Spleen: Splenomegaly, maximum span 14.4 cm.  Adrenals/Urinary Tract: Adrenal glands are unremarkable. Kidneys are normal, without renal calculi, solid lesion, or hydronephrosis. Bladder is unremarkable.  Stomach/Bowel: Stomach is within normal limits. Appendix appears normal. No evidence of bowel wall thickening, distention, or inflammatory changes.  Vascular/Lymphatic: Scattered aortic atherosclerosis. No enlarged abdominal or pelvic lymph nodes.  Reproductive: No mass or other significant abnormality.  Other: No abdominal wall hernia or abnormality. No abdominopelvic ascites.  Musculoskeletal: No acute or significant osseous findings.  IMPRESSION: 1. No acute non-contrast CT findings of the abdomen or pelvis to explain right flank pain. No evidence of urinary tract calculus or hydronephrosis. 2. Splenomegaly, maximum span 14.4 cm. 3. Scattered aortic atherosclerosis, advanced for patient age.  Aortic Atherosclerosis (ICD10-I70.0).  These results will be called to the ordering clinician or representative by the Radiologist Assistant, and communication documented in the PACS or [employer].   Electronically Signed   By: Eddie Candle M.D.   On: 02/20/2021 13:16 I have independently reviewed the films.  See HPI.   Assessment & Plan:    1. Right flank pain - ?  Of stool burden, but patient states that this is chronic - UA with pyuria and bacteria - sent for culture - patient started on Keflex empirically  2. IBS/constipation - patient states this is chronic  3. Splenomegaly - advised to contact Dr. Netty Starring   Return for pending urine culture results .  These notes generated with voice recognition software. I  apologize for typographical errors.  Zara Council, PA-C  [facility] [address]  [place], [place] [address] [phone]

## 2021-02-21 ENCOUNTER — Encounter: Payer: Self-pay | Admitting: *Deleted

## 2021-02-22 LAB — MICROSCOPIC EXAMINATION

## 2021-02-22 LAB — URINALYSIS, COMPLETE
Bilirubin, UA: NEGATIVE
Glucose, UA: NEGATIVE
Ketones, UA: NEGATIVE
Leukocytes,UA: NEGATIVE
Nitrite, UA: NEGATIVE
Protein,UA: NEGATIVE
RBC, UA: NEGATIVE
Specific Gravity, UA: 1.025 (ref 1.005–1.030)
Urobilinogen, Ur: 0.2 mg/dL (ref 0.2–1.0)
pH, UA: 7 (ref 5.0–7.5)

## 2021-02-23 LAB — CULTURE, URINE COMPREHENSIVE

## 2021-02-28 ENCOUNTER — Other Ambulatory Visit: Payer: Self-pay | Admitting: Gastroenterology

## 2021-02-28 DIAGNOSIS — R6881 Early satiety: Secondary | ICD-10-CM

## 2021-02-28 DIAGNOSIS — R110 Nausea: Secondary | ICD-10-CM

## 2021-02-28 DIAGNOSIS — R1013 Epigastric pain: Secondary | ICD-10-CM

## 2021-03-20 ENCOUNTER — Other Ambulatory Visit: Payer: Self-pay

## 2021-03-20 ENCOUNTER — Ambulatory Visit
Admission: RE | Admit: 2021-03-20 | Discharge: 2021-03-20 | Disposition: A | Discharge status: Home / Self Care | Payer: BC Managed Care – PPO | Source: Ambulatory Visit | Attending: Gastroenterology | Admitting: Gastroenterology

## 2021-03-20 DIAGNOSIS — R110 Nausea: Secondary | ICD-10-CM | POA: Insufficient documentation

## 2021-03-20 DIAGNOSIS — R1013 Epigastric pain: Secondary | ICD-10-CM | POA: Insufficient documentation

## 2021-03-20 DIAGNOSIS — R6881 Early satiety: Secondary | ICD-10-CM

## 2021-03-20 MED ORDER — TECHNETIUM TC 99M MEBROFENIN IV KIT
5.0000 | PACK | Freq: Once | INTRAVENOUS | Status: AC | PRN
  Administered 2021-03-20: 4.511 via INTRAVENOUS

## 2021-04-04 ENCOUNTER — Ambulatory Visit: Payer: BC Managed Care – PPO | Admitting: Gastroenterology

## 2021-04-11 ENCOUNTER — Encounter: Payer: BC Managed Care – PPO | Admitting: Obstetrics and Gynecology

## 2021-05-01 NOTE — Patient Instructions (Signed)

## 2021-05-02 ENCOUNTER — Ambulatory Visit (INDEPENDENT_AMBULATORY_CARE_PROVIDER_SITE_OTHER): Payer: BC Managed Care – PPO | Admitting: Obstetrics and Gynecology

## 2021-05-02 ENCOUNTER — Other Ambulatory Visit: Payer: Self-pay

## 2021-05-02 ENCOUNTER — Encounter: Payer: Self-pay | Admitting: Obstetrics and Gynecology

## 2021-05-02 VITALS — BP 113/74 | HR 90 | Ht 67.0 in | Wt 197.4 lb

## 2021-05-02 DIAGNOSIS — E785 Hyperlipidemia, unspecified: Secondary | ICD-10-CM | POA: Diagnosis not present

## 2021-05-02 DIAGNOSIS — E669 Obesity, unspecified: Secondary | ICD-10-CM | POA: Diagnosis not present

## 2021-05-02 DIAGNOSIS — N750 Cyst of Bartholin's gland: Secondary | ICD-10-CM | POA: Diagnosis not present

## 2021-05-02 DIAGNOSIS — Z01419 Encounter for gynecological examination (general) (routine) without abnormal findings: Secondary | ICD-10-CM | POA: Diagnosis not present

## 2021-05-02 DIAGNOSIS — N920 Excessive and frequent menstruation with regular cycle: Secondary | ICD-10-CM

## 2021-05-02 DIAGNOSIS — F3281 Premenstrual dysphoric disorder: Secondary | ICD-10-CM

## 2021-05-02 MED ORDER — NORETHIN ACE-ETH ESTRAD-FE 1-20 MG-MCG PO TABS: 1.0000 | ORAL_TABLET | Freq: Every day | ORAL | 3 refills | Status: DC

## 2021-05-02 NOTE — Progress Notes (Signed)
GYNECOLOGY ANNUAL PHYSICAL EXAM PROGRESS NOTE  Subjective:    Deborah Fields is a 35 y.o. [id number] female who presents for an annual exam. The patient is sexually active. The patient wears seatbelts: yes. The patient participates in regular exercise: yes, just began exercising regulaly last week, and has joined [employer]. Has the patient ever been transfused or tattooed?: no.   The patient has the following complaints today:  1. Patient desires to discuss contraception.  Notes that her therapist believes that some of her mood issues are due to her PMDD.  Tried to use Klonopin 5 days before her cycle but notes that it did not help, just made her sleepy.  2. Still reports having a Bartholin's cyst. Has been doing home treatment measures.  Notes that it comes and goes.  Reports that it is not inflamed, but does cause some discomfort with intercourse.    Gynecologic History:  Contraception: tubal ligation History of STI's: Denies Last Pap: Approximately 07/2016. Results were: normal.  Denies h/o abnormal pap smears.   Menstrual History:  Menarche age: 40 Patient's last menstrual period was 04/28/2021. Period Duration (Days): 3-4 Period Pattern: Regular Menstrual Flow: Heavy,Moderate,Light Menstrual Control: Tampon,Thin pad Menstrual Control Change Freq (Hours): 2-6 Dysmenorrhea: (!) Moderate Dysmenorrhea Symptoms: Cramping,Headache      OB History  Gravida Para Term Preterm AB Living  2 2 2  0 0 2  SAB IAB Ectopic Multiple Live Births  0 0 0 0 2    # Outcome Date GA Lbr Len/2nd Weight Sex Delivery Anes PTL Lv  2 Term 10/30/17 [redacted]w[redacted]d  8 lb 9.6 oz (3.9 kg) F CS-LTranv Spinal  LIV     Name: Santucci,GIRL Lynnae     Apgar1: 7  Apgar5: 9  1 Term 2012 [redacted]w[redacted]d  8 lb 7 oz (3.827 kg) F CS-Unspec Spinal  LIV    Obstetric Comments  G1- Fetal tachycardia, arrest of descent in 2nd stage (pushed for 3 hrs).     Past Medical History:  Diagnosis Date  . Allergic rhinitis   .  Anemia    during pregnancy  . Anxiety    managed by Psych  . Asthma   . Depression    managed by psych  . Erosive esophagitis    seen on Colonoscopy  . GERD (gastroesophageal reflux disease)   . Headache   . Heartburn   . Hiatal hernia    shown on colonoscopy  . IBS (irritable bowel syndrome)    per colonoscopy in Feb 2015  . MRSA (methicillin resistant Staphylococcus aureus)   . Panic disorder without agoraphobia    managed by Psych  . Pneumonia 2016  . PONV (postoperative nausea and vomiting)     Past Surgical History:  Procedure Laterality Date  . CESAREAN SECTION    . CESAREAN SECTION WITH BILATERAL TUBAL LIGATION Bilateral 10/30/2017   Procedure: CESAREAN SECTION WITH BILATERAL TUBAL LIGATION;  Surgeon: 11/01/2017, MD;  Location: [facility] ORS;  Service: Obstetrics;  Laterality: Bilateral;  . COLONOSCOPY  Feb 2015   showed IBS, Hiatal Hernia, Erosive reflux esophagitis  . ESOPHAGOGASTRODUODENOSCOPY    . OVARIAN CYST REMOVAL Right   . TONSILLECTOMY    . TONSILLECTOMY      Family History  Problem Relation Age of Onset  . Diabetes Mother   . Hypertension Mother   . Hyperlipidemia Mother   . COPD Mother   . Heart disease Father   . Hypertension Father   . Hyperlipidemia Father   .  COPD Maternal Grandmother   . COPD Maternal Grandfather   . Cancer Paternal Grandfather        prostate  . Stroke Neg Hx   . Kidney disease Neg Hx   . Bladder Cancer Neg Hx     Social History   Socioeconomic History  . Marital status: Married    Spouse name: Not on file  . Number of children: Not on file  . Years of education: Not on file  . Highest education level: Not on file  Occupational History  . Not on file  Tobacco Use  . Smoking status: Former Smoker    Packs/day: 0.50    Years: 5.00    Pack years: 2.50    Types: Cigarettes  . Smokeless tobacco: Never Used  . Tobacco comment: quit 10 years  Vaping Use  . Vaping Use: Never used  Substance and Sexual Activity   . Alcohol use: No    Alcohol/week: 0.0 standard drinks  . Drug use: No  . Sexual activity: Yes    Birth control/protection: Surgical  Other Topics Concern  . Not on file  Social History Narrative  . Not on file   Social Determinants of Health   Financial Resource Strain: Not on file  Food Insecurity: Not on file  Transportation Needs: Not on file  Physical Activity: Not on file  Stress: Not on file  Social Connections: Not on file  Intimate Partner Violence: Not on file    Current Outpatient Medications on File Prior to Visit  Medication Sig Dispense Refill  . clonazePAM (KLONOPIN) 0.5 MG tablet Take 0.5 mg by mouth 2 (two) times daily as needed.  1  . DEXILANT 60 MG capsule TAKE 1 CAPSULE BY MOUTH EVERY DAY 90 capsule 1  . DULoxetine (CYMBALTA) 60 MG capsule Take 60 mg by mouth daily.    . fenofibrate 160 MG tablet Take 160 mg by mouth daily.    . fluticasone (FLONASE) 50 MCG/ACT nasal spray USE 2 SPRAYS IN BOTH NOSTRILS ONCE A DAY 16 g 12  . Probiotic Product (SOLUBLE FIBER/PROBIOTICS PO) Take 1 capsule at bedtime by mouth.     . sertraline (ZOLOFT) 100 MG tablet Take 200 mg by mouth daily.   3   No current facility-administered medications on file prior to visit.    Allergies  Allergen Reactions  . Sulfa Antibiotics Rash     Review of Systems Constitutional: negative for chills, fatigue, fevers and sweats Eyes: negative for irritation, redness and visual disturbance Ears, nose, mouth, throat, and face: negative for hearing loss, nasal congestion, snoring and tinnitus Respiratory: negative for asthma, cough, sputum Cardiovascular: negative for chest pain, dyspnea, exertional chest pressure/discomfort, irregular heart beat, palpitations and syncope Gastrointestinal: negative for abdominal pain, change in bowel habits, nausea and vomiting Genitourinary: negative for abnormal menstrual periods, genital lesions, sexual problems and vaginal discharge, dysuria and urinary  incontinence Integument/breast: negative for breast lump, breast tenderness and nipple discharge Hematologic/lymphatic: negative for bleeding and easy bruising Musculoskeletal:negative for back pain and muscle weakness Neurological: negative for dizziness, headaches, vertigo and weakness Endocrine: negative for diabetic symptoms including polydipsia, polyuria and skin dryness Allergic/Immunologic: negative for hay fever and urticaria    Psychological: positive for - anxiety, depression (well controlled on medications) and mood swings. Negative for - concentration difficulties, obsessive thoughts or suicidal ideation    Objective:  Blood pressure 113/74, pulse 90, height 5\' 7"  (1.702 m), weight 197 lb 6.4 oz (89.5 kg), last menstrual period 04/28/2021, unknown  if currently breastfeeding. Body mass index is 30.92 kg/m.  General Appearance:    Alert, cooperative, no distress, appears stated age, mild obesity  Head:    Normocephalic, without obvious abnormality, atraumatic  Eyes:    PERRL, conjunctiva/corneas clear, EOM's intact, both eyes  Ears:    Normal external ear canals, both ears  Nose:   Nares normal, septum midline, mucosa normal, no drainage or sinus tenderness  Throat:   Lips, mucosa, and tongue normal; teeth and gums normal  Neck:   Supple, symmetrical, trachea midline, no adenopathy; thyroid: no enlargement/tenderness/nodules; no carotid bruit or JVD  Back:     Symmetric, no curvature, ROM normal, no CVA tenderness  Lungs:     Clear to auscultation bilaterally, respirations unlabored  Chest Wall:    No tenderness or deformity   Heart:    Regular rate and rhythm, S1 and S2 normal, no murmur, rub or gallop  Breast Exam:    No tenderness, masses, or nipple abnormality  Abdomen:     Soft, non-tender, bowel sounds active all four quadrants, no masses, no organomegaly.    Genitalia:    Pelvic:external genitalia normal, vagina without lesions, discharge, or tenderness, rectovaginal  septum  normal. Cervix normal in appearance, no cervical motion tenderness, no adnexal masses or tenderness.  Uterus normal size, shape, mobile, regular contours, nontender.  Rectal:    Normal external sphincter.  No hemorrhoids appreciated. Internal exam not done.   Extremities:   Extremities normal, atraumatic, no cyanosis or edema  Pulses:   2+ and symmetric all extremities  Skin:   Skin color, texture, turgor normal, no rashes or lesions  Lymph nodes:   Cervical, supraclavicular, and axillary nodes normal  Neurologic:   CNII-XII intact, normal strength, sensation and reflexes throughout   .  Labs:  Lab Results  Component Value Date   WBC 4.9 11/02/2019   HGB 13.5 11/02/2019   HCT 39.2 11/02/2019   MCV 86 11/02/2019   PLT 259 11/02/2019    Lab Results  Component Value Date   CREATININE 0.87 11/02/2019   BUN 10 11/02/2019   NA 142 11/02/2019   K 4.1 11/02/2019   CL 103 11/02/2019   CO2 23 11/02/2019    Lab Results  Component Value Date   ALT 27 11/02/2019   AST 28 11/02/2019   ALKPHOS 77 11/02/2019   BILITOT 0.3 11/02/2019    Lab Results  Component Value Date   TSH 1.100 01/13/2018     Assessment:   Healthy female exam.  Anxiety and depression PMDD Heavy menstrual cycles Mild obesity Bartholin's cyst  Plan:    Blood tests: labs done by PCP.  Breast self exam technique reviewed and patient encouraged to perform self-exam monthly. Contraception: tubal ligation. Discussed healthy lifestyle modifications.  Also seeing a nutritionist.  Pap smear up to date.   Patient desires to resume hormonal contraception for management of her PMDD symptoms and heavy cycles. Desires to utilize pills. Started on Junel 20 mcg. Advised on Sunday start. Will take continuously to avoid alterations in hormone levels. Discussed option of increasing to 30 mcg dosing if symptoms not improved after 2-3 months of use.  Dyslipidemia managed by PCP.   Anxiety and depression managed by  psychiatrist.  No Bartholin's cyst noted on today's exam.  COVID vaccination status: up to date.  Follow up in 1 year for annual exam.    Hildred Laser, MD [facility]

## 2021-05-02 NOTE — Progress Notes (Signed)
Pt present for annual exam. Pt stated that she still has a the bartholin cyst.

## 2021-07-16 ENCOUNTER — Other Ambulatory Visit: Payer: Self-pay | Admitting: Gastroenterology

## 2021-07-18 ENCOUNTER — Other Ambulatory Visit: Payer: Self-pay | Admitting: Gastroenterology

## 2021-09-11 ENCOUNTER — Ambulatory Visit
Admission: RE | Admit: 2021-09-11 | Discharge: 2021-09-11 | Disposition: A | Discharge status: Home / Self Care | Payer: BC Managed Care – PPO | Source: Ambulatory Visit | Attending: Urology | Admitting: Urology

## 2021-09-11 ENCOUNTER — Ambulatory Visit
Admission: RE | Admit: 2021-09-11 | Discharge: 2021-09-11 | Disposition: A | Discharge status: Home / Self Care | Payer: BC Managed Care – PPO | Attending: Urology | Admitting: Urology

## 2021-09-11 ENCOUNTER — Encounter: Payer: Self-pay | Admitting: Urology

## 2021-09-11 ENCOUNTER — Other Ambulatory Visit: Payer: Self-pay

## 2021-09-11 ENCOUNTER — Ambulatory Visit: Payer: BC Managed Care – PPO | Admitting: Urology

## 2021-09-11 ENCOUNTER — Other Ambulatory Visit: Payer: Self-pay | Admitting: *Deleted

## 2021-09-11 VITALS — BP 112/71 | HR 79 | Ht 67.0 in | Wt 204.0 lb

## 2021-09-11 DIAGNOSIS — N201 Calculus of ureter: Secondary | ICD-10-CM

## 2021-09-11 DIAGNOSIS — R300 Dysuria: Secondary | ICD-10-CM

## 2021-09-11 LAB — BLADDER SCAN AMB NON-IMAGING

## 2021-09-11 MED ORDER — NITROFURANTOIN MONOHYD MACRO 100 MG PO CAPS: 100.0000 mg | ORAL_CAPSULE | Freq: Two times a day (BID) | ORAL | 0 refills | Status: DC

## 2021-09-11 NOTE — Progress Notes (Signed)
09/11/2021 2:08 PM   Deborah Fields February 09, 1986 [id number]  Referring provider: Marisue Ivan, MD [phone] [facility] [address] [facility] [place],  [place] [address]  Chief Complaint  Patient presents with   Dysuria    Urological history: 1. Nephrolithiasis - 08/2020 KUB notes a 4 mm radiopaque calcification located at the level of L3 suspicious for ureteral calculi.  She passed a stone that evening.    2. rUTI's - CT renal stone study in February 2017 was NED.  Cystoscopy in March 2017 was NED.  - risk factors of IBS   HPI: Deborah Fields is a 35 y.o. female who presents today for dysuria, frequency and smelly urine.    Her symptoms started yesterday.  She felt like she was peeing shards of glass.    Patient denies any modifying or aggravating factors.  Patient denies any gross hematuria, dysuria or suprapubic/flank pain.  Patient denies any fevers, chills, nausea or vomiting.    UA 11-30 WBC's, 11-30 RBC's and many bacteria.  PVR 2 mL  KUB moderate stool burden, no stone seen  PMH: Past Medical History:  Diagnosis Date   Allergic rhinitis    Anemia    during pregnancy   Anxiety    managed by Psych   Asthma    Depression    managed by psych   Erosive esophagitis    seen on Colonoscopy   GERD (gastroesophageal reflux disease)    Headache    Heartburn    Hiatal hernia    shown on colonoscopy   IBS (irritable bowel syndrome)    per colonoscopy in Feb 2015   MRSA (methicillin resistant Staphylococcus aureus)    Panic disorder without agoraphobia    managed by Psych   Pneumonia 2016   PONV (postoperative nausea and vomiting)     Surgical History: Past Surgical History:  Procedure Laterality Date   CESAREAN SECTION     CESAREAN SECTION WITH BILATERAL TUBAL LIGATION Bilateral 10/30/2017   Procedure: CESAREAN SECTION WITH BILATERAL TUBAL LIGATION;  Surgeon: Hildred Laser, MD;  Location: [facility] ORS;  Service: Obstetrics;  Laterality: Bilateral;    COLONOSCOPY  Feb 2015   showed IBS, Hiatal Hernia, Erosive reflux esophagitis   ESOPHAGOGASTRODUODENOSCOPY     OVARIAN CYST REMOVAL Right    TONSILLECTOMY     TONSILLECTOMY      Home Medications:  Allergies as of 09/11/2021       Reactions   Sulfa Antibiotics Rash        Medication List        Accurate as of September 11, 2021 11:59 PM. If you have any questions, ask your nurse or doctor.          clonazePAM 0.5 MG tablet Commonly known as: KLONOPIN Take 0.5 mg by mouth 2 (two) times daily as needed.   Dexilant 60 MG capsule Generic drug: dexlansoprazole TAKE 1 CAPSULE BY MOUTH EVERY DAY   DULoxetine 60 MG capsule Commonly known as: CYMBALTA Take 60 mg by mouth daily.   fenofibrate 160 MG tablet Take 160 mg by mouth daily.   fluticasone 50 MCG/ACT nasal spray Commonly known as: FLONASE USE 2 SPRAYS IN BOTH NOSTRILS ONCE A DAY   nitrofurantoin (macrocrystal-monohydrate) 100 MG capsule Commonly known as: MACROBID Take 1 capsule (100 mg total) by mouth every 12 (twelve) hours. Started by: Michiel Cowboy, PA-C   norethindrone-ethinyl estradiol-FE 1-20 MG-MCG tablet Commonly known as: Junel FE 1/20 Take 1 tablet by mouth daily. Take continuously.  Take active pills only, discard placebo pills.   sertraline 100 MG tablet Commonly known as: ZOLOFT Take 200 mg by mouth daily.   SOLUBLE FIBER/PROBIOTICS PO Take 1 capsule at bedtime by mouth.        Allergies:  Allergies  Allergen Reactions   Sulfa Antibiotics Rash    Family History: Family History  Problem Relation Age of Onset   Diabetes Mother    Hypertension Mother    Hyperlipidemia Mother    COPD Mother    Heart disease Father    Hypertension Father    Hyperlipidemia Father    COPD Maternal Grandmother    COPD Maternal Grandfather    Cancer Paternal Grandfather        prostate   Stroke Neg Hx    Kidney disease Neg Hx    Bladder Cancer Neg Hx     Social History:  reports that she has  quit smoking. Her smoking use included cigarettes. She has a 2.50 pack-year smoking history. She has never used smokeless tobacco. She reports that she does not drink alcohol and does not use drugs.  ROS: For pertinent review of systems please refer to history of present illness  Physical Exam: BP 112/71   Pulse 79   Ht 5\' 7"  (1.702 m)   Wt 204 lb (92.5 kg)   BMI 31.95 kg/m   Constitutional:  Well nourished. Alert and oriented, No acute distress. HEENT: [place] AT, mask in place.  Trachea midline Cardiovascular: No clubbing, cyanosis, or edema. Respiratory: Normal respiratory effort, no increased work of breathing. Neurologic: Grossly intact, no focal deficits, moving all 4 extremities. Psychiatric: Normal mood and affect.    Laboratory Data: Urinalysis Component     Latest Ref Rng & Units 09/11/2021  Specific Gravity, UA     1.005 - 1.030 1.020  pH, UA     5.0 - 7.5 7.0  Color, UA     Yellow Yellow  Appearance Ur     Clear Hazy (A)  Leukocytes,UA     Negative Negative  Protein,UA     Negative/Trace Negative  Glucose, UA     Negative Negative  Ketones, UA     Negative Negative  RBC, UA     Negative 2+ (A)  Bilirubin, UA     Negative Negative  Urobilinogen, Ur     0.2 - 1.0 mg/dL 0.2  Nitrite, UA     Negative Negative  Microscopic Examination      See below:   Component     Latest Ref Rng & Units 09/11/2021  WBC, UA     0 - 5 /hpf 11-30 (A)  RBC     0 - 2 /hpf 11-30 (A)  Epithelial Cells (non renal)     0 - 10 /hpf 0-10  Bacteria, UA     None seen/Few Many (A)  I have reviewed the labs.   Pertinent Imaging: Narrative & Impression  CLINICAL DATA:  Right nephrolithiasis   EXAM: ABDOMEN - 1 VIEW   COMPARISON:  02/20/2021   FINDINGS: The bowel gas pattern is normal. No radio-opaque calculi or other significant radiographic abnormality are seen.   IMPRESSION: Negative.     Electronically Signed   By: 04/22/2021 M.D.   On: 09/13/2021 03:26     I have independently reviewed the films.  See HPI.   Assessment & Plan:    1. UTI -UA grossly infected -Urine sent for culture -Macrobid 100 mg, twice daily sent to pharmacy,  will adjust if necessary once urine culture results are available  Return if symptoms worsen or fail to improve.  These notes generated with voice recognition software. I apologize for typographical errors.  Michiel Cowboy, PA-C  [facility] [address]  [address] [place], [place] [address] [phone]

## 2021-09-12 LAB — URINALYSIS, COMPLETE
Bilirubin, UA: NEGATIVE
Glucose, UA: NEGATIVE
Ketones, UA: NEGATIVE
Leukocytes,UA: NEGATIVE
Nitrite, UA: NEGATIVE
Protein,UA: NEGATIVE
Specific Gravity, UA: 1.02 (ref 1.005–1.030)
Urobilinogen, Ur: 0.2 mg/dL (ref 0.2–1.0)
pH, UA: 7 (ref 5.0–7.5)

## 2021-09-12 LAB — MICROSCOPIC EXAMINATION

## 2021-09-12 NOTE — Telephone Encounter (Signed)
error 

## 2021-09-18 LAB — CULTURE, URINE COMPREHENSIVE

## 2022-03-06 ENCOUNTER — Other Ambulatory Visit: Payer: Self-pay | Admitting: Gastroenterology

## 2022-03-06 ENCOUNTER — Other Ambulatory Visit (HOSPITAL_COMMUNITY): Payer: Self-pay | Admitting: Gastroenterology

## 2022-03-06 DIAGNOSIS — R110 Nausea: Secondary | ICD-10-CM

## 2022-03-06 DIAGNOSIS — R1011 Right upper quadrant pain: Secondary | ICD-10-CM

## 2022-03-11 ENCOUNTER — Ambulatory Visit
Admission: RE | Admit: 2022-03-11 | Discharge: 2022-03-11 | Disposition: A | Discharge status: Home / Self Care | Payer: BC Managed Care – PPO | Source: Ambulatory Visit | Attending: Gastroenterology | Admitting: Gastroenterology

## 2022-03-11 DIAGNOSIS — R110 Nausea: Secondary | ICD-10-CM | POA: Diagnosis present

## 2022-03-11 DIAGNOSIS — R1011 Right upper quadrant pain: Secondary | ICD-10-CM | POA: Insufficient documentation

## 2022-03-24 ENCOUNTER — Ambulatory Visit: Payer: Self-pay | Admitting: Surgery

## 2022-03-24 NOTE — H&P (Signed)
Subjective: ? ?CC: Biliary colic [K80.50] ? ?HPI: ? Deborah Fields is a 36 y.o. female who was referred by Jeni Salles,* for evaluation of above CC. Symptoms were first noted 1 year ago. 3-4 episodes/mo Pain is sharp, right upper quadrant to right shoulder pain, without radiation. Lasts a few hours then resolves.  Associated with nausea, pale loose stool, exacerbated by food consumption.      ?Past Medical History:  has a past medical history of Allergic rhinitis due to allergen, Arrhythmia (05/15/2021), Generalized anxiety disorder, GERD (gastroesophageal reflux disease), Hyperlipidemia, Major depressive disorder, and Pneumonia. ? ?Past Surgical History:  has a past surgical history that includes Cesarean section; Tonsillectomy; Plantar fasciectomy (Left, 09/2020); egd (09/04/2021); and Tubal ligation (10/30/2017). ? ?Family History: family history includes Anxiety in her father and sister; Asthma in her mother; COPD in her mother; Coronary Artery Disease (Blocked arteries around heart) in her father and mother; Depression in her father, mother, and sister; Diabetes in her mother; Diabetes type II in her mother; Heart disease in her father; High blood pressure (Hypertension) in her father and mother; Hyperlipidemia (Elevated cholesterol) in her father and mother; Obesity in her mother; Osteoporosis (Thinning of bones) in her mother. ? ?Social History:  reports that she has quit smoking. She has never used smokeless tobacco. She reports that she does not drink alcohol and does not use drugs. ? ?Current Medications: has a current medication list which includes the following prescription(s): albuterol, clonazepam, dexlansoprazole, duloxetine, fenofibrate, hydrocortisone, magnesium oxide, metoprolol tartrate, multivitamin, sertraline, covid-19 at-home test, liraglutide (weight loss), and rosuvastatin. ? ?Allergies:  ?Allergies as of 03/24/2022 - Reviewed 03/24/2022 ?Allergen Reaction Noted ? Sulfa  (sulfonamide antibiotics) Hives and Rash 04/24/2015 ? ? ?ROS:  ?A 15 point review of systems was performed and pertinent positives and negatives noted in HPI  ?  ?Objective: ?  ? ?BP 103/71   Pulse 88   Ht 170.2 cm (5\' 7" )   Wt 94.8 kg (209 lb)   BMI 32.73 kg/m?  ? ? ?Constitutional :  No distress, cooperative, alert ?Lymphatics/Throat:  Supple with no lymphadenopathy ?Respiratory:  Clear to auscultation bilaterally ?Cardiovascular:  Regular rate and rhythm ?Gastrointestinal: Soft, non-tender, non-distended, no organomegaly. ?Musculoskeletal: Steady gait and movement ?Skin: Cool and moist, no surgical scars ?Psychiatric: Normal affect, non-agitated, not confused ?  ?  ?LABS:  ?n/a  ? ?RADS: ?CLINICAL DATA:  Right upper quadrant abdominal pain and nausea.  ? ?EXAM:  ?ULTRASOUND ABDOMEN LIMITED RIGHT UPPER QUADRANT  ? ?COMPARISON:  CT abdomen pelvis dated 02/20/2021.  ? ?FINDINGS:  ?Gallbladder:  ? ?No gallstones or wall thickening visualized. No sonographic 04/22/2021  ?sign noted by sonographer.  ? ?Common bile duct:  ? ?Diameter: 4 mm  ? ?Liver:  ? ?No focal lesion identified. Within normal limits in parenchymal  ?echogenicity. Portal vein is patent on color Doppler imaging with  ?normal direction of blood flow towards the liver.  ? ?Other: None.  ? ?IMPRESSION:  ?Unremarkable right upper quadrant ultrasound.  ? ? ?Electronically Signed  ?  By: Eulah Pont M.D.  ?  On: 03/12/2022 03:19 ? ?CLINICAL DATA:  Two months of intermittent right upper quadrant pain  ? ?EXAM:  ?NUCLEAR MEDICINE HEPATOBILIARY IMAGING WITH GALLBLADDER EF  ? ?TECHNIQUE:  ?Sequential images of the abdomen were obtained out to 60 minutes  ?following intravenous administration of radiopharmaceutical. After  ?oral ingestion of Ensure, gallbladder ejection fraction was  ?determined. At 60 min, normal ejection fraction is greater than 33%.  ? ?  RADIOPHARMACEUTICALS:  4.511 mCi Tc-77m  Choletec IV  ? ?COMPARISON:  CT abdomen and pelvis February 20, 2021.  ? ?FINDINGS:  ?Prompt uptake and biliary excretion of activity by the liver is  ?seen. Gallbladder activity is visualized, consistent with patency of  ?cystic duct. Biliary activity passes into small bowel, consistent  ?with patent common bile duct.  ? ?Calculated gallbladder ejection fraction is 55%. (Normal gallbladder  ?ejection fraction with Ensure is greater than 33%.). Patient  ?reported 6/10 right upper quadrant pain and became nauseous upon  ?ingestion of Ensure.  ? ?IMPRESSION:  ?Normal gallbladder ejection fraction.  ? ?However, patient reports 6/10 right upper quadrant pain and nausea  ?upon ingestion of Ensure, which is nonspecific.  ? ? ?Electronically Signed  ?  By: Maudry Mayhew MD  ?  On: 03/21/2021 09:51 ? ? ?Assessment: ? ?   ?Biliary colic [K80.50] ?Workup so far negative to date, but persistent symptoms for a year, with very typical presentation for gallbladder etiology, so will proceed with lap chole.  Understanding no guarantees in symptoms relief. ? ?Plan: ? ?  ?1. Biliary colic [K80.50] ?Discussed the risk of surgery including post-op infxn, seroma, biloma, chronic pain, poor-delayed wound healing, retained gallstone, conversion to open procedure, post-op SBO or ileus, and need for additional procedures to address said risks.  The risks of general anesthetic including MI, CVA, sudden death or even reaction to anesthetic medications also discussed. Alternatives include continued observation.  Benefits include possible symptom relief, prevention of complications including acute cholecystitis, pancreatitis. ? ?Typical post operative recovery of 3-5 days rest, continued pain in area and incision sites, possible loose stools up to 4-6 weeks, also discussed. ? ?ED return precautions given for sudden increase in RUQ pain, with possible accompanying fever, nausea, and/or vomiting. ? ?The patient understands the risks, any and all questions were answered to the patient's  satisfaction. ? ?labs/images/medications/previous chart entries reviewed personally and relevant changes/updates noted above. ? ?

## 2022-03-25 ENCOUNTER — Other Ambulatory Visit: Payer: Self-pay | Admitting: Internal Medicine

## 2022-03-25 DIAGNOSIS — E010 Iodine-deficiency related diffuse (endemic) goiter: Secondary | ICD-10-CM

## 2022-03-25 DIAGNOSIS — E781 Pure hyperglyceridemia: Secondary | ICD-10-CM

## 2022-04-02 ENCOUNTER — Other Ambulatory Visit: Payer: Self-pay

## 2022-04-02 ENCOUNTER — Ambulatory Visit
Admission: RE | Admit: 2022-04-02 | Discharge: 2022-04-02 | Disposition: A | Discharge status: Home / Self Care | Payer: BC Managed Care – PPO | Source: Ambulatory Visit | Attending: Internal Medicine | Admitting: Internal Medicine

## 2022-04-02 DIAGNOSIS — E010 Iodine-deficiency related diffuse (endemic) goiter: Secondary | ICD-10-CM | POA: Insufficient documentation

## 2022-04-02 DIAGNOSIS — E781 Pure hyperglyceridemia: Secondary | ICD-10-CM | POA: Diagnosis present

## 2022-05-05 NOTE — Progress Notes (Unsigned)
GYNECOLOGY ANNUAL PHYSICAL EXAM PROGRESS NOTE  Subjective:    Deborah Fields is a 36 y.o. G30P2002 female who presents for an annual exam. The patient has no complaints today. The patient is sexually active. The patient participates in regular exercise: no. Has the patient ever been transfused or tattooed?: no. The patient reports that there is not domestic violence in her life.   Mirka desires to note the following today.  No longer taking her OCPs for menstrual management as continues to have terrible mood  changes. Notes that her Psychiatrist does believe she has PMDD.  Reports her PCP has taken over management of her cholesterol.  Would like a referral to a nutritionist. States she has some issues with overeating. Has seen one in the recent past but did not feel a connection.     Menstrual History: Menarche age: 37 No LMP recorded. Period Cycle (Days): 26 Period Duration (Days): 4 Period Pattern: Regular Menstrual Flow: Heavy Menstrual Control: Maxi pad, Tampon Menstrual Control Change Freq (Hours): 2 Dysmenorrhea: (!) Moderate Dysmenorrhea Symptoms: Cramping, Nausea, Headache  Gynecologic History:  Contraception: tubal ligation History of STI's: Denies Last Pap: 06/02/2019. Results were: normal.  Denies  abnormal pap smears.   OB History  Gravida Para Term Preterm AB Living  2 2 2  0 0 2  SAB IAB Ectopic Multiple Live Births  0 0 0 0 2    # Outcome Date GA Lbr Len/2nd Weight Sex Delivery Anes PTL Lv  2 Term 10/30/17 [redacted]w[redacted]d  8 lb 9.6 oz (3.9 kg) F CS-LTranv Spinal  LIV     Name: Bong,GIRL Shawan     Apgar1: 7  Apgar5: 9  1 Term 2012 [redacted]w[redacted]d  8 lb 7 oz (3.827 kg) F CS-Unspec Spinal  LIV    Obstetric Comments  G1- Fetal tachycardia, arrest of descent in 2nd stage (pushed for 3 hrs).     Past Medical History:  Diagnosis Date   Allergic rhinitis    Anemia    during pregnancy   Anxiety    managed by Psych   Asthma    Depression    managed by psych    Erosive esophagitis    seen on Colonoscopy   GERD (gastroesophageal reflux disease)    Headache    Heartburn    Hiatal hernia    shown on colonoscopy   IBS (irritable bowel syndrome)    per colonoscopy in Feb 2015   MRSA (methicillin resistant Staphylococcus aureus)    Panic disorder without agoraphobia    managed by Psych   Pneumonia 2016   PONV (postoperative nausea and vomiting)     Past Surgical History:  Procedure Laterality Date   CESAREAN SECTION     CESAREAN SECTION WITH BILATERAL TUBAL LIGATION Bilateral 10/30/2017   Procedure: CESAREAN SECTION WITH BILATERAL TUBAL LIGATION;  Surgeon: 11/01/2017, MD;  Location: [facility];  Service: Obstetrics;  Laterality: Bilateral;   COLONOSCOPY  Feb 2015   showed IBS, Hiatal Hernia, Erosive reflux esophagitis   ESOPHAGOGASTRODUODENOSCOPY     OVARIAN CYST REMOVAL Right    TONSILLECTOMY     TONSILLECTOMY      Family History  Problem Relation Age of Onset   Diabetes Mother    Hypertension Mother    Hyperlipidemia Mother    COPD Mother    Heart disease Father    Hypertension Father    Hyperlipidemia Father    COPD Maternal Grandmother    COPD Maternal Grandfather    Cancer  Paternal Grandfather        prostate   Stroke Neg Hx    Kidney disease Neg Hx    Bladder Cancer Neg Hx     Social History   Socioeconomic History   Marital status: Married    Spouse name: Not on file   Number of children: Not on file   Years of education: Not on file   Highest education level: Not on file  Occupational History   Not on file  Tobacco Use   Smoking status: Former    Packs/day: 0.50    Years: 5.00    Pack years: 2.50    Types: Cigarettes   Smokeless tobacco: Never   Tobacco comments:    quit 10 years  Vaping Use   Vaping Use: Never used  Substance and Sexual Activity   Alcohol use: No    Alcohol/week: 0.0 standard drinks   Drug use: No   Sexual activity: Yes    Birth control/protection: Surgical  Other Topics Concern    Not on file  Social History Narrative   Not on file   Social Determinants of Health   Financial Resource Strain: Not on file  Food Insecurity: Not on file  Transportation Needs: Not on file  Physical Activity: Not on file  Stress: Not on file  Social Connections: Not on file  Intimate Partner Violence: Not on file    Current Outpatient Medications on File Prior to Visit  Medication Sig Dispense Refill   clonazePAM (KLONOPIN) 0.5 MG tablet Take 0.5 mg by mouth 2 (two) times daily as needed.  1   DEXILANT 60 MG capsule TAKE 1 CAPSULE BY MOUTH EVERY DAY 90 capsule 1   DULoxetine (CYMBALTA) 60 MG capsule Take 60 mg by mouth daily.     fenofibrate 160 MG tablet Take 160 mg by mouth daily.     fluticasone (FLONASE) 50 MCG/ACT nasal spray USE 2 SPRAYS IN BOTH NOSTRILS ONCE A DAY 16 g 12   Probiotic Product (SOLUBLE FIBER/PROBIOTICS PO) Take 1 capsule at bedtime by mouth.      sertraline (ZOLOFT) 100 MG tablet Take 200 mg by mouth daily.   3   No current facility-administered medications on file prior to visit.    Allergies  Allergen Reactions   Sulfa Antibiotics Rash     Review of Systems Constitutional: negative for chills, fatigue, fevers and sweats Eyes: negative for irritation, redness and visual disturbance Ears, nose, mouth, throat, and face: negative for hearing loss, nasal congestion, snoring and tinnitus Respiratory: negative for asthma, cough, sputum Cardiovascular: negative for chest pain, dyspnea, exertional chest pressure/discomfort, irregular heart beat, palpitations and syncope Gastrointestinal: negative for abdominal pain, change in bowel habits, nausea and vomiting Genitourinary: negative for abnormal menstrual periods, genital lesions, sexual problems and vaginal discharge, dysuria and urinary incontinence Integument/breast: negative for breast lump, breast tenderness and nipple discharge Hematologic/lymphatic: negative for bleeding and easy  bruising Musculoskeletal:negative for back pain and muscle weakness Neurological: negative for dizziness, headaches, vertigo and weakness Endocrine: negative for diabetic symptoms including polydipsia, polyuria and skin dryness Allergic/Immunologic: negative for hay fever and urticaria      Objective:  Blood pressure 99/66, pulse 91, resp. rate 16, height  (1.702 m), weight 211 lb 4.8 oz (95.8 kg), unknown if currently breastfeeding. Body mass index is 33.09 kg/m.     General Appearance:    Alert, cooperative, no distress, appears stated age  Head:    Normocephalic, without obvious abnormality, atraumatic  Eyes:  PERRL, conjunctiva/corneas clear, EOM's intact, both eyes  Ears:    Normal external ear canals, both ears  Nose:   Nares normal, septum midline, mucosa normal, no drainage or sinus tenderness  Throat:   Lips, mucosa, and tongue normal; teeth and gums normal  Neck:   Supple, symmetrical, trachea midline, no adenopathy; thyroid: no enlargement/tenderness/nodules; no carotid bruit or JVD  Back:     Symmetric, no curvature, ROM normal, no CVA tenderness  Lungs:     Clear to auscultation bilaterally, respirations unlabored  Chest Wall:    No tenderness or deformity   Heart:    Regular rate and rhythm, S1 and S2 normal, no murmur, rub or gallop  Breast Exam:    No tenderness, masses, or nipple abnormality  Abdomen:     Soft, non-tender, bowel sounds active all four quadrants, no masses, no organomegaly.    Genitalia:    Pelvic:external genitalia normal, vagina without lesions, discharge, or tenderness, rectovaginal septum  normal. Cervix normal in appearance, no cervical motion tenderness, no adnexal masses or tenderness.  Uterus normal size, shape, mobile, regular contours, nontender.  Rectal:    Normal external sphincter.  No hemorrhoids appreciated. Internal exam not done.   Extremities:   Extremities normal, atraumatic, no cyanosis or edema  Pulses:   2+ and symmetric all  extremities  Skin:   Skin color, texture, turgor normal, no rashes or lesions  Lymph nodes:   Cervical, supraclavicular, and axillary nodes normal  Neurologic:   CNII-XII intact, normal strength, sensation and reflexes throughout   .  Labs:  Lab Results  Component Value Date   WBC 4.9 11/02/2019   HGB 13.5 11/02/2019   HCT 39.2 11/02/2019   MCV 86 11/02/2019   PLT 259 11/02/2019    Lab Results  Component Value Date   CREATININE 0.87 11/02/2019   BUN 10 11/02/2019   NA 142 11/02/2019   K 4.1 11/02/2019   CL 103 11/02/2019   CO2 23 11/02/2019    Lab Results  Component Value Date   ALT 27 11/02/2019   AST 28 11/02/2019   ALKPHOS 77 11/02/2019   BILITOT 0.3 11/02/2019    Lab Results  Component Value Date   TSH 1.100 01/13/2018     Assessment:   1. Well woman exam with routine gynecological exam   2. Obesity (BMI 30.0-34.9)   3. Dyslipidemia (high LDL; low HDL)   4. PMDD (premenstrual dysphoric disorder)   5. Anxiety and depression   6. Menorrhagia with regular cycle      Plan:  Blood tests: Done by PCP.   Breast self exam technique reviewed and patient encouraged to perform self-exam monthly. Contraception: tubal ligation. Discussed healthy lifestyle modifications. Patient desires referral to Nutritionist.  Mammogram  : Not age appropriate Pap smear ordered. COVID vaccination status: Has received Moderna series.  PMDD, now finally stable again after d/c Lo Loestrin.  Anxiety and depression managed by Psychiatry.  Menorrhagia with regular cycle, currently notes it is manageable of OCPs.  Dyslipidemia now managed by PCP.  Follow up in 1 year for annual exam   Hildred Laser, MD [facility]

## 2022-05-05 NOTE — Progress Notes (Deleted)
GYNECOLOGY ANNUAL PHYSICAL EXAM PROGRESS NOTE  Subjective:    Deborah Fields is a 36 y.o. G74P2002 female who presents for an annual exam. The patient has no complaints today. The patient {is/is not/has never been:13135} sexually active. The patient participates in regular exercise: {yes/no/not asked:9010}. Has the patient ever been transfused or tattooed?: {yes/no/not asked:9010}. The patient reports that there {is/is not:9024} domestic violence in her life.    Menstrual History: Menarche age: *** No LMP recorded.     Gynecologic History:  Contraception: {method:5051} History of STI's:  Last Pap: ***. Results were: {norm/abn:16337}.  ***Denies/Notes h/o abnormal pap smears. Last mammogram: ***. Results were: {norm/abn:16337}       OB History  Gravida Para Term Preterm AB Living  2 2 2  0 0 2  SAB IAB Ectopic Multiple Live Births  0 0 0 0 2    # Outcome Date GA Lbr Len/2nd Weight Sex Delivery Anes PTL Lv  2 Term 10/30/17 [redacted]w[redacted]d  8 lb 9.6 oz (3.9 kg) F CS-LTranv Spinal  LIV     Name: Bass,GIRL Gelene     Apgar1: 7  Apgar5: 9  1 Term 2012 [redacted]w[redacted]d  8 lb 7 oz (3.827 kg) F CS-Unspec Spinal  LIV    Obstetric Comments  G1- Fetal tachycardia, arrest of descent in 2nd stage (pushed for 3 hrs).     Past Medical History:  Diagnosis Date   Allergic rhinitis    Anemia    during pregnancy   Anxiety    managed by Psych   Asthma    Depression    managed by psych   Erosive esophagitis    seen on Colonoscopy   GERD (gastroesophageal reflux disease)    Headache    Heartburn    Hiatal hernia    shown on colonoscopy   IBS (irritable bowel syndrome)    per colonoscopy in Feb 2015   MRSA (methicillin resistant Staphylococcus aureus)    Panic disorder without agoraphobia    managed by Psych   Pneumonia 2016   PONV (postoperative nausea and vomiting)     Past Surgical History:  Procedure Laterality Date   CESAREAN SECTION     CESAREAN SECTION WITH BILATERAL TUBAL  LIGATION Bilateral 10/30/2017   Procedure: CESAREAN SECTION WITH BILATERAL TUBAL LIGATION;  Surgeon: Rubie Maid, MD;  Location: [facility];  Service: Obstetrics;  Laterality: Bilateral;   COLONOSCOPY  Feb 2015   showed IBS, Hiatal Hernia, Erosive reflux esophagitis   ESOPHAGOGASTRODUODENOSCOPY     OVARIAN CYST REMOVAL Right    TONSILLECTOMY     TONSILLECTOMY      Family History  Problem Relation Age of Onset   Diabetes Mother    Hypertension Mother    Hyperlipidemia Mother    COPD Mother    Heart disease Father    Hypertension Father    Hyperlipidemia Father    COPD Maternal Grandmother    COPD Maternal Grandfather    Cancer Paternal Grandfather        prostate   Stroke Neg Hx    Kidney disease Neg Hx    Bladder Cancer Neg Hx     Social History   Socioeconomic History   Marital status: Married    Spouse name: Not on file   Number of children: Not on file   Years of education: Not on file   Highest education level: Not on file  Occupational History   Not on file  Tobacco Use   Smoking status:  Former    Packs/day: 0.50    Years: 5.00    Pack years: 2.50    Types: Cigarettes   Smokeless tobacco: Never   Tobacco comments:    quit 10 years  Vaping Use   Vaping Use: Never used  Substance and Sexual Activity   Alcohol use: No    Alcohol/week: 0.0 standard drinks   Drug use: No   Sexual activity: Yes    Birth control/protection: Surgical  Other Topics Concern   Not on file  Social History Narrative   Not on file   Social Determinants of Health   Financial Resource Strain: Not on file  Food Insecurity: Not on file  Transportation Needs: Not on file  Physical Activity: Not on file  Stress: Not on file  Social Connections: Not on file  Intimate Partner Violence: Not on file    Current Outpatient Medications on File Prior to Visit  Medication Sig Dispense Refill   clonazePAM (KLONOPIN) 0.5 MG tablet Take 0.5 mg by mouth 2 (two) times daily as needed.  1    DEXILANT 60 MG capsule TAKE 1 CAPSULE BY MOUTH EVERY DAY 90 capsule 1   DULoxetine (CYMBALTA) 60 MG capsule Take 60 mg by mouth daily.     fenofibrate 160 MG tablet Take 160 mg by mouth daily.     fluticasone (FLONASE) 50 MCG/ACT nasal spray USE 2 SPRAYS IN BOTH NOSTRILS ONCE A DAY (Patient not taking: Reported on 09/11/2021) 16 g 12   nitrofurantoin, macrocrystal-monohydrate, (MACROBID) 100 MG capsule Take 1 capsule (100 mg total) by mouth every 12 (twelve) hours. 14 capsule 0   norethindrone-ethinyl estradiol-FE (JUNEL FE 1/20) 1-20 MG-MCG tablet Take 1 tablet by mouth daily. Take continuously. Take active pills only, discard placebo pills. (Patient not taking: Reported on 09/11/2021) 112 tablet 3   Probiotic Product (SOLUBLE FIBER/PROBIOTICS PO) Take 1 capsule at bedtime by mouth.      sertraline (ZOLOFT) 100 MG tablet Take 200 mg by mouth daily.   3   No current facility-administered medications on file prior to visit.    Allergies  Allergen Reactions   Sulfa Antibiotics Rash     Review of Systems Constitutional: negative for chills, fatigue, fevers and sweats Eyes: negative for irritation, redness and visual disturbance Ears, nose, mouth, throat, and face: negative for hearing loss, nasal congestion, snoring and tinnitus Respiratory: negative for asthma, cough, sputum Cardiovascular: negative for chest pain, dyspnea, exertional chest pressure/discomfort, irregular heart beat, palpitations and syncope Gastrointestinal: negative for abdominal pain, change in bowel habits, nausea and vomiting Genitourinary: negative for abnormal menstrual periods, genital lesions, sexual problems and vaginal discharge, dysuria and urinary incontinence Integument/breast: negative for breast lump, breast tenderness and nipple discharge Hematologic/lymphatic: negative for bleeding and easy bruising Musculoskeletal:negative for back pain and muscle weakness Neurological: negative for dizziness, headaches,  vertigo and weakness Endocrine: negative for diabetic symptoms including polydipsia, polyuria and skin dryness Allergic/Immunologic: negative for hay fever and urticaria      Objective:  unknown if currently breastfeeding. There is no height or weight on file to calculate BMI.    General Appearance:    Alert, cooperative, no distress, appears stated age  Head:    Normocephalic, without obvious abnormality, atraumatic  Eyes:    PERRL, conjunctiva/corneas clear, EOM's intact, both eyes  Ears:    Normal external ear canals, both ears  Nose:   Nares normal, septum midline, mucosa normal, no drainage or sinus tenderness  Throat:   Lips, mucosa, and tongue  normal; teeth and gums normal  Neck:   Supple, symmetrical, trachea midline, no adenopathy; thyroid: no enlargement/tenderness/nodules; no carotid bruit or JVD  Back:     Symmetric, no curvature, ROM normal, no CVA tenderness  Lungs:     Clear to auscultation bilaterally, respirations unlabored  Chest Wall:    No tenderness or deformity   Heart:    Regular rate and rhythm, S1 and S2 normal, no murmur, rub or gallop  Breast Exam:    No tenderness, masses, or nipple abnormality  Abdomen:     Soft, non-tender, bowel sounds active all four quadrants, no masses, no organomegaly.    Genitalia:    Pelvic:external genitalia normal, vagina without lesions, discharge, or tenderness, rectovaginal septum  normal. Cervix normal in appearance, no cervical motion tenderness, no adnexal masses or tenderness.  Uterus normal size, shape, mobile, regular contours, nontender.  Rectal:    Normal external sphincter.  No hemorrhoids appreciated. Internal exam not done.   Extremities:   Extremities normal, atraumatic, no cyanosis or edema  Pulses:   2+ and symmetric all extremities  Skin:   Skin color, texture, turgor normal, no rashes or lesions  Lymph nodes:   Cervical, supraclavicular, and axillary nodes normal  Neurologic:   CNII-XII intact, normal strength,  sensation and reflexes throughout   .  Labs:  Lab Results  Component Value Date   WBC 4.9 11/02/2019   HGB 13.5 11/02/2019   HCT 39.2 11/02/2019   MCV 86 11/02/2019   PLT 259 11/02/2019    Lab Results  Component Value Date   CREATININE 0.87 11/02/2019   BUN 10 11/02/2019   NA 142 11/02/2019   K 4.1 11/02/2019   CL 103 11/02/2019   CO2 23 11/02/2019    Lab Results  Component Value Date   ALT 27 11/02/2019   AST 28 11/02/2019   ALKPHOS 77 11/02/2019   BILITOT 0.3 11/02/2019    Lab Results  Component Value Date   TSH 1.100 01/13/2018     Assessment:   No diagnosis found.   Plan:  Blood tests: {blood tests:13147}. Breast self exam technique reviewed and patient encouraged to perform self-exam monthly. Contraception: {contraceptive methods:5051}. Discussed healthy lifestyle modifications. Mammogram {discussed/ordered:14545} Pap smear {discussed/ordered:14545}. COVID vaccination status: Follow up in 1 year for annual exam   Landis Gandy, CMA [facility]

## 2022-05-07 ENCOUNTER — Other Ambulatory Visit (HOSPITAL_COMMUNITY)
Admission: RE | Admit: 2022-05-07 | Discharge: 2022-05-07 | Disposition: A | Discharge status: Home / Self Care | Payer: BC Managed Care – PPO | Source: Ambulatory Visit | Attending: Obstetrics and Gynecology | Admitting: Obstetrics and Gynecology

## 2022-05-07 ENCOUNTER — Encounter: Payer: Self-pay | Admitting: Obstetrics and Gynecology

## 2022-05-07 ENCOUNTER — Ambulatory Visit (INDEPENDENT_AMBULATORY_CARE_PROVIDER_SITE_OTHER): Payer: BC Managed Care – PPO | Admitting: Obstetrics and Gynecology

## 2022-05-07 VITALS — BP 99/66 | HR 91 | Resp 16 | Ht 67.0 in | Wt 211.3 lb

## 2022-05-07 DIAGNOSIS — Z01419 Encounter for gynecological examination (general) (routine) without abnormal findings: Secondary | ICD-10-CM | POA: Insufficient documentation

## 2022-05-07 DIAGNOSIS — E785 Hyperlipidemia, unspecified: Secondary | ICD-10-CM

## 2022-05-07 DIAGNOSIS — F32A Depression, unspecified: Secondary | ICD-10-CM

## 2022-05-07 DIAGNOSIS — Z124 Encounter for screening for malignant neoplasm of cervix: Secondary | ICD-10-CM | POA: Diagnosis present

## 2022-05-07 DIAGNOSIS — E669 Obesity, unspecified: Secondary | ICD-10-CM | POA: Diagnosis not present

## 2022-05-07 DIAGNOSIS — F419 Anxiety disorder, unspecified: Secondary | ICD-10-CM

## 2022-05-07 DIAGNOSIS — F3281 Premenstrual dysphoric disorder: Secondary | ICD-10-CM

## 2022-05-07 DIAGNOSIS — N920 Excessive and frequent menstruation with regular cycle: Secondary | ICD-10-CM

## 2022-05-07 NOTE — Addendum Note (Signed)
Addended by: Tommie Raymond on: 05/07/2022 11:43 AM   Modules accepted: Orders

## 2022-05-07 NOTE — Patient Instructions (Addendum)

## 2022-05-09 LAB — CYTOLOGY - PAP
Comment: NEGATIVE
Diagnosis: NEGATIVE
High risk HPV: NEGATIVE

## 2022-06-19 ENCOUNTER — Encounter: Payer: Self-pay | Admitting: Dietician

## 2022-06-19 ENCOUNTER — Encounter: Payer: BC Managed Care – PPO | Attending: Internal Medicine | Admitting: Dietician

## 2022-06-19 VITALS — Ht 67.0 in | Wt 215.6 lb

## 2022-06-19 DIAGNOSIS — Z6833 Body mass index (BMI) 33.0-33.9, adult: Secondary | ICD-10-CM | POA: Diagnosis not present

## 2022-06-19 DIAGNOSIS — E782 Mixed hyperlipidemia: Secondary | ICD-10-CM

## 2022-06-19 DIAGNOSIS — E669 Obesity, unspecified: Secondary | ICD-10-CM | POA: Diagnosis not present

## 2022-06-19 NOTE — Patient Instructions (Signed)
Control portions of meats (palm size)  and starchy foods (fist size or less); use low carb veggies to fill the plate and help with fulness.  Great job making low fat and healthy food choices overall! Continue with regular exercise/ active time during most days of the week.

## 2022-06-19 NOTE — Progress Notes (Signed)
Medical Nutrition Therapy: Visit start time: 1630  end time: 1730  Assessment:   Referral Diagnosis: overweight/ obesity Other medical history/ diagnoses: hyperlipidemia -- triglycerides Psychosocial issues/ stress concerns: history of anxiety/ depression  Medications, supplements: reconciled list in medical record   Preferred learning method:  Auditory Visual Hands-on    Current weight: 215.6lbs Height: 5'7" BMI: 33.77 Patient's personal weight goal: 160lbs  Progress and evaluation:  Did weight watchers about 10 years ago and was successful, stopped when pregnant and after pregnancies was not able to maintain weight loss  Worked on diet changes for about 1 year to lower cholesterol, but unable to reach goal, so has started on pravastatin. She continues to follow a low fat, low cholesterol eating pattern, and incorporates whole grains and vegetables often. Recent labwork (02/07/22) indicates: total cholesterol 272, HDL 40.7, LDL 176, triglcyerides 278. HbA1C 5.2% Does not like any seafood. Likes some fruits but prefers vegetables over fruits. Food allergies: some itchy mouth and nose with raw corn, avocado, watermelon, cantaloupe Feels that she is unable to feel satisfied when eating and often eats portions that are too large. Special diet practices: no Patient seeks help with weight loss that is sustainable, while controlling hunger/ satiety.    Dietary Intake:  Usual eating pattern includes 3 meals and 1-2 snacks per day. Dining out frequency: 2-3 meals per week. Who plans meals/ buys groceries? Self, spouse Who prepares meals? Self, spouse  Breakfast: workdays-- Nutrigrain bar or Kind bar; if home -- egg beaters occ with sm amount fat free cheese and sometimes toast Snack: goldfish/ crackers/ pretzels -- preschool snack Lunch: sometimes brings from home, sometimes at mother's home-- [place] sandwich; leftovers; yogurt with granola and berries; low cal frozen meal with added  sides Snack: usually none Supper: Mondays-- grill/ air fried chicken with olive oil and spices + veg zucchini/ broccioli/ canned veg + whole grain or jasmine rice; Tues -- spaghetti with 93-96% lean meat jar sauce, whole grain pasta or taco salad/ whole wheat low carb wrapwith lean meat, black beans/ refried beans, lettuce, light sour cream, fat free cheese; once a week hamburger/ veggie burter night no bun, boiled/ roasted cabbage, green beans or baked beans; chick fil a once a week salad/ grilled chicken wrap with salad Snack: mini hershey bar, 1-2 cookies 3-4 times a week Beverages: water 4-5 16oz bottles daily; sugar free lemonade no caffeine or carbonated beverages  Physical activity: treadmill, weights 45 minutes, 3-4 times a week   Intervention:   Nutrition Care Education:   Basic nutrition: basic food groups; appropriate nutrient balance; appropriate meal and snack schedule; general nutrition guidelines    Weight control: determining reasonable weight loss rate; importance of low sugar and low fat choices; portion control strategies including using small plates, measuring portions, using vegetables to fill plate/ stretch portions of starches; estimated energy needs for weight loss at 1400-1500 kcal, provided guidance for 45% CHO, 25% pro, 30% fat; discussed role of physical activity; discussed advantages/ disadvantages of tracking food intake; promoting satiety with use of low carb vegetables, fruits, high fiber foods, small portions of nuts/ seeds/ nut butters Hyperlipidemia:  healthy and unhealthy fats   Other intervention notes: Patient is making generally healthy food choices. She is motivated to continue. Established goals for additional change with input from patient.   Nutritional Diagnosis:  [place]-2.2 Altered nutrition-related laboratory As related to hyperlipidemia.  As evidenced by elevated total cholesterol, LDL, and triglycerides. [place]-3.3 Overweight/obesity As related to  history of excess calories  and inadequate physical activity, anxiety/ depression.  As evidenced by patient with current BMI of 33.77, working on diet and lifestyle changes to promote weight loss.   Education Materials given:  Designer, industrial/product with food lists, sample meal pattern Tips for Managing Food Cravings Visit summary with goals/ instructions to be viewed via patient portal   Learner/ who was taught:  Patient   Level of understanding: Unable to understand/ needs instruction   Demonstrated degree of understanding via:   Teach back Learning barriers: None  Willingness to learn/ readiness for change: Eager, change in progress   Monitoring and Evaluation:  Dietary intake, exercise, blood lipids, and body weight      follow up:  08/28/22 at 4:30pm

## 2022-08-28 ENCOUNTER — Ambulatory Visit: Payer: BC Managed Care – PPO | Admitting: Dietician

## 2022-09-09 ENCOUNTER — Encounter: Payer: Self-pay | Admitting: Physician Assistant

## 2022-09-09 ENCOUNTER — Telehealth: Payer: Self-pay | Admitting: Internal Medicine

## 2022-09-09 ENCOUNTER — Ambulatory Visit
Admission: RE | Admit: 2022-09-09 | Discharge: 2022-09-09 | Disposition: A | Discharge status: Home / Self Care | Payer: BC Managed Care – PPO | Source: Ambulatory Visit | Attending: Physician Assistant | Admitting: Physician Assistant

## 2022-09-09 ENCOUNTER — Other Ambulatory Visit: Payer: Self-pay | Admitting: Family Medicine

## 2022-09-09 ENCOUNTER — Ambulatory Visit
Admission: RE | Admit: 2022-09-09 | Discharge: 2022-09-09 | Disposition: A | Discharge status: Home / Self Care | Payer: BC Managed Care – PPO | Attending: Physician Assistant | Admitting: Physician Assistant

## 2022-09-09 ENCOUNTER — Ambulatory Visit: Payer: BC Managed Care – PPO | Admitting: Physician Assistant

## 2022-09-09 VITALS — BP 129/80 | HR 88 | Ht 68.0 in | Wt 210.0 lb

## 2022-09-09 DIAGNOSIS — Z87442 Personal history of urinary calculi: Secondary | ICD-10-CM | POA: Insufficient documentation

## 2022-09-09 DIAGNOSIS — R109 Unspecified abdominal pain: Secondary | ICD-10-CM

## 2022-09-09 DIAGNOSIS — N201 Calculus of ureter: Secondary | ICD-10-CM | POA: Diagnosis present

## 2022-09-09 DIAGNOSIS — M4300 Spondylolysis, site unspecified: Secondary | ICD-10-CM

## 2022-09-09 LAB — URINALYSIS, COMPLETE
Bilirubin, UA: NEGATIVE
Glucose, UA: NEGATIVE
Ketones, UA: NEGATIVE
Leukocytes,UA: NEGATIVE
Nitrite, UA: NEGATIVE
Protein,UA: NEGATIVE
RBC, UA: NEGATIVE
Specific Gravity, UA: 1.02 (ref 1.005–1.030)
Urobilinogen, Ur: 0.2 mg/dL (ref 0.2–1.0)
pH, UA: 7 (ref 5.0–7.5)

## 2022-09-09 LAB — MICROSCOPIC EXAMINATION

## 2022-09-09 NOTE — Telephone Encounter (Signed)
Error

## 2022-09-09 NOTE — Patient Instructions (Signed)
I placed a referral for you to see Dr. 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 If your pain becomes severe, please go to the [facility] for further evaluation.

## 2022-09-09 NOTE — Progress Notes (Signed)
09/09/2022 2:55 PM   Deborah Fields 22-May-1986 [id number]  CC: Chief Complaint  Patient presents with   Nephrolithiasis   HPI: Deborah Fields is a 36 y.o. female with PMH nephrolithiasis and recurrent UTI who presents today for evaluation of possible acute stone episode.   Today she reports a 1 day history of progressive right flank pain and nausea.  She denies fever, chills, or vomiting.  The pain is currently rated at 7/10 in intensity.  She has not been taking any medication to treat her symptoms.  She is s/p tubal ligation.  She does not have a history of back pain at baseline.  She is leaving town tomorrow morning for several days.  KUB today with no radiopaque ureteral stones.  Visualization is suboptimal due to stool burden.  She has 2 punctate phleboliths in the right hemipelvis which are seen on prior imaging.  I offered her renal ultrasound versus CT stone study today.  She elected to proceed with CT today and was sent from clinic for stat imaging.  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  No spinal alignment abnormality noted.  Patient denies lower extremity paresthesias, saddle anesthesia, or urinary or fecal incontinence.  In-office UA today pan negative; urine microscopy with many bacteria.   PMH: Past Medical History:  Diagnosis Date   Allergic rhinitis    Anemia    during pregnancy   Anxiety    managed by Psych   Asthma    Depression    managed by psych   Erosive esophagitis    seen on Colonoscopy   GERD (gastroesophageal reflux disease)    Headache    Heartburn    Hiatal hernia    shown on colonoscopy   IBS (irritable bowel syndrome)    per colonoscopy in Feb 2015   MRSA (methicillin resistant Staphylococcus aureus)    Panic disorder without agoraphobia    managed by Psych   Pneumonia 2016   PONV (postoperative nausea and vomiting)     Surgical History: Past Surgical History:   Procedure Laterality Date   CESAREAN SECTION     CESAREAN SECTION WITH BILATERAL TUBAL LIGATION Bilateral 10/30/2017   Procedure: CESAREAN SECTION WITH BILATERAL TUBAL LIGATION;  Surgeon: Hildred Laser, MD;  Location: [facility] ORS;  Service: Obstetrics;  Laterality: Bilateral;   COLONOSCOPY  Feb 2015   showed IBS, Hiatal Hernia, Erosive reflux esophagitis   ESOPHAGOGASTRODUODENOSCOPY     OVARIAN CYST REMOVAL Right    TONSILLECTOMY     TONSILLECTOMY      Home Medications:  Allergies as of 09/09/2022       Reactions   Sulfa Antibiotics Rash        Medication List        Accurate as of September 09, 2022  2:55 PM. If you have any questions, ask your nurse or doctor.          STOP taking these medications    Saxenda 18 MG/3ML Sopn Generic drug: Liraglutide -Weight Management Stopped by: Carman Ching, PA-C       TAKE these medications    clonazePAM 0.5 MG tablet Commonly known as: KLONOPIN Take 0.5 mg by mouth 2 (two) times daily as needed.   Dexilant 60 MG capsule Generic drug: dexlansoprazole TAKE 1 CAPSULE BY MOUTH EVERY DAY   DULoxetine 60 MG capsule Commonly known as: CYMBALTA Take 60 mg by mouth daily.   fenofibrate 160 MG tablet Take  160 mg by mouth daily.   fluticasone 50 MCG/ACT nasal spray Commonly known as: FLONASE USE 2 SPRAYS IN BOTH NOSTRILS ONCE A DAY   MAGNESIUM PO Take 1 tablet by mouth daily.   metoprolol tartrate 25 MG tablet Commonly known as: LOPRESSOR Take by mouth.   multivitamin tablet Take 1 tablet by mouth daily.   pravastatin 20 MG tablet Commonly known as: PRAVACHOL Take 1 tablet by mouth at bedtime.   sertraline 100 MG tablet Commonly known as: ZOLOFT Take 200 mg by mouth daily.   SOLUBLE FIBER/PROBIOTICS PO Take 1 capsule by mouth at bedtime.        Allergies:  Allergies  Allergen Reactions   Sulfa Antibiotics Rash    Family History: Family History  Problem Relation Age of Onset   Diabetes  Mother    Hypertension Mother    Hyperlipidemia Mother    COPD Mother    Heart disease Father    Hypertension Father    Hyperlipidemia Father    COPD Maternal Grandmother    COPD Maternal Grandfather    Cancer Paternal Grandfather        prostate   Stroke Neg Hx    Kidney disease Neg Hx    Bladder Cancer Neg Hx     Social History:   reports that she quit smoking about 15 years ago. Her smoking use included cigarettes. She has a 2.50 pack-year smoking history. She has never used smokeless tobacco. She reports that she does not drink alcohol and does not use drugs.  Physical Exam: BP 129/80   Pulse 88   Ht 5\' 8"  (1.727 m)   Wt 210 lb (95.3 kg)   BMI 31.93 kg/m   Constitutional:  Alert and oriented, no acute distress, nontoxic appearing HEENT: [place], AT Cardiovascular: No clubbing, cyanosis, or edema Respiratory: Normal respiratory effort, no increased work of breathing Skin: No rashes, bruises or suspicious lesions MSK: Bilateral 5/5 strength with flexion and extension of the hips, knees, and ankles. No gait abnormalities. Neurologic: Grossly intact, no focal deficits, moving all 4 extremities Psychiatric: Normal mood and affect  Laboratory Data: Results for orders placed or performed in visit on 09/09/22  Microscopic Examination   Urine  Result Value Ref Range   WBC, UA 0-5 0 - 5 /hpf   RBC, Urine 0-2 0 - 2 /hpf   Epithelial Cells (non renal) 0-10 0 - 10 /hpf   Bacteria, UA Many (A) None seen/Few  Urinalysis, Complete  Result Value Ref Range   Specific Gravity, UA 1.020 1.005 - 1.030   pH, UA 7.0 5.0 - 7.5   Color, UA Yellow Yellow   Appearance Ur Clear Clear   Leukocytes,UA Negative Negative   Protein,UA Negative Negative/Trace   Glucose, UA Negative Negative   Ketones, UA Negative Negative   RBC, UA Negative Negative   Bilirubin, UA Negative Negative   Urobilinogen, Ur 0.2 0.2 - 1.0 mg/dL   Nitrite, UA Negative Negative   Microscopic Examination See below:     Pertinent Imaging: STAT CT stone study, 09/09/2022: CLINICAL DATA:  Right-sided flank pain.   EXAM: CT ABDOMEN AND PELVIS WITHOUT CONTRAST   TECHNIQUE: Multidetector CT imaging of the abdomen and pelvis was performed following the standard protocol without IV contrast.   RADIATION DOSE REDUCTION: This exam was performed according to the departmental dose-optimization program which includes automated exposure control, adjustment of the mA and/or kV according to patient size and/or use of iterative reconstruction technique.   COMPARISON:  February 20, 2021   FINDINGS: Lower chest: No acute abnormality.   Hepatobiliary: No focal liver abnormality is seen. No gallstones, gallbladder wall thickening, or biliary dilatation.   Pancreas: Unremarkable. No pancreatic ductal dilatation or surrounding inflammatory changes.   Spleen: Normal in size without focal abnormality.   Adrenals/Urinary Tract: Adrenal glands are unremarkable. Kidneys are normal, without renal calculi, focal lesion, or hydronephrosis. Bladder is unremarkable.   Stomach/Bowel: Stomach is within normal limits. Appendix appears normal. No evidence of bowel wall thickening, distention, or inflammatory changes.   Vascular/Lymphatic: Aortic atherosclerosis. No enlarged abdominal or pelvic lymph nodes.   Reproductive: Uterus and bilateral adnexa are unremarkable.   Other: No abdominal wall hernia or abnormality. No abdominopelvic ascites.   Musculoskeletal: Right L5-S1 pars articularis defect. No alignment abnormality.   IMPRESSION: 1. No evidence of nephrolithiasis or obstructive uropathy. 2. Right L5-S1 pars articularis defect.   Aortic Atherosclerosis (ICD10-I70.0).     Electronically Signed   By: Fidela Salisbury M.D.   On: 09/09/2022 15:33  I personally reviewed the images referenced above and note no urolithiasis.  Assessment & Plan:   1. Flank pain with history of urolithiasis Benign UA,  negative KUB, negative CT stone. No acute stone episode, ok to follow up as needed. - Urinalysis, Complete - CT RENAL STONE STUDY; Future  2. Pars defect Identified on STAT CT today. No neuro abnormalities. Possibly responsible for right flank pain, but unclear given unknown chronicity. Will refer her to see Dr. Kayleen Memos at [facility] and counseled her to go to their Urgent Care for further evaluation if her pain becomes severe in the interim. - AMB referral to orthopedics   Return if symptoms worsen or fail to improve.  Debroah Loop, PA-C  [facility] [address] [place], [place] [address] [phone]

## 2022-09-24 ENCOUNTER — Encounter: Payer: Self-pay | Admitting: Dietician

## 2022-09-24 NOTE — Progress Notes (Signed)
Have not heard back from patient to reschedule her cancelled appointment from 08/28/22. Sent notification to referring provider.

## 2023-01-03 ENCOUNTER — Inpatient Hospital Stay: Payer: BC Managed Care – PPO

## 2023-01-03 ENCOUNTER — Observation Stay
Admission: EM | Admit: 2023-01-03 | Discharge: 2023-01-04 | Disposition: A | Discharge status: Home / Self Care | Payer: BC Managed Care – PPO | Attending: Internal Medicine | Admitting: Internal Medicine

## 2023-01-03 ENCOUNTER — Emergency Department: Payer: BC Managed Care – PPO

## 2023-01-03 ENCOUNTER — Encounter: Payer: Self-pay | Admitting: Emergency Medicine

## 2023-01-03 ENCOUNTER — Other Ambulatory Visit: Payer: Self-pay

## 2023-01-03 DIAGNOSIS — Z1152 Encounter for screening for COVID-19: Secondary | ICD-10-CM | POA: Diagnosis not present

## 2023-01-03 DIAGNOSIS — K2900 Acute gastritis without bleeding: Secondary | ICD-10-CM

## 2023-01-03 DIAGNOSIS — Z87891 Personal history of nicotine dependence: Secondary | ICD-10-CM | POA: Diagnosis not present

## 2023-01-03 DIAGNOSIS — Z79899 Other long term (current) drug therapy: Secondary | ICD-10-CM | POA: Insufficient documentation

## 2023-01-03 DIAGNOSIS — K853 Drug induced acute pancreatitis without necrosis or infection: Secondary | ICD-10-CM | POA: Diagnosis not present

## 2023-01-03 DIAGNOSIS — K449 Diaphragmatic hernia without obstruction or gangrene: Secondary | ICD-10-CM | POA: Diagnosis not present

## 2023-01-03 DIAGNOSIS — K859 Acute pancreatitis without necrosis or infection, unspecified: Principal | ICD-10-CM | POA: Insufficient documentation

## 2023-01-03 DIAGNOSIS — E876 Hypokalemia: Secondary | ICD-10-CM | POA: Insufficient documentation

## 2023-01-03 DIAGNOSIS — F419 Anxiety disorder, unspecified: Secondary | ICD-10-CM

## 2023-01-03 DIAGNOSIS — R1011 Right upper quadrant pain: Principal | ICD-10-CM

## 2023-01-03 DIAGNOSIS — F32A Depression, unspecified: Secondary | ICD-10-CM | POA: Diagnosis not present

## 2023-01-03 DIAGNOSIS — J45909 Unspecified asthma, uncomplicated: Secondary | ICD-10-CM | POA: Diagnosis not present

## 2023-01-03 DIAGNOSIS — K2970 Gastritis, unspecified, without bleeding: Secondary | ICD-10-CM | POA: Insufficient documentation

## 2023-01-03 DIAGNOSIS — R748 Abnormal levels of other serum enzymes: Secondary | ICD-10-CM

## 2023-01-03 DIAGNOSIS — R10811 Right upper quadrant abdominal tenderness: Secondary | ICD-10-CM | POA: Diagnosis present

## 2023-01-03 DIAGNOSIS — K219 Gastro-esophageal reflux disease without esophagitis: Secondary | ICD-10-CM

## 2023-01-03 LAB — CBC
HCT: 42.2 % (ref 36.0–46.0)
Hemoglobin: 14 g/dL (ref 12.0–15.0)
MCH: 28.9 pg (ref 26.0–34.0)
MCHC: 33.2 g/dL (ref 30.0–36.0)
MCV: 87 fL (ref 80.0–100.0)
Platelets: 288 10*3/uL (ref 150–400)
RBC: 4.85 MIL/uL (ref 3.87–5.11)
RDW: 11.9 % (ref 11.5–15.5)
WBC: 9 10*3/uL (ref 4.0–10.5)
nRBC: 0 % (ref 0.0–0.2)

## 2023-01-03 LAB — TRIGLYCERIDES: Triglycerides: 253 mg/dL — ABNORMAL HIGH (ref <150)

## 2023-01-03 LAB — COMPREHENSIVE METABOLIC PANEL
ALT: 62 U/L — ABNORMAL HIGH (ref 0–44)
AST: 59 U/L — ABNORMAL HIGH (ref 15–41)
Albumin: 4.5 g/dL (ref 3.5–5.0)
Alkaline Phosphatase: 49 U/L (ref 38–126)
Anion gap: 11 (ref 5–15)
BUN: 21 mg/dL — ABNORMAL HIGH (ref 6–20)
CO2: 20 mmol/L — ABNORMAL LOW (ref 22–32)
Calcium: 8.7 mg/dL — ABNORMAL LOW (ref 8.9–10.3)
Chloride: 108 mmol/L (ref 98–111)
Creatinine, Ser: 0.84 mg/dL (ref 0.44–1.00)
GFR, Estimated: >60 mL/min (ref >60)
Glucose, Bld: 120 mg/dL — ABNORMAL HIGH (ref 70–99)
Potassium: 3.7 mmol/L (ref 3.5–5.1)
Sodium: 139 mmol/L (ref 135–145)
Total Bilirubin: 0.8 mg/dL (ref 0.3–1.2)
Total Protein: 7.6 g/dL (ref 6.5–8.1)

## 2023-01-03 LAB — URINALYSIS, ROUTINE W REFLEX MICROSCOPIC
Bilirubin Urine: NEGATIVE
Glucose, UA: NEGATIVE mg/dL
Hgb urine dipstick: NEGATIVE
Ketones, ur: NEGATIVE mg/dL
Leukocytes,Ua: NEGATIVE
Nitrite: NEGATIVE
Protein, ur: NEGATIVE mg/dL
Specific Gravity, Urine: 1.018 (ref 1.005–1.030)
pH: 6 (ref 5.0–8.0)

## 2023-01-03 LAB — RESP PANEL BY RT-PCR (RSV, FLU A&B, COVID)  RVPGX2
Influenza A by PCR: NEGATIVE
Influenza B by PCR: NEGATIVE
Resp Syncytial Virus by PCR: NEGATIVE
SARS Coronavirus 2 by RT PCR: NEGATIVE

## 2023-01-03 LAB — MAGNESIUM: Magnesium: 2 mg/dL (ref 1.7–2.4)

## 2023-01-03 LAB — LIPASE, BLOOD: Lipase: 336 U/L — ABNORMAL HIGH (ref 11–51)

## 2023-01-03 LAB — POC URINE PREG, ED: Preg Test, Ur: NEGATIVE

## 2023-01-03 MED ORDER — TECHNETIUM TC 99M MEBROFENIN IV KIT
5.3600 | PACK | Freq: Once | INTRAVENOUS | Status: AC | PRN
  Administered 2023-01-03: 5.36 via INTRAVENOUS

## 2023-01-03 MED ORDER — ENOXAPARIN SODIUM 60 MG/0.6ML IJ SOSY
0.5000 mg/kg | PREFILLED_SYRINGE | INTRAMUSCULAR | Status: DC
  Administered 2023-01-03: 50 mg via SUBCUTANEOUS
  Filled 2023-01-03: qty 0.6

## 2023-01-03 MED ORDER — DULOXETINE HCL 60 MG PO CPEP
60.0000 mg | ORAL_CAPSULE | Freq: Every day | ORAL | Status: DC
  Administered 2023-01-04: 60 mg via ORAL
  Filled 2023-01-03: qty 1

## 2023-01-03 MED ORDER — SODIUM CHLORIDE 0.9 % IV BOLUS
1000.0000 mL | Freq: Once | INTRAVENOUS | Status: AC
  Administered 2023-01-03: 1000 mL via INTRAVENOUS

## 2023-01-03 MED ORDER — SCOPOLAMINE 1 MG/3DAYS TD PT72
1.0000 | MEDICATED_PATCH | TRANSDERMAL | Status: DC
  Administered 2023-01-03: 1.5 mg via TRANSDERMAL
  Filled 2023-01-03: qty 1

## 2023-01-03 MED ORDER — PANTOPRAZOLE SODIUM 40 MG IV SOLR
40.0000 mg | INTRAVENOUS | Status: DC
  Administered 2023-01-03: 40 mg via INTRAVENOUS
  Filled 2023-01-03: qty 10

## 2023-01-03 MED ORDER — MORPHINE SULFATE (PF) 2 MG/ML IV SOLN
2.0000 mg | INTRAVENOUS | Status: DC | PRN
  Filled 2023-01-03: qty 1

## 2023-01-03 MED ORDER — ONDANSETRON HCL 4 MG/2ML IJ SOLN
4.0000 mg | Freq: Once | INTRAMUSCULAR | Status: DC
  Filled 2023-01-03: qty 2

## 2023-01-03 MED ORDER — SERTRALINE HCL 50 MG PO TABS
200.0000 mg | ORAL_TABLET | Freq: Every day | ORAL | Status: DC
  Administered 2023-01-04: 200 mg via ORAL
  Filled 2023-01-03: qty 4

## 2023-01-03 MED ORDER — LORAZEPAM 2 MG/ML IJ SOLN
0.5000 mg | Freq: Once | INTRAMUSCULAR | Status: AC
  Administered 2023-01-03: 0.5 mg via INTRAVENOUS
  Filled 2023-01-03: qty 1

## 2023-01-03 MED ORDER — METOPROLOL TARTRATE 25 MG PO TABS
25.0000 mg | ORAL_TABLET | Freq: Two times a day (BID) | ORAL | Status: DC
  Administered 2023-01-03 – 2023-01-04 (×2): 25 mg via ORAL
  Filled 2023-01-03 (×2): qty 1

## 2023-01-03 MED ORDER — MORPHINE SULFATE (PF) 4 MG/ML IV SOLN: 4.0000 mg | INTRAVENOUS | Status: DC | PRN

## 2023-01-03 MED ORDER — ACETAMINOPHEN 650 MG RE SUPP: 650.0000 mg | Freq: Four times a day (QID) | RECTAL | Status: DC | PRN

## 2023-01-03 MED ORDER — ACETAMINOPHEN 325 MG PO TABS
650.0000 mg | ORAL_TABLET | Freq: Four times a day (QID) | ORAL | Status: DC | PRN
  Administered 2023-01-03 – 2023-01-04 (×2): 650 mg via ORAL
  Filled 2023-01-03 (×2): qty 2

## 2023-01-03 MED ORDER — METOCLOPRAMIDE HCL 5 MG/ML IJ SOLN
10.0000 mg | Freq: Three times a day (TID) | INTRAMUSCULAR | Status: AC
  Administered 2023-01-03 – 2023-01-04 (×2): 10 mg via INTRAVENOUS
  Filled 2023-01-03 (×3): qty 2

## 2023-01-03 MED ORDER — IOHEXOL 300 MG/ML  SOLN
100.0000 mL | Freq: Once | INTRAMUSCULAR | Status: AC | PRN
  Administered 2023-01-03: 100 mL via INTRAVENOUS

## 2023-01-03 MED ORDER — CLONAZEPAM 0.5 MG PO TABS: 0.5000 mg | ORAL_TABLET | Freq: Two times a day (BID) | ORAL | Status: DC | PRN

## 2023-01-03 MED ORDER — LACTATED RINGERS IV SOLN: INTRAVENOUS | Status: DC

## 2023-01-03 NOTE — Assessment & Plan Note (Signed)
Continue sertraline and clonazepam

## 2023-01-03 NOTE — Assessment & Plan Note (Signed)
Thought to be medication induced and related to recent semaglutide administration Hold semaglutide Positive care with IV fluid hydration, antiemetics, IV PPI

## 2023-01-03 NOTE — H&P (Addendum)
History and Physical    Patient: Deborah Fields DVV:[id number] DOB: 1986-10-27 DOA: 01/03/2023 DOS: the patient was seen and examined on 01/03/2023 PCP: Gladstone Lighter, MD  Patient coming from: Home  Chief Complaint:  Chief Complaint  Patient presents with   Abdominal Pain   HPI: Deborah Fields is a 37 y.o. female with medical history significant for depression, hiatal hernia with GERD, anxiety disorder, history of IBS who presents to the ER via private vehicle for evaluation of sudden onset abdominal pain which started about 6 PM the day prior to admission. Patient describes right upper quadrant pain which started at about 6 PM, she rates her pain a 7 x 10 in intensity at its worst.  Pain is nonradiating and she denies having any relieving or aggravating factors.  It is associated with nausea and several episodes of emesis as well as diarrhea.  She denies recent antibiotic use but was started on semaglutide about a week ago for weight loss.  She denies having any hematemesis and denies NSAID use. Patient states that she has had intermittent episodes of abdominal discomfort associated with diarrhea and nausea over the last 1 year which she describes as flareups.  Unable to tell me any known aggravating factors. She denies having any fever or chills, she denies alcohol use, she denies having any chest pain, no shortness of breath, no leg swelling, no headache, no dizziness, no lightheadedness, no blurred vision no focal deficit. She was seen at the urgent care center and was referred to the ER because of tachycardia with heart rate of 145 bpm Abnormal labs include lipase 336, AST 59, ALT 62, Gallbladder ultrasound is negative for cholelithiasis and shows hepatic steatosis Minimal splenomegaly.  She received 2 L IV fluid bolus, a dose of IV lorazepam and morphine She will be referred to observation status for further evaluation.  Review of Systems: As mentioned in the history of present  illness. All other systems reviewed and are negative. Past Medical History:  Diagnosis Date   Allergic rhinitis    Anemia    during pregnancy   Anxiety    managed by Psych   Asthma    Depression    managed by psych   Erosive esophagitis    seen on Colonoscopy   GERD (gastroesophageal reflux disease)    Headache    Heartburn    Hiatal hernia    shown on colonoscopy   IBS (irritable bowel syndrome)    per colonoscopy in Feb 2015   MRSA (methicillin resistant Staphylococcus aureus)    Panic disorder without agoraphobia    managed by Psych   Pneumonia 2016   PONV (postoperative nausea and vomiting)    Past Surgical History:  Procedure Laterality Date   CESAREAN SECTION     CESAREAN SECTION WITH BILATERAL TUBAL LIGATION Bilateral 10/30/2017   Procedure: CESAREAN SECTION WITH BILATERAL TUBAL LIGATION;  Surgeon: Rubie Maid, MD;  Location: [facility] ORS;  Service: Obstetrics;  Laterality: Bilateral;   COLONOSCOPY  Feb 2015   showed IBS, Hiatal Hernia, Erosive reflux esophagitis   ESOPHAGOGASTRODUODENOSCOPY     OVARIAN CYST REMOVAL Right    TONSILLECTOMY     TONSILLECTOMY     Social History:  reports that she quit smoking about 16 years ago. Her smoking use included cigarettes. She has a 2.50 pack-year smoking history. She has never used smokeless tobacco. She reports that she does not drink alcohol and does not use drugs.  Allergies  Allergen Reactions   Sulfa  Antibiotics Rash    Family History  Problem Relation Age of Onset   Diabetes Mother    Hypertension Mother    Hyperlipidemia Mother    COPD Mother    Heart disease Father    Hypertension Father    Hyperlipidemia Father    COPD Maternal Grandmother    COPD Maternal Grandfather    Cancer Paternal Grandfather        prostate   Stroke Neg Hx    Kidney disease Neg Hx    Bladder Cancer Neg Hx     Prior to Admission medications   Medication Sig Start Date End Date Taking? Authorizing Provider  clonazePAM  (KLONOPIN) 0.5 MG tablet Take 0.5 mg by mouth 2 (two) times daily as needed. 11/02/17   [provider]  DEXILANT 60 MG capsule TAKE 1 CAPSULE BY MOUTH EVERY DAY 07/18/21   Midge Minium, MD  DULoxetine (CYMBALTA) 60 MG capsule Take 60 mg by mouth daily.    [provider]  fenofibrate 160 MG tablet Take 160 mg by mouth daily. 01/16/21   [provider]  fluticasone (FLONASE) 50 MCG/ACT nasal spray USE 2 SPRAYS IN BOTH NOSTRILS ONCE A DAY 04/21/19   Steele Sizer, MD  MAGNESIUM PO Take 1 tablet by mouth daily.    [provider]  metoprolol tartrate (LOPRESSOR) 25 MG tablet Take by mouth. 05/26/22   [provider]  Multiple Vitamin (MULTIVITAMIN) tablet Take 1 tablet by mouth daily.    [provider]  pravastatin (PRAVACHOL) 20 MG tablet Take 1 tablet by mouth at bedtime. 06/09/22   [provider]  Probiotic Product (SOLUBLE FIBER/PROBIOTICS PO) Take 1 capsule by mouth at bedtime.    [provider]  sertraline (ZOLOFT) 100 MG tablet Take 200 mg by mouth daily.  06/09/15   [provider]    Physical Exam: Vitals:   01/03/23 1128 01/03/23 1129 01/03/23 1314  BP:  117/89 131/84  Pulse:  (!) 145 (!) 120  Resp:  20 18  Temp:  98.3 F (36.8 C)   SpO2:  97% 100%  Weight: 98.4 kg    Height: 5\' 7"  (1.702 m)     Physical Exam Vitals and nursing note reviewed.  Constitutional:      Appearance: She is well-developed.  HENT:     Head: Normocephalic.     Mouth/Throat:     Comments: dry Cardiovascular:     Rate and Rhythm: Tachycardia present.  Pulmonary:     Effort: Pulmonary effort is normal.     Breath sounds: Normal breath sounds.  Abdominal:     General: Abdomen is flat. Bowel sounds are normal.     Palpations: Abdomen is soft.     Tenderness: There is abdominal tenderness in the right upper quadrant.  Skin:    General: Skin is warm and dry.  Neurological:     General: No focal deficit present.      Mental Status: She is alert.  Psychiatric:        Mood and Affect: Mood normal.        Behavior: Behavior normal.     Data Reviewed: Relevant notes from primary care and specialist visits, past discharge summaries as available in EHR, including Care Everywhere. Prior diagnostic testing as pertinent to current admission diagnoses Updated medications and problem lists for reconciliation ED course, including vitals, labs, imaging, treatment and response to treatment Triage notes, nursing and pharmacy notes and ED provider's notes Notable results as  noted in HPI Labs reviewed.  Triglycerides 253, lipase 336, magnesium 2.0, sodium 139, potassium 3.7, chloride 108, bicarb 20, glucose 120, BUN 21, creatinine 0.84, calcium 8.7, total protein 7.6, albumin 4.5, AST 59, ALT 62, alkaline phosphatase 49, total bilirubin 0.8, white count 9.0, hemoglobin 14.0, hematocrit 42, platelet count 288 Respiratory viral panel is negative Twelve-lead EKG reviewed by me shows sinus tachycardia with nonspecific T wave changes There are no new results to review at this time.  Assessment and Plan: * Pancreatitis, acute Patient presents for evaluation of abdominal pain mostly in the right upper quadrant but has elevated lipase levels concerning for possible acute pancreatitis Acute pancreatitis is thought to be medication induced as patient was recently started on semaglutide for weight loss Hold semaglutide Supportive care with IV fluid hydration, n.p.o. status, pain control and antiemetics  Gastritis Thought to be medication induced and related to recent semaglutide administration Hold semaglutide Positive care with IV fluid hydration, antiemetics, IV PPI  Hiatal hernia with GERD Treatment as outlined in 2  Anxiety and depression Continue sertraline and clonazepam      Advance Care Planning:   Code Status: Full Code   Consults: None  Family Communication: Greater than 50% of time was spent discussing  patient's condition and plan of care with her at the bedside.  All questions and concerns have been addressed.  She verbalizes understanding and agrees with the plan.  Severity of Illness: The appropriate patient status for this patient is INPATIENT. Inpatient status is judged to be reasonable and necessary in order to provide the required intensity of service to ensure the patient's safety. The patient's presenting symptoms, physical exam findings, and initial radiographic and laboratory data in the context of their chronic comorbidities is felt to place them at high risk for further clinical deterioration. Furthermore, it is not anticipated that the patient will be medically stable for discharge from the hospital within 2 midnights of admission.   * I certify that at the point of admission it is my clinical judgment that the patient will require inpatient hospital care spanning beyond 2 midnights from the point of admission due to high intensity of service, high risk for further deterioration and high frequency of surveillance required.*  Author: Collier Bullock, MD 01/03/2023 5:50 PM  For on call review [email or web address].

## 2023-01-03 NOTE — ED Triage Notes (Signed)
Pt via POV from home. Pt c/o RUQ pain, nausea, vomiting since yesterday pt has a hx of gallbladder insufficiency but still has gall bladder. Pt sent over from [facility]. HR 145, pt took 12.5 of Metoprolol around 45 mins PTA. Pt is A&Ox4 and NAD

## 2023-01-03 NOTE — Progress Notes (Signed)
PHARMACIST - PHYSICIAN COMMUNICATION  CONCERNING:  Enoxaparin (Lovenox) for DVT Prophylaxis    RECOMMENDATION: Patient was prescribed enoxaprin 40mg  q24 hours for VTE prophylaxis.   Filed Weights   01/03/23 1128  Weight: 98.4 kg (217 lb)    Body mass index is 33.99 kg/m.  Estimated Creatinine Clearance: 109.4 mL/min (by C-G formula based on SCr of 0.84 mg/dL).   Based on [place] patient is candidate for enoxaparin 0.5mg /kg TBW SQ every 24 hours based on BMI being >30.   DESCRIPTION: Pharmacy has adjusted enoxaparin dose per [facility] policy.  Patient is now receiving enoxaparin 0.5 mg/kg every 24 hours    Glean Salvo, PharmD, BCPS Clinical Pharmacist  01/03/2023 5:20 PM

## 2023-01-03 NOTE — Assessment & Plan Note (Signed)
Patient presents for evaluation of abdominal pain mostly in the right upper quadrant but has elevated lipase levels concerning for possible acute pancreatitis Acute pancreatitis is thought to be medication induced as patient was recently started on semaglutide for weight loss Hold semaglutide Supportive care with IV fluid hydration, n.p.o. status, pain control and antiemetics

## 2023-01-03 NOTE — ED Provider Notes (Signed)
[facility] Provider Note    Event Date/Time   First MD Initiated Contact with Patient 01/03/23 1258     (approximate)   History   Abdominal Pain   HPI  Deborah Fields is a 37 y.o. female with a history of "gallbladder dysfunction "presents to the ER for evaluation of acute right upper quadrant pain associated fever to 100.3 this morning.  Pain started last night.  Is not having associated nausea vomiting.  No chest pain or shortness of breath.  Denies any dysuria no diarrhea.  Has been worked up for rectal quadrant pain in the past states that she was told that she does not have gallstones.     Physical Exam   Triage Vital Signs: ED Triage Vitals  Enc Vitals Group     BP 01/03/23 1129 117/89     Pulse Rate 01/03/23 1129 (!) 145     Resp 01/03/23 1129 20     Temp 01/03/23 1129 98.3 F (36.8 C)     Temp src --      SpO2 01/03/23 1129 97 %     Weight 01/03/23 1128 217 lb (98.4 kg)     Height 01/03/23 1128 5\' 7"  (1.702 m)     Head Circumference --      Peak Flow --      Pain Score 01/03/23 1128 7     Pain Loc --      Pain Edu? --      Excl. in [place]? --     Most recent vital signs: Vitals:   01/03/23 1129  BP: 117/89  Pulse: (!) 145  Resp: 20  Temp: 98.3 F (36.8 C)  SpO2: 97%     Constitutional: Alert  Eyes: Conjunctivae are normal.  Head: Atraumatic. Nose: No congestion/rhinnorhea. Mouth/Throat: Mucous membranes are moist.   Neck: Painless ROM.  Cardiovascular:   Good peripheral circulation. Respiratory: Normal respiratory effort.  No retractions.  Gastrointestinal: Soft with tenderness to palpation in the right upper quadrant no guarding or rebound. Musculoskeletal:  no deformity Neurologic:  MAE spontaneously. No gross focal neurologic deficits are appreciated.  Skin:  Skin is warm, dry and intact. No rash noted. Psychiatric: Mood and affect are normal. Speech and behavior are normal.    ED Results / Procedures /  Treatments   Labs (all labs ordered are listed, but only abnormal results are displayed) Labs Reviewed  LIPASE, BLOOD - Abnormal; Notable for the following components:      Result Value   Lipase 336 (*)    All other components within normal limits  COMPREHENSIVE METABOLIC PANEL - Abnormal; Notable for the following components:   CO2 20 (*)    Glucose, Bld 120 (*)    BUN 21 (*)    Calcium 8.7 (*)    AST 59 (*)    ALT 62 (*)    All other components within normal limits  CBC  MAGNESIUM  URINALYSIS, ROUTINE W REFLEX MICROSCOPIC  POC URINE PREG, ED     EKG  ED ECG REPORT I, Merlyn Lot, the attending physician, personally viewed and interpreted this ECG.   Date: 01/03/2023  EKG Time: 11:32  Rate: 130  Rhythm: sinus  Axis: normal  Intervals: prolonged qt  ST&T Change: no stemi, no depressions    RADIOLOGY Please see ED Course for my review and interpretation.  I personally reviewed all radiographic images ordered to evaluate for the above acute complaints and reviewed radiology reports and findings.  These findings were personally discussed with the patient.  Please see medical record for radiology report.    PROCEDURES:  Critical Care performed: No  Procedures   MEDICATIONS ORDERED IN ED: Medications  morphine (PF) 4 MG/ML injection 4 mg (has no administration in time range)  ondansetron (ZOFRAN) injection 4 mg (has no administration in time range)  sodium chloride 0.9 % bolus 1,000 mL (0 mLs Intravenous Stopped 01/03/23 1304)     IMPRESSION / MDM / ASSESSMENT AND PLAN / ED COURSE  I reviewed the triage vital signs and the nursing notes.                              Differential diagnosis includes, but is not limited to, lithiasis, cholangitis, pancreatitis, cholecystitis, colitis, diverticulitis, viral illness, foodborne illness  Patient presenting to the ER for evaluation of symptoms as described above.  Based on symptoms, risk factors and  considered above differential, this presenting complaint could reflect a potentially life-threatening illness therefore the patient will be placed on continuous pulse oximetry and telemetry for monitoring.  Laboratory evaluation will be sent to evaluate for the above complaints.  Patient declining any pain medication at this time.  Does have some nausea and EKG shows evidence of prolonged QT.  Will give dose of IV Ativan.  Will order CT imaging to evaluate for the above differential.    Clinical Course as of 01/03/23 1900  Sat Jan 03, 2023  1356 CT imaging on my review and interpretation without evidence of perforation or abscess will await formal radiology report. [PR]  6578 CT imaging without clear finding but patient does have right upper quadrant tenderness palpation concerning for gallbladder disease.  No history of stones.  In setting of elevated lipase is concern for infection particular report of temperature 100.3 at home with tachycardia 145. [PR]  1648 Persistent nausea and vomiting complicated by prolonged QT on EKG.  Will order scopolamine patch.  Ultrasound is without evidence of stone.  No gallbladder wall thickening.  This may be secondary to early pancreatitis not shown on CT but given her ill appearance fever at home tachycardia will consult hospitalist for admission. [PR]    Clinical Course User Index [PR] Merlyn Lot, MD      FINAL CLINICAL IMPRESSION(S) / ED DIAGNOSES   Final diagnoses:  Right upper quadrant abdominal pain  Elevated lipase     Rx / DC Orders   ED Discharge Orders     None        Note:  This document was prepared using Dragon voice recognition software and may include unintentional dictation errors.    Merlyn Lot, MD 01/03/23 1900

## 2023-01-03 NOTE — Assessment & Plan Note (Signed)
Treatment as outlined in 2 

## 2023-01-04 DIAGNOSIS — K219 Gastro-esophageal reflux disease without esophagitis: Secondary | ICD-10-CM

## 2023-01-04 DIAGNOSIS — K449 Diaphragmatic hernia without obstruction or gangrene: Secondary | ICD-10-CM | POA: Diagnosis not present

## 2023-01-04 DIAGNOSIS — K2900 Acute gastritis without bleeding: Secondary | ICD-10-CM | POA: Diagnosis not present

## 2023-01-04 DIAGNOSIS — K853 Drug induced acute pancreatitis without necrosis or infection: Secondary | ICD-10-CM | POA: Diagnosis not present

## 2023-01-04 DIAGNOSIS — K859 Acute pancreatitis without necrosis or infection, unspecified: Secondary | ICD-10-CM | POA: Diagnosis present

## 2023-01-04 DIAGNOSIS — F419 Anxiety disorder, unspecified: Secondary | ICD-10-CM | POA: Diagnosis not present

## 2023-01-04 LAB — MAGNESIUM: Magnesium: 1.9 mg/dL (ref 1.7–2.4)

## 2023-01-04 LAB — HEPATIC FUNCTION PANEL
ALT: 42 U/L (ref 0–44)
AST: 34 U/L (ref 15–41)
Albumin: 3.6 g/dL (ref 3.5–5.0)
Alkaline Phosphatase: 35 U/L — ABNORMAL LOW (ref 38–126)
Bilirubin, Direct: 0.1 mg/dL (ref 0.0–0.2)
Indirect Bilirubin: 0.5 mg/dL (ref 0.3–0.9)
Total Bilirubin: 0.6 mg/dL (ref 0.3–1.2)
Total Protein: 6.3 g/dL — ABNORMAL LOW (ref 6.5–8.1)

## 2023-01-04 LAB — LIPID PANEL
Cholesterol: 200 mg/dL (ref 0–200)
HDL: 27 mg/dL — ABNORMAL LOW (ref >40)
LDL Cholesterol: 106 mg/dL — ABNORMAL HIGH (ref 0–99)
Total CHOL/HDL Ratio: 7.4 RATIO
Triglycerides: 333 mg/dL — ABNORMAL HIGH (ref <150)
VLDL: 67 mg/dL — ABNORMAL HIGH (ref 0–40)

## 2023-01-04 LAB — BASIC METABOLIC PANEL
Anion gap: 7 (ref 5–15)
BUN: 16 mg/dL (ref 6–20)
CO2: 22 mmol/L (ref 22–32)
Calcium: 8.1 mg/dL — ABNORMAL LOW (ref 8.9–10.3)
Chloride: 108 mmol/L (ref 98–111)
Creatinine, Ser: 0.73 mg/dL (ref 0.44–1.00)
GFR, Estimated: >60 mL/min (ref >60)
Glucose, Bld: 105 mg/dL — ABNORMAL HIGH (ref 70–99)
Potassium: 3.2 mmol/L — ABNORMAL LOW (ref 3.5–5.1)
Sodium: 137 mmol/L (ref 135–145)

## 2023-01-04 LAB — CBC
HCT: 35.1 % — ABNORMAL LOW (ref 36.0–46.0)
Hemoglobin: 12 g/dL (ref 12.0–15.0)
MCH: 29.3 pg (ref 26.0–34.0)
MCHC: 34.2 g/dL (ref 30.0–36.0)
MCV: 85.6 fL (ref 80.0–100.0)
Platelets: 250 10*3/uL (ref 150–400)
RBC: 4.1 MIL/uL (ref 3.87–5.11)
RDW: 12.1 % (ref 11.5–15.5)
WBC: 4.3 10*3/uL (ref 4.0–10.5)
nRBC: 0 % (ref 0.0–0.2)

## 2023-01-04 LAB — HIV ANTIBODY (ROUTINE TESTING W REFLEX): HIV Screen 4th Generation wRfx: NONREACTIVE

## 2023-01-04 MED ORDER — POTASSIUM CHLORIDE CRYS ER 20 MEQ PO TBCR
40.0000 meq | EXTENDED_RELEASE_TABLET | Freq: Once | ORAL | Status: AC
  Administered 2023-01-04: 40 meq via ORAL
  Filled 2023-01-04: qty 2

## 2023-01-04 NOTE — Discharge Summary (Signed)
Physician Discharge Summary   Patient: Deborah Fields MRN: [id number] DOB: 19-Apr-1986  Admit date:     01/03/2023  Discharge date: 01/04/23  Discharge Physician: Delfino Lovett   PCP: Enid Baas, MD   Recommendations at discharge:   Follow-up with outpatient providers as requested  Discharge Diagnoses: Principal Problem:   Pancreatitis, acute Active Problems:   Gastritis   Anxiety and depression   Hiatal hernia with GERD   Acute pancreatitis  Hospital Course: Assessment and Plan: * Pancreatitis, acute Gastritis thought to be medication induced as patient was recently started on semaglutide for weight loss -suspected semaglutide (WEGOVY) induced pancreatitis/gastritis? -Since stopping.  Patient's symptoms have completely resolved.  She tolerated diet and requesting discharge.  Anxiety and depression POTS? -Patient reports history of occasional dizzy spell and tachycardia -followed by Dr. Gwen Pounds Prolonged QTc 573 ms while in ED  - rechecked EKG before D/C today and is down 482 ms -Follow-up with Dr Gwen Pounds as an outpatient -Could consider adjusting her psych medication especially Zoloft as this could prolong QTc -Patient follows with Dr. Maryruth Bun with psychiatry and has appointment scheduled with her tomorrow on 1/22.  Will let her manage her psych medicine as patient has been on this medication for many years  Hypokalemia Repleted  Obesity She is still interested in Wt loss drug - I've asked her, She should discuss with her PCP Dr Nemiah Commander before restarting (same or different) .   Possible gallbladder disease -Seen by Dr. Tonna Boehringer as an outpatient -She had low grade fever, RUQ pain, elevated - lipase 336, AST 59, ALT 62 in ED but CT abd, HIDA neg, and LFTs normalized today.  -She is not interested in any inpatient evaluation.  Will let her follow-up with Dr. Tonna Boehringer as an outpatient         Disposition: Home Diet recommendation:  Discharge Diet Orders (From  admission, onward)     Start     Ordered   01/04/23 0000  Diet - low sodium heart healthy        01/04/23 1316           Carb modified diet DISCHARGE MEDICATION: Allergies as of 01/04/2023       Reactions   Sulfa Antibiotics Rash        Medication List     STOP taking these medications    Semaglutide-Weight Management 0.25 MG/0.5ML Soaj       TAKE these medications    clonazePAM 0.5 MG tablet Commonly known as: KLONOPIN Take 0.5 mg by mouth daily as needed for anxiety.   Dexilant 60 MG capsule Generic drug: dexlansoprazole TAKE 1 CAPSULE BY MOUTH EVERY DAY What changed:  how much to take when to take this   DULoxetine 60 MG capsule Commonly known as: CYMBALTA Take 60 mg by mouth every evening.   fenofibrate 160 MG tablet Take 160 mg by mouth every evening.   metoprolol tartrate 25 MG tablet Commonly known as: LOPRESSOR Take 12.5-25 mg by mouth daily as needed (heartrate control).   pravastatin 40 MG tablet Commonly known as: PRAVACHOL Take 40 mg by mouth at bedtime.   sertraline 100 MG tablet Commonly known as: ZOLOFT Take 200 mg by mouth at bedtime.   sucralfate 1 g tablet Commonly known as: CARAFATE Take 1 g by mouth 4 (four) times daily as needed (GI symptoms).        Follow-up Information     Enid Baas, MD. Schedule an appointment as soon as possible for a visit  in 3 day(s).   Specialty: Internal Medicine Why: [facility] Discharge F/UP Contact information: [address] [place] [address] [phone]         Darliss Ridgel, MD. Schedule an appointment as soon as possible for a visit on 01/05/2023.   Specialty: Psychiatry Why: as scheduled Contact information: [address] [place] [address] [phone]                Discharge Exam: Filed Weights   01/03/23 1128  Weight: 1.64 kg   37 year old female lying in the bed comfortably without any acute distress Lungs clear to  auscultation bilaterally Cardio regular rate and rhythm, no murmur rubs or gallop Abdomen soft, benign Neuro alert and awake, nonfocal Psych: Seems quite emotional.  Crying easily while talking with someone on the phone (she shared her kids were calling and wanting her home) Skin no rash or lesion  Condition at discharge: good  The results of significant diagnostics from this hospitalization (including imaging, microbiology, ancillary and laboratory) are listed below for reference.   Imaging Studies: NM Hepatobiliary Liver Func  Result Date: 01/03/2023 CLINICAL DATA:  Nausea, right upper quadrant pain, acalculous cholecystitis suspected, intermittent right upper quadrant pain for 1 year EXAM: NUCLEAR MEDICINE HEPATOBILIARY IMAGING TECHNIQUE: Sequential images of the abdomen were obtained out to 60 minutes following intravenous administration of radiopharmaceutical. RADIOPHARMACEUTICALS:  5.36 mCi Tc-52m  Choletec IV COMPARISON:  Right upper quadrant ultrasound, 01/03/2023 FINDINGS: Prompt uptake and biliary excretion of activity by the liver is seen. Gallbladder activity is visualized, consistent with patency of cystic duct. Biliary activity passes into small bowel, consistent with patent common bile duct. IMPRESSION: The cystic and common bile ducts are patent. Electronically Signed   By: Jearld Lesch M.D.   On: 01/03/2023 18:40   US ABDOMEN LIMITED RUQ (LIVER/GB)  Result Date: 01/03/2023 CLINICAL DATA:  Right upper quadrant pain EXAM: ULTRASOUND ABDOMEN LIMITED RIGHT UPPER QUADRANT COMPARISON:  CT abdomen and pelvis 01/03/2023 FINDINGS: Gallbladder: No gallstones or wall thickening visualized. No sonographic Murphy sign noted by sonographer. Common bile duct: Diameter: 2.9 mm Liver: No focal lesion identified. Increased in parenchymal echogenicity. Portal vein is patent on color Doppler imaging with normal direction of blood flow towards the liver. Other: None. IMPRESSION: Hepatic steatosis.  Electronically Signed   By: Darliss Cheney M.D.   On: 01/03/2023 16:11   CT ABDOMEN PELVIS W CONTRAST  Result Date: 01/03/2023 CLINICAL DATA:  Acute pancreatitis, severe RIGHT upper quadrant pain, nausea, and vomiting since yesterday, history of gallbladder disease EXAM: CT ABDOMEN AND PELVIS WITH CONTRAST TECHNIQUE: Multidetector CT imaging of the abdomen and pelvis was performed using the standard protocol following bolus administration of intravenous contrast. RADIATION DOSE REDUCTION: This exam was performed according to the departmental dose-optimization program which includes automated exposure control, adjustment of the mA and/or kV according to patient size and/or use of iterative reconstruction technique. CONTRAST:  OMNIPAQUE IOHEXOL 300 MG/ML SOLN IV. No oral contrast. COMPARISON:  09/09/2022 FINDINGS: Lower chest: Lung bases clear Hepatobiliary: Gallbladder and liver normal appearance. No biliary dilatation. Pancreas: Normal appearance. No pancreatic mass, calcification, ductal dilatation, fluid collection or surrounding edema. Spleen: Prominent size 14.2 x 4.7 x 13.1 cm (volume = 460 cm^3). No focal abnormality. Small splenule at medial spleen. Adrenals/Urinary Tract: Adrenal glands, kidneys, ureters, and bladder normal appearance Stomach/Bowel: Normal appendix. Stomach and bowel loops normal appearance. Vascular/Lymphatic: Vascular structures patent.  No adenopathy. Reproductive: Uterus and ovaries unremarkable for age Other: No free  air or free fluid. No hernia or inflammatory process. Musculoskeletal: Unremarkable IMPRESSION: Minimal splenomegaly. Otherwise negative exam. Electronically Signed   By: Lavonia Dana M.D.   On: 01/03/2023 14:11    Microbiology: Results for orders placed or performed during the hospital encounter of 01/03/23  Resp panel by RT-PCR (RSV, Flu A&B, Covid) Anterior Nasal Swab     Status: None   Collection Time: 01/03/23  3:39 PM   Specimen: Anterior Nasal Swab   Result Value Ref Range Status   SARS Coronavirus 2 by RT PCR NEGATIVE NEGATIVE Final    Comment: (NOTE) SARS-CoV-2 target nucleic acids are NOT DETECTED.  The SARS-CoV-2 RNA is generally detectable in upper respiratory specimens during the acute phase of infection. The lowest concentration of SARS-CoV-2 viral copies this assay can detect is 138 copies/mL. A negative result does not preclude SARS-Cov-2 infection and should not be used as the sole basis for treatment or other patient management decisions. A negative result may occur with  improper specimen collection/handling, submission of specimen other than nasopharyngeal swab, presence of viral mutation(s) within the areas targeted by this assay, and inadequate number of viral copies(<138 copies/mL). A negative result must be combined with clinical observations, patient history, and epidemiological information. The expected result is Negative.  Fact Sheet for Patients:  EntrepreneurPulse.com.au  Fact Sheet for Healthcare Providers:  IncredibleEmployment.be  This test is no t yet approved or cleared by the [place] FDA and  has been authorized for detection and/or diagnosis of SARS-CoV-2 by FDA under an Emergency Use Authorization (EUA). This EUA will remain  in effect (meaning this test can be used) for the duration of the COVID-19 declaration under Section 564(b)(1) of the Act, 21 U.S.C.section 360bbb-3(b)(1), unless the authorization is terminated  or revoked sooner.       Influenza A by PCR NEGATIVE NEGATIVE Final   Influenza B by PCR NEGATIVE NEGATIVE Final    Comment: (NOTE) The Xpert Xpress SARS-CoV-2/FLU/RSV plus assay is intended as an aid in the diagnosis of influenza from Nasopharyngeal swab specimens and should not be used as a sole basis for treatment. Nasal washings and aspirates are unacceptable for Xpert Xpress SARS-CoV-2/FLU/RSV testing.  Fact Sheet for  Patients: EntrepreneurPulse.com.au  Fact Sheet for Healthcare Providers: IncredibleEmployment.be  This test is not yet approved or cleared by the [place] FDA and has been authorized for detection and/or diagnosis of SARS-CoV-2 by FDA under an Emergency Use Authorization (EUA). This EUA will remain in effect (meaning this test can be used) for the duration of the COVID-19 declaration under Section 564(b)(1) of the Act, 21 U.S.C. section 360bbb-3(b)(1), unless the authorization is terminated or revoked.     Resp Syncytial Virus by PCR NEGATIVE NEGATIVE Final    Comment: (NOTE) Fact Sheet for Patients: EntrepreneurPulse.com.au  Fact Sheet for Healthcare Providers: IncredibleEmployment.be  This test is not yet approved or cleared by the [place] FDA and has been authorized for detection and/or diagnosis of SARS-CoV-2 by FDA under an Emergency Use Authorization (EUA). This EUA will remain in effect (meaning this test can be used) for the duration of the COVID-19 declaration under Section 564(b)(1) of the Act, 21 U.S.C. section 360bbb-3(b)(1), unless the authorization is terminated or revoked.  Performed at [facility], [address] [place], [place] [address]     Labs: CBC: Recent Labs  Lab 01/03/23 1144 01/04/23 0438  WBC 9.0 4.3  HGB 14.0 12.0  HCT 42.2 35.1*  MCV 87.0 85.6  PLT 288 250  Basic Metabolic Panel: Recent Labs  Lab 01/03/23 1144 01/04/23 0438  NA 139 137  K 3.7 3.2*  CL 108 108  CO2 20* 22  GLUCOSE 120* 105*  BUN 21* 16  CREATININE 0.84 0.73  CALCIUM 8.7* 8.1*  MG 2.0 1.9   Liver Function Tests: Recent Labs  Lab 01/03/23 1144 01/04/23 0438  AST 59* 34  ALT 62* 42  ALKPHOS 49 35*  BILITOT 0.8 0.6  PROT 7.6 6.3*  ALBUMIN 4.5 3.6   CBG: No results for input(s): "GLUCAP" in the last 168 hours.  Discharge time spent: greater than 30  minutes.  Signed: Max Sane, MD [facility] 01/04/2023

## 2023-01-04 NOTE — Plan of Care (Signed)

## 2023-01-09 ENCOUNTER — Ambulatory Visit: Payer: Self-pay | Admitting: Surgery

## 2023-01-09 ENCOUNTER — Encounter
Admission: RE | Admit: 2023-01-09 | Discharge: 2023-01-09 | Disposition: A | Discharge status: Home / Self Care | Payer: BC Managed Care – PPO | Source: Ambulatory Visit | Attending: Surgery | Admitting: Surgery

## 2023-01-09 ENCOUNTER — Encounter: Payer: Self-pay | Admitting: Surgery

## 2023-01-09 ENCOUNTER — Other Ambulatory Visit: Payer: Self-pay

## 2023-01-09 DIAGNOSIS — Z01812 Encounter for preprocedural laboratory examination: Secondary | ICD-10-CM

## 2023-01-09 HISTORY — DX: Personal history of urinary calculi: Z87.442

## 2023-01-09 HISTORY — DX: Tachycardia, unspecified: R00.0

## 2023-01-09 NOTE — H&P (View-Only) (Signed)
Subjective:   CC: Biliary colic [W09.81]  HPI: Deborah Fields is a 37 y.o. female who was referred by Benjamine Sprague, DO for evaluation of above CC. Symptoms were first noted 1 year ago. 3-4 episodes/mo Pain is sharp, right upper quadrant to right shoulder pain, without radiation. Lasts a few hours then resolves. Associated with nausea, pale loose stool, exacerbated by food consumption.  Past Medical History: has a past medical history of Allergic rhinitis due to allergen, Arrhythmia (05/15/2021), Generalized anxiety disorder, GERD (gastroesophageal reflux disease), Hyperlipidemia, Major depressive disorder, and Pneumonia.  Past Surgical History: has a past surgical history that includes Cesarean section; Tonsillectomy; Plantar fasciectomy (Left, 09/2020); egd (09/04/2021); and Tubal ligation (10/30/2017).  Family History: family history includes Alzheimer's disease in her maternal grandmother; Anxiety in her father and sister; Asthma in her mother; COPD in her mother; Coronary Artery Disease (Blocked arteries around heart) in her father and mother; Depression in her father, mother, and sister; Diabetes in her mother; Diabetes type II in her mother; Heart disease in her father; High blood pressure (Hypertension) in her father and mother; Hyperlipidemia (Elevated cholesterol) in her father and mother; Obesity in her mother; Osteoporosis (Thinning of bones) in her mother; Thyroid disease in her mother.  Social History: reports that she has quit smoking. She has been exposed to tobacco smoke. She has never used smokeless tobacco. She reports that she does not drink alcohol and does not use drugs.  Current Medications: has a current medication list which includes the following prescription(s): clonazepam, dexlansoprazole, duloxetine, fenofibrate, hydrocortisone, magnesium oxide, metoprolol tartrate, multivitamin, pravastatin, semaglutide, sertraline, sucralfate, and albuterol.  Allergies:  Allergies as of  01/05/2023 - Reviewed 01/05/2023  Allergen Reaction Noted  Sulfa (sulfonamide antibiotics) Hives and Rash 04/24/2015   ROS:  A 15 point review of systems was performed and pertinent positives and negatives noted in HPI   Objective:    BP 99/62  Pulse 75  Ht 170.2 cm (5\' 7" )  Wt 99.3 kg (219 lb)  LMP 12/10/2022 (Exact Date)  BMI 34.30 kg/m   Constitutional : No distress, cooperative, alert  Lymphatics/Throat: Supple with no lymphadenopathy  Respiratory: Clear to auscultation bilaterally  Cardiovascular: Regular rate and rhythm  Gastrointestinal: Soft, non-tender, non-distended, no organomegaly.  Musculoskeletal: Steady gait and movement  Skin: Cool and moist, no surgical scars  Psychiatric: Normal affect, non-agitated, not confused    LABS:  n/a   RADS: CLINICAL DATA: Right upper quadrant abdominal pain and nausea.   EXAM:  ULTRASOUND ABDOMEN LIMITED RIGHT UPPER QUADRANT   COMPARISON: CT abdomen pelvis dated 02/20/2021.   FINDINGS:  Gallbladder:   No gallstones or wall thickening visualized. No sonographic Murphy  sign noted by sonographer.   Common bile duct:   Diameter: 4 mm   Liver:   No focal lesion identified. Within normal limits in parenchymal  echogenicity. Portal vein is patent on color Doppler imaging with  normal direction of blood flow towards the liver.   Other: None.   IMPRESSION:  Unremarkable right upper quadrant ultrasound.   Electronically Signed  By: Anner Crete M.D.  On: 03/12/2022 03:19  CLINICAL DATA: Two months of intermittent right upper quadrant pain   EXAM:  NUCLEAR MEDICINE HEPATOBILIARY IMAGING WITH GALLBLADDER EF   TECHNIQUE:  Sequential images of the abdomen were obtained out to 60 minutes  following intravenous administration of radiopharmaceutical. After  oral ingestion of Ensure, gallbladder ejection fraction was  determined. At 60 min, normal ejection fraction is greater than 33%.   RADIOPHARMACEUTICALS:  4.511 mCi Tc-67m Choletec IV   COMPARISON: CT abdomen and pelvis February 20, 2021.   FINDINGS:  Prompt uptake and biliary excretion of activity by the liver is  seen. Gallbladder activity is visualized, consistent with patency of  cystic duct. Biliary activity passes into small bowel, consistent  with patent common bile duct.   Calculated gallbladder ejection fraction is 55%. (Normal gallbladder  ejection fraction with Ensure is greater than 33%.). Patient  reported 6/10 right upper quadrant pain and became nauseous upon  ingestion of Ensure.   IMPRESSION:  Normal gallbladder ejection fraction.   However, patient reports 6/10 right upper quadrant pain and nausea  upon ingestion of Ensure, which is nonspecific.   Electronically Signed  By: Dahlia Bailiff MD  On: 03/21/2021 09:51  Assessment:    Biliary colic [C37.62] Workup so far negative to date, but persistent symptoms for a year, with very typical presentation for gallbladder etiology, so will proceed with lap chole. Understanding no guarantees in symptoms relief.  Plan:    1. Biliary colic [G31.51] Discussed the risk of surgery including post-op infxn, seroma, biloma, chronic pain, poor-delayed wound healing, retained gallstone, conversion to open procedure, post-op SBO or ileus, and need for additional procedures to address said risks. The risks of general anesthetic including MI, CVA, sudden death or even reaction to anesthetic medications also discussed. Alternatives include continued observation. Benefits include possible symptom relief, prevention of complications including acute cholecystitis, pancreatitis.  Typical post operative recovery of 3-5 days rest, continued pain in area and incision sites, possible loose stools up to 4-6 weeks, also discussed.  ED return precautions given for sudden increase in RUQ pain, with possible accompanying fever, nausea, and/or vomiting.  The patient understands the risks, any and all  questions were answered to the patient's satisfaction.  labs/images/medications/previous chart entries reviewed personally and relevant changes/updates noted above.

## 2023-01-09 NOTE — H&P (Signed)
Subjective:   CC: Biliary colic [W09.81]  HPI: Deborah Fields is a 37 y.o. female who was referred by Benjamine Sprague, DO for evaluation of above CC. Symptoms were first noted 1 year ago. 3-4 episodes/mo Pain is sharp, right upper quadrant to right shoulder pain, without radiation. Lasts a few hours then resolves. Associated with nausea, pale loose stool, exacerbated by food consumption.  Past Medical History: has a past medical history of Allergic rhinitis due to allergen, Arrhythmia (05/15/2021), Generalized anxiety disorder, GERD (gastroesophageal reflux disease), Hyperlipidemia, Major depressive disorder, and Pneumonia.  Past Surgical History: has a past surgical history that includes Cesarean section; Tonsillectomy; Plantar fasciectomy (Left, 09/2020); egd (09/04/2021); and Tubal ligation (10/30/2017).  Family History: family history includes Alzheimer's disease in her maternal grandmother; Anxiety in her father and sister; Asthma in her mother; COPD in her mother; Coronary Artery Disease (Blocked arteries around heart) in her father and mother; Depression in her father, mother, and sister; Diabetes in her mother; Diabetes type II in her mother; Heart disease in her father; High blood pressure (Hypertension) in her father and mother; Hyperlipidemia (Elevated cholesterol) in her father and mother; Obesity in her mother; Osteoporosis (Thinning of bones) in her mother; Thyroid disease in her mother.  Social History: reports that she has quit smoking. She has been exposed to tobacco smoke. She has never used smokeless tobacco. She reports that she does not drink alcohol and does not use drugs.  Current Medications: has a current medication list which includes the following prescription(s): clonazepam, dexlansoprazole, duloxetine, fenofibrate, hydrocortisone, magnesium oxide, metoprolol tartrate, multivitamin, pravastatin, semaglutide, sertraline, sucralfate, and albuterol.  Allergies:  Allergies as of  01/05/2023 - Reviewed 01/05/2023  Allergen Reaction Noted  Sulfa (sulfonamide antibiotics) Hives and Rash 04/24/2015   ROS:  A 15 point review of systems was performed and pertinent positives and negatives noted in HPI   Objective:    BP 99/62  Pulse 75  Ht 170.2 cm (5\' 7" )  Wt 99.3 kg (219 lb)  LMP 12/10/2022 (Exact Date)  BMI 34.30 kg/m   Constitutional : No distress, cooperative, alert  Lymphatics/Throat: Supple with no lymphadenopathy  Respiratory: Clear to auscultation bilaterally  Cardiovascular: Regular rate and rhythm  Gastrointestinal: Soft, non-tender, non-distended, no organomegaly.  Musculoskeletal: Steady gait and movement  Skin: Cool and moist, no surgical scars  Psychiatric: Normal affect, non-agitated, not confused    LABS:  n/a   RADS: CLINICAL DATA: Right upper quadrant abdominal pain and nausea.   EXAM:  ULTRASOUND ABDOMEN LIMITED RIGHT UPPER QUADRANT   COMPARISON: CT abdomen pelvis dated 02/20/2021.   FINDINGS:  Gallbladder:   No gallstones or wall thickening visualized. No sonographic Murphy  sign noted by sonographer.   Common bile duct:   Diameter: 4 mm   Liver:   No focal lesion identified. Within normal limits in parenchymal  echogenicity. Portal vein is patent on color Doppler imaging with  normal direction of blood flow towards the liver.   Other: None.   IMPRESSION:  Unremarkable right upper quadrant ultrasound.   Electronically Signed  By: Anner Crete M.D.  On: 03/12/2022 03:19  CLINICAL DATA: Two months of intermittent right upper quadrant pain   EXAM:  NUCLEAR MEDICINE HEPATOBILIARY IMAGING WITH GALLBLADDER EF   TECHNIQUE:  Sequential images of the abdomen were obtained out to 60 minutes  following intravenous administration of radiopharmaceutical. After  oral ingestion of Ensure, gallbladder ejection fraction was  determined. At 60 min, normal ejection fraction is greater than 33%.   RADIOPHARMACEUTICALS:  4.511 mCi Tc-99m Choletec IV   COMPARISON: CT abdomen and pelvis February 20, 2021.   FINDINGS:  Prompt uptake and biliary excretion of activity by the liver is  seen. Gallbladder activity is visualized, consistent with patency of  cystic duct. Biliary activity passes into small bowel, consistent  with patent common bile duct.   Calculated gallbladder ejection fraction is 55%. (Normal gallbladder  ejection fraction with Ensure is greater than 33%.). Patient  reported 6/10 right upper quadrant pain and became nauseous upon  ingestion of Ensure.   IMPRESSION:  Normal gallbladder ejection fraction.   However, patient reports 6/10 right upper quadrant pain and nausea  upon ingestion of Ensure, which is nonspecific.   Electronically Signed  By: Jeffrey Waltz MD  On: 03/21/2021 09:51  Assessment:    Biliary colic [K80.50] Workup so far negative to date, but persistent symptoms for a year, with very typical presentation for gallbladder etiology, so will proceed with lap chole. Understanding no guarantees in symptoms relief.  Plan:    1. Biliary colic [K80.50] Discussed the risk of surgery including post-op infxn, seroma, biloma, chronic pain, poor-delayed wound healing, retained gallstone, conversion to open procedure, post-op SBO or ileus, and need for additional procedures to address said risks. The risks of general anesthetic including MI, CVA, sudden death or even reaction to anesthetic medications also discussed. Alternatives include continued observation. Benefits include possible symptom relief, prevention of complications including acute cholecystitis, pancreatitis.  Typical post operative recovery of 3-5 days rest, continued pain in area and incision sites, possible loose stools up to 4-6 weeks, also discussed.  ED return precautions given for sudden increase in RUQ pain, with possible accompanying fever, nausea, and/or vomiting.  The patient understands the risks, any and all  questions were answered to the patient's satisfaction.  labs/images/medications/previous chart entries reviewed personally and relevant changes/updates noted above.  

## 2023-01-09 NOTE — Patient Instructions (Addendum)
Your procedure is scheduled on: 01/15/23 - Thursday Report to the Registration Desk on the 1st floor of the [facility]. To find out your arrival time, please call [phone] between 1PM - 3PM on: 01/14/23 - Wednesday If your arrival time is 6:00 am, do not arrive prior to that time as the [facility] entrance doors do not open until 6:00 am.  REMEMBER: Instructions that are not followed completely may result in serious medical risk, up to and including death; or upon the discretion of your surgeon and anesthesiologist your surgery may need to be rescheduled.  Do not eat food after midnight the night before surgery.  No gum chewing, lozengers or hard candies.  You may however, drink CLEAR liquids up to 2 hours before you are scheduled to arrive for your surgery. Do not drink anything within 2 hours of your scheduled arrival time.  Clear liquids include: - water  - apple juice without pulp - gatorade (not RED colors) - black coffee or tea (Do NOT add milk or creamers to the coffee or tea) Do NOT drink anything that is not on this list.  TAKE THESE MEDICATIONS THE MORNING OF SURGERY WITH A SIP OF WATER:  - DEXILANT 60 MG capsule    One week prior to surgery: Stop Anti-inflammatories (NSAIDS) such as Advil, Aleve, Ibuprofen, Motrin, Naproxen, Naprosyn and Aspirin based products such as Excedrin, Goodys Powder, BC Powder.  Stop ANY OVER THE COUNTER supplements until after surgery.  You may take Tylenol if needed for pain up until the day of surgery.  No Alcohol for 24 hours before or after surgery.  No Smoking including e-cigarettes for 24 hours prior to surgery.  No chewable tobacco products for at least 6 hours prior to surgery.  No nicotine patches on the day of surgery.  Do not use any "recreational" drugs for at least a week prior to your surgery.  Please be advised that the combination of cocaine and anesthesia may have negative outcomes, up to and including death. If  you test positive for cocaine, your surgery will be cancelled.  On the morning of surgery brush your teeth with toothpaste and water, you may rinse your mouth with mouthwash if you wish. Do not swallow any toothpaste or mouthwash.  Use CHG Soap or wipes as directed on instruction sheet.  Do not wear jewelry, make-up, hairpins, clips or nail polish.  Do not wear lotions, powders, or perfumes.   Do not shave body from the neck down 48 hours prior to surgery just in case you cut yourself which could leave a site for infection. Also, freshly shaved skin may become irritated if using the CHG soap.  Contact lenses, hearing aids and dentures may not be worn into surgery.  Do not bring valuables to the hospital. [facility] is not responsible for any missing/lost belongings or valuables.   Notify your doctor if there is any change in your medical condition (cold, fever, infection).  Wear comfortable clothing (specific to your surgery type) to the hospital.  After surgery, you can help prevent lung complications by doing breathing exercises.  Take deep breaths and cough every 1-2 hours. Your doctor may order a device called an Incentive Spirometer to help you take deep breaths. When coughing or sneezing, hold a pillow firmly against your incision with both hands. This is called "splinting." Doing this helps protect your incision. It also decreases belly discomfort.  If you are being admitted to the hospital overnight, leave your suitcase  in the car. After surgery it may be brought to your room.  If you are being discharged the day of surgery, you will not be allowed to drive home. You will need a responsible adult (18 years or older) to drive you home and stay with you that night.   If you are taking public transportation, you will need to have a responsible adult (18 years or older) with you. Please confirm with your physician that it is acceptable to use public transportation.   Please call  the [facility] at [phone] if you have any questions about these instructions.  Surgery Visitation Policy:  Patients undergoing a surgery or procedure may have two family members or support persons with them as long as the person is not COVID-19 positive or experiencing its symptoms.   Inpatient Visitation:    Visiting hours are 7 a.m. to 8 p.m. Up to four visitors are allowed at one time in a patient room. The visitors may rotate out with other people during the day. One designated support person (adult) may remain overnight.  Due to an increase in RSV and influenza rates and associated hospitalizations, children ages 28 and under will not be able to visit patients in [facility]. Masks continue to be strongly recommended.

## 2023-01-14 MED ORDER — INDOCYANINE GREEN 25 MG IV SOLR
1.2500 mg | Freq: Once | INTRAVENOUS | Status: AC
  Administered 2023-01-15: 1.25 mg via INTRAVENOUS
  Filled 2023-01-14: qty 0.5

## 2023-01-15 ENCOUNTER — Ambulatory Visit: Payer: BC Managed Care – PPO | Admitting: Urgent Care

## 2023-01-15 ENCOUNTER — Other Ambulatory Visit: Payer: Self-pay

## 2023-01-15 ENCOUNTER — Ambulatory Visit
Admission: RE | Admit: 2023-01-15 | Discharge: 2023-01-15 | Disposition: A | Discharge status: Home / Self Care | Payer: BC Managed Care – PPO | Attending: Surgery | Admitting: Surgery

## 2023-01-15 ENCOUNTER — Encounter
Admission: RE | Disposition: A | Discharge status: Home / Self Care | Payer: Self-pay | Source: Home / Self Care | Attending: Surgery

## 2023-01-15 ENCOUNTER — Encounter: Payer: Self-pay | Admitting: Surgery

## 2023-01-15 DIAGNOSIS — K811 Chronic cholecystitis: Secondary | ICD-10-CM | POA: Diagnosis not present

## 2023-01-15 DIAGNOSIS — K449 Diaphragmatic hernia without obstruction or gangrene: Secondary | ICD-10-CM | POA: Insufficient documentation

## 2023-01-15 DIAGNOSIS — Z01812 Encounter for preprocedural laboratory examination: Secondary | ICD-10-CM

## 2023-01-15 DIAGNOSIS — F32A Depression, unspecified: Secondary | ICD-10-CM | POA: Insufficient documentation

## 2023-01-15 DIAGNOSIS — Z87891 Personal history of nicotine dependence: Secondary | ICD-10-CM | POA: Insufficient documentation

## 2023-01-15 DIAGNOSIS — K219 Gastro-esophageal reflux disease without esophagitis: Secondary | ICD-10-CM | POA: Insufficient documentation

## 2023-01-15 DIAGNOSIS — F419 Anxiety disorder, unspecified: Secondary | ICD-10-CM | POA: Diagnosis not present

## 2023-01-15 DIAGNOSIS — K8050 Calculus of bile duct without cholangitis or cholecystitis without obstruction: Secondary | ICD-10-CM | POA: Diagnosis present

## 2023-01-15 HISTORY — DX: Abnormal electrocardiogram (ECG) (EKG): R94.31

## 2023-01-15 HISTORY — DX: Generalized anxiety disorder: F41.1

## 2023-01-15 HISTORY — DX: Major depressive disorder, single episode, unspecified: F32.9

## 2023-01-15 HISTORY — DX: Anemia complicating pregnancy, unspecified trimester: O99.019

## 2023-01-15 HISTORY — PX: CHOLECYSTECTOMY: SHX55

## 2023-01-15 HISTORY — DX: Hyperlipidemia, unspecified: E78.5

## 2023-01-15 LAB — POCT PREGNANCY, URINE: Preg Test, Ur: NEGATIVE

## 2023-01-15 SURGERY — CHOLECYSTECTOMY, ROBOT-ASSISTED, LAPAROSCOPIC
Anesthesia: General | Site: Abdomen

## 2023-01-15 MED ORDER — ROCURONIUM BROMIDE 100 MG/10ML IV SOLN
INTRAVENOUS | Status: DC | PRN
  Administered 2023-01-15: 20 mg via INTRAVENOUS
  Administered 2023-01-15: 50 mg via INTRAVENOUS

## 2023-01-15 MED ORDER — BUPIVACAINE-EPINEPHRINE (PF) 0.5% -1:200000 IJ SOLN
INTRAMUSCULAR | Status: AC
  Filled 2023-01-15: qty 30

## 2023-01-15 MED ORDER — 0.9 % SODIUM CHLORIDE (POUR BTL) OPTIME
TOPICAL | Status: DC | PRN
  Administered 2023-01-15: 500 mL

## 2023-01-15 MED ORDER — IBUPROFEN 800 MG PO TABS: 800.0000 mg | ORAL_TABLET | Freq: Three times a day (TID) | ORAL | 0 refills | Status: DC | PRN

## 2023-01-15 MED ORDER — SUGAMMADEX SODIUM 200 MG/2ML IV SOLN
INTRAVENOUS | Status: DC | PRN
  Administered 2023-01-15: 200 mg via INTRAVENOUS

## 2023-01-15 MED ORDER — DEXAMETHASONE SODIUM PHOSPHATE 10 MG/ML IJ SOLN
INTRAMUSCULAR | Status: AC
  Filled 2023-01-15: qty 2

## 2023-01-15 MED ORDER — CHLORHEXIDINE GLUCONATE 0.12 % MT SOLN
OROMUCOSAL | Status: AC
  Administered 2023-01-15: 15 mL via OROMUCOSAL
  Filled 2023-01-15: qty 15

## 2023-01-15 MED ORDER — DEXAMETHASONE SODIUM PHOSPHATE 10 MG/ML IJ SOLN
INTRAMUSCULAR | Status: DC | PRN
  Administered 2023-01-15: 10 mg via INTRAVENOUS

## 2023-01-15 MED ORDER — PROMETHAZINE HCL 25 MG/ML IJ SOLN
INTRAMUSCULAR | Status: AC
  Filled 2023-01-15: qty 1

## 2023-01-15 MED ORDER — CEFAZOLIN SODIUM-DEXTROSE 2-4 GM/100ML-% IV SOLN
2.0000 g | INTRAVENOUS | Status: AC
  Administered 2023-01-15: 2 g via INTRAVENOUS

## 2023-01-15 MED ORDER — CEFAZOLIN SODIUM-DEXTROSE 2-4 GM/100ML-% IV SOLN
INTRAVENOUS | Status: AC
  Filled 2023-01-15: qty 100

## 2023-01-15 MED ORDER — SUCCINYLCHOLINE CHLORIDE 200 MG/10ML IV SOSY
PREFILLED_SYRINGE | INTRAVENOUS | Status: AC
  Filled 2023-01-15: qty 10

## 2023-01-15 MED ORDER — MIDAZOLAM HCL 2 MG/2ML IJ SOLN
INTRAMUSCULAR | Status: AC
  Filled 2023-01-15: qty 2

## 2023-01-15 MED ORDER — PROMETHAZINE HCL 25 MG/ML IJ SOLN
6.2500 mg | INTRAMUSCULAR | Status: AC | PRN
  Administered 2023-01-15: 6.25 mg via INTRAVENOUS

## 2023-01-15 MED ORDER — LIDOCAINE HCL (PF) 1 % IJ SOLN
INTRAMUSCULAR | Status: AC
  Filled 2023-01-15: qty 30

## 2023-01-15 MED ORDER — MIDAZOLAM HCL 2 MG/2ML IJ SOLN
INTRAMUSCULAR | Status: DC | PRN
  Administered 2023-01-15: 2 mg via INTRAVENOUS

## 2023-01-15 MED ORDER — HYDROCODONE-ACETAMINOPHEN 5-325 MG PO TABS: 1.0000 | ORAL_TABLET | Freq: Four times a day (QID) | ORAL | 0 refills | Status: DC | PRN

## 2023-01-15 MED ORDER — ORAL CARE MOUTH RINSE: 15.0000 mL | Freq: Once | OROMUCOSAL | Status: AC

## 2023-01-15 MED ORDER — LACTATED RINGERS IV SOLN: INTRAVENOUS | Status: DC

## 2023-01-15 MED ORDER — ROCURONIUM BROMIDE 10 MG/ML (PF) SYRINGE
PREFILLED_SYRINGE | INTRAVENOUS | Status: AC
  Filled 2023-01-15: qty 10

## 2023-01-15 MED ORDER — OXYCODONE HCL 5 MG PO TABS
ORAL_TABLET | ORAL | Status: AC
  Administered 2023-01-15: 5 mg via ORAL
  Filled 2023-01-15: qty 1

## 2023-01-15 MED ORDER — CHLORHEXIDINE GLUCONATE CLOTH 2 % EX PADS
6.0000 | MEDICATED_PAD | Freq: Once | CUTANEOUS | Status: AC
  Administered 2023-01-15: 6 via TOPICAL

## 2023-01-15 MED ORDER — FENTANYL CITRATE (PF) 100 MCG/2ML IJ SOLN: 25.0000 ug | INTRAMUSCULAR | Status: DC | PRN

## 2023-01-15 MED ORDER — DOCUSATE SODIUM 100 MG PO CAPS: 100.0000 mg | ORAL_CAPSULE | Freq: Two times a day (BID) | ORAL | 0 refills | Status: AC | PRN

## 2023-01-15 MED ORDER — PHENYLEPHRINE 80 MCG/ML (10ML) SYRINGE FOR IV PUSH (FOR BLOOD PRESSURE SUPPORT)
PREFILLED_SYRINGE | INTRAVENOUS | Status: AC
  Filled 2023-01-15: qty 10

## 2023-01-15 MED ORDER — OXYCODONE HCL 5 MG PO TABS: 5.0000 mg | ORAL_TABLET | Freq: Once | ORAL | Status: AC

## 2023-01-15 MED ORDER — KETOROLAC TROMETHAMINE 30 MG/ML IJ SOLN
INTRAMUSCULAR | Status: AC
  Filled 2023-01-15: qty 1

## 2023-01-15 MED ORDER — CHLORHEXIDINE GLUCONATE 0.12 % MT SOLN: 15.0000 mL | Freq: Once | OROMUCOSAL | Status: AC

## 2023-01-15 MED ORDER — ACETAMINOPHEN 325 MG PO TABS: 650.0000 mg | ORAL_TABLET | Freq: Three times a day (TID) | ORAL | 0 refills | Status: DC | PRN

## 2023-01-15 MED ORDER — ACETAMINOPHEN 10 MG/ML IV SOLN
INTRAVENOUS | Status: AC
  Filled 2023-01-15: qty 100

## 2023-01-15 MED ORDER — EPHEDRINE 5 MG/ML INJ
INTRAVENOUS | Status: AC
  Filled 2023-01-15: qty 5

## 2023-01-15 MED ORDER — LIDOCAINE HCL (PF) 2 % IJ SOLN
INTRAMUSCULAR | Status: AC
  Filled 2023-01-15: qty 10

## 2023-01-15 MED ORDER — ONDANSETRON HCL 4 MG/2ML IJ SOLN
INTRAMUSCULAR | Status: AC
  Filled 2023-01-15: qty 4

## 2023-01-15 MED ORDER — ONDANSETRON HCL 4 MG/2ML IJ SOLN
INTRAMUSCULAR | Status: DC | PRN
  Administered 2023-01-15: 4 mg via INTRAVENOUS

## 2023-01-15 MED ORDER — LIDOCAINE-EPINEPHRINE (PF) 1 %-1:200000 IJ SOLN
INTRAMUSCULAR | Status: DC | PRN
  Administered 2023-01-15: 10 mL via INTRAMUSCULAR
  Administered 2023-01-15: 18 mL via INTRAMUSCULAR

## 2023-01-15 MED ORDER — KETOROLAC TROMETHAMINE 30 MG/ML IJ SOLN
INTRAMUSCULAR | Status: DC | PRN
  Administered 2023-01-15: 30 mg via INTRAVENOUS

## 2023-01-15 MED ORDER — DEXMEDETOMIDINE HCL IN NACL 80 MCG/20ML IV SOLN
INTRAVENOUS | Status: DC | PRN
  Administered 2023-01-15 (×2): 4 ug via INTRAVENOUS

## 2023-01-15 MED ORDER — PROPOFOL 1000 MG/100ML IV EMUL
INTRAVENOUS | Status: AC
  Filled 2023-01-15: qty 100

## 2023-01-15 MED ORDER — LIDOCAINE HCL (CARDIAC) PF 100 MG/5ML IV SOSY
PREFILLED_SYRINGE | INTRAVENOUS | Status: DC | PRN
  Administered 2023-01-15: 100 mg via INTRAVENOUS

## 2023-01-15 MED ORDER — FENTANYL CITRATE (PF) 100 MCG/2ML IJ SOLN
INTRAMUSCULAR | Status: AC
  Filled 2023-01-15: qty 2

## 2023-01-15 MED ORDER — FENTANYL CITRATE (PF) 100 MCG/2ML IJ SOLN
INTRAMUSCULAR | Status: DC | PRN
  Administered 2023-01-15 (×4): 50 ug via INTRAVENOUS

## 2023-01-15 MED ORDER — ACETAMINOPHEN 10 MG/ML IV SOLN
INTRAVENOUS | Status: DC | PRN
  Administered 2023-01-15: 1000 mg via INTRAVENOUS

## 2023-01-15 MED ORDER — PROPOFOL 500 MG/50ML IV EMUL
INTRAVENOUS | Status: DC | PRN
  Administered 2023-01-15: 20 ug/kg/min via INTRAVENOUS

## 2023-01-15 MED ORDER — PROMETHAZINE HCL 12.5 MG PO TABS: 12.5000 mg | ORAL_TABLET | Freq: Three times a day (TID) | ORAL | 0 refills | Status: DC | PRN

## 2023-01-15 MED ORDER — PROPOFOL 10 MG/ML IV BOLUS
INTRAVENOUS | Status: DC | PRN
  Administered 2023-01-15: 150 mg via INTRAVENOUS

## 2023-01-15 MED ORDER — PROPOFOL 10 MG/ML IV BOLUS
INTRAVENOUS | Status: AC
  Filled 2023-01-15: qty 20

## 2023-01-15 MED ORDER — PROMETHAZINE HCL 25 MG/ML IJ SOLN
INTRAMUSCULAR | Status: AC
  Administered 2023-01-15: 6.25 mg via INTRAVENOUS
  Filled 2023-01-15: qty 1

## 2023-01-15 SURGICAL SUPPLY — 53 items
ANCHOR TIS RET SYS 235ML (MISCELLANEOUS) ×1 IMPLANT
BAG PRESSURE INF REUSE 1000 (BAG) IMPLANT
BLADE SURG SZ11 CARB STEEL (BLADE) ×1 IMPLANT
CANNULA REDUC XI 12-8 STAPL (CANNULA) ×1
CANNULA REDUCER 12-8 DVNC XI (CANNULA) ×1 IMPLANT
CATH REDDICK CHOLANGI 4FR 50CM (CATHETERS) IMPLANT
CLIP LIGATING HEMO O LOK GREEN (MISCELLANEOUS) ×1 IMPLANT
DERMABOND ADVANCED .7 DNX12 (GAUZE/BANDAGES/DRESSINGS) ×1 IMPLANT
DRAPE ARM DVNC X/XI (DISPOSABLE) ×4 IMPLANT
DRAPE C-ARM XRAY 36X54 (DRAPES) IMPLANT
DRAPE COLUMN DVNC XI (DISPOSABLE) ×1 IMPLANT
DRAPE DA VINCI XI ARM (DISPOSABLE) ×4
DRAPE DA VINCI XI COLUMN (DISPOSABLE) ×1
ELECT CAUTERY BLADE 6.4 (BLADE) ×1 IMPLANT
ELECT REM PT RETURN 9FT ADLT (ELECTROSURGICAL) ×1
ELECTRODE REM PT RTRN 9FT ADLT (ELECTROSURGICAL) ×1 IMPLANT
GLOVE BIOGEL PI IND STRL 7.0 (GLOVE) ×2 IMPLANT
GLOVE SURG SYN 6.5 ES PF (GLOVE) ×2 IMPLANT
GLOVE SURG SYN 6.5 PF PI (GLOVE) ×2 IMPLANT
GOWN STRL REUS W/ TWL LRG LVL3 (GOWN DISPOSABLE) ×3 IMPLANT
GOWN STRL REUS W/TWL LRG LVL3 (GOWN DISPOSABLE) ×3
GRASPER SUT TROCAR 14GX15 (MISCELLANEOUS) IMPLANT
IRRIGATOR SUCT 8 DISP DVNC XI (IRRIGATION / IRRIGATOR) IMPLANT
IRRIGATOR SUCTION 8MM XI DISP (IRRIGATION / IRRIGATOR)
IV NS 1000ML (IV SOLUTION)
IV NS 1000ML BAXH (IV SOLUTION) IMPLANT
KIT TURNOVER KIT A (KITS) ×1 IMPLANT
LABEL OR SOLS (LABEL) ×1 IMPLANT
MANIFOLD NEPTUNE II (INSTRUMENTS) ×1 IMPLANT
NDL INSUFFLATION 14GA 120MM (NEEDLE) ×1 IMPLANT
NEEDLE HYPO 22GX1.5 SAFETY (NEEDLE) ×1 IMPLANT
NEEDLE INSUFFLATION 14GA 120MM (NEEDLE) ×1 IMPLANT
NS IRRIG 500ML POUR BTL (IV SOLUTION) ×1 IMPLANT
OBTURATOR OPTICAL STANDARD 8MM (TROCAR) ×1
OBTURATOR OPTICAL STND 8 DVNC (TROCAR) ×1
OBTURATOR OPTICALSTD 8 DVNC (TROCAR) ×1 IMPLANT
PACK LAP CHOLECYSTECTOMY (MISCELLANEOUS) ×1 IMPLANT
PENCIL SMOKE EVACUATOR (MISCELLANEOUS) ×1 IMPLANT
SEAL CANN UNIV 5-8 DVNC XI (MISCELLANEOUS) ×3 IMPLANT
SEAL XI 5MM-8MM UNIVERSAL (MISCELLANEOUS) ×3
SET TUBE SMOKE EVAC HIGH FLOW (TUBING) ×1 IMPLANT
SOLUTION ELECTROLUBE (MISCELLANEOUS) ×1 IMPLANT
SPIKE FLUID TRANSFER (MISCELLANEOUS) ×2 IMPLANT
STAPLER CANNULA SEAL DVNC XI (STAPLE) ×1 IMPLANT
STAPLER CANNULA SEAL XI (STAPLE) ×1
SUT MNCRL 4-0 (SUTURE) ×2
SUT MNCRL 4-0 27XMFL (SUTURE) ×2
SUT VICRYL 0 AB UR-6 (SUTURE) IMPLANT
SUT VICRYL 0 UR6 27IN ABS (SUTURE) ×1 IMPLANT
SUTURE MNCRL 4-0 27XMF (SUTURE) ×2 IMPLANT
SYR 30ML LL (SYRINGE) IMPLANT
SYSTEM WECK SHIELD CLOSURE (TROCAR) IMPLANT
TRAP FLUID SMOKE EVACUATOR (MISCELLANEOUS) ×1 IMPLANT

## 2023-01-15 NOTE — Interval H&P Note (Signed)
History and Physical Interval Note:  01/15/2023 10:05 AM  Deborah Fields  has presented today for surgery, with the diagnosis of Y86.57 Biliary collic.  The various methods of treatment have been discussed with the patient and family. After consideration of risks, benefits and other options for treatment, the patient has consented to  Procedure(s): XI ROBOTIC [place] (N/A) as a surgical intervention.  The patient's history has been reviewed, patient examined, no change in status, stable for surgery.  I have reviewed the patient's chart and labs.  Questions were answered to the patient's satisfaction.    Patient still verbalized understanding that no guarantee any pain resolution since imaging and workup so far has been negative   Deborah Fields

## 2023-01-15 NOTE — Anesthesia Procedure Notes (Signed)
Procedure Name: Intubation Date/Time: 01/15/2023 10:28 AM  Performed by: Lily Peer, Canary Fister, CRNAPre-anesthesia Checklist: Patient identified, Emergency Drugs available, Suction available and Patient being monitored Patient Re-evaluated:Patient Re-evaluated prior to induction Oxygen Delivery Method: Circle system utilized Preoxygenation: Pre-oxygenation with 100% oxygen Induction Type: IV induction Ventilation: Mask ventilation without difficulty Laryngoscope Size: McGraph and 3 Grade View: Grade I Tube type: Oral Tube size: 7.0 mm Number of attempts: 1 Airway Equipment and Method: Stylet Placement Confirmation: ETT inserted through vocal cords under direct vision, positive ETCO2 and breath sounds checked- equal and bilateral Secured at: 20 cm Tube secured with: Tape Dental Injury: Teeth and Oropharynx as per pre-operative assessment

## 2023-01-15 NOTE — Op Note (Signed)
Preoperative diagnosis:  biliary colic  Postoperative diagnosis: same as above  Procedure: Robotic assisted Laparoscopic Cholecystectomy.   Anesthesia: GETA   Surgeon: Benjamine Sprague  Specimen: Gallbladder  Complications: None  EBL: 62mL  Wound Classification: Clean Contaminated  Indications: see HPI  Findings: Critical view of safety noted Cystic duct and artery identified, ligated and divided, clips remained intact at end of procedure Adequate hemostasis  Description of procedure:  The patient was placed on the operating table in the supine position. SCDs placed, pre-op abx administered.  General anesthesia was induced and OG tube placed by anesthesia. A time-out was completed verifying correct patient, procedure, site, positioning, and implant(s) and/or special equipment prior to beginning this procedure. The abdomen was prepped and draped in the usual sterile fashion.    Veress needle was placed at the Palmer's point and insufflation was started after confirming a positive saline drop test and no immediate increase in abdominal pressure.  After reaching 15 mm, the Veress needle was removed and a 8 mm port was placed via optiview technique under umbilicus measured 05LZ from gallbladder.  The abdomen was inspected and no abnormalities or injuries were found.  Under direct vision, ports were placed in the following locations: One 12 mm patient left of the umbilicus, 8cm from the optiviewed port, one 8 mm port placed to the patient right of the umbilical port 8 cm apart.  1 additional 8 mm port placed lateral to the 37mm port.  Once ports were placed, The table was placed in the reverse Trendelenburg position with the right side up. The Xi platform was brought into the operative field and docked to the ports successfully.  An endoscope was placed through the umbilical port, fenestrated grasper through the adjacent patient right port, prograsp to the far patient left port, and then a hook  cautery in the left port.  The dome of the gallbladder was grasped with prograsp, passed and retracted over the dome of the liver. Adhesions between the gallbladder and omentum, duodenum and transverse colon were lysed via hook cautery. The infundibulum was grasped with the fenestrated grasper and retracted toward the right lower quadrant. This maneuver exposed Calot's triangle. The peritoneum overlying the gallbladder infundibulum was then dissected  and the cystic duct and cystic artery identified.  Critical view of safety with the liver bed clearly visible behind the duct and artery with no additional structures noted.  The cystic duct and cystic artery clipped and divided close to the gallbladder.     The gallbladder was then dissected from its peritoneal and liver bed attachments by electrocautery. Hemostasis was checked prior to removing the hook cautery and the Endo Catch bag was then placed through the 12 mm port and the gallbladder was removed.  The gallbladder was passed off the table as a specimen. There was no evidence of bleeding from the gallbladder fossa or cystic artery or leakage of the bile from the cystic duct stump. The 12 mm port site closed with Efx Shield using 0 vicryl under direct vision.  Abdomen desufflated and secondary trocars were removed under direct vision. No bleeding was noted. All skin incisions then closed with subcuticular sutures of 4-0 monocryl and dressed with topical skin adhesive. The orogastric tube was removed and patient extubated.  The patient tolerated the procedure well and was taken to the postanesthesia care unit in stable condition.  All sponge and instrument count correct at end of procedure.

## 2023-01-15 NOTE — Transfer of Care (Signed)
Immediate Anesthesia Transfer of Care Note  Patient: Deborah Fields  Procedure(s) Performed: XI ROBOTIC ASSISTED LAPAROSCOPIC CHOLECYSTECTOMY (Abdomen)  Patient Location: PACU  Anesthesia Type:General  Level of Consciousness: awake and alert   Airway & Oxygen Therapy: Patient Spontanous Breathing and Patient connected to face mask oxygen  Post-op Assessment: Report given to RN and Post -op Vital signs reviewed and stable  Post vital signs: Reviewed and stable  Last Vitals:  Vitals Value Taken Time  BP 124/75 01/15/23 1200  Temp    Pulse 116 01/15/23 1202  Resp 16 01/15/23 1202  SpO2 96 % 01/15/23 1202  Vitals shown include unvalidated device data.  Last Pain:  Vitals:   01/15/23 0617  TempSrc: Oral  PainSc: 2          Complications: No notable events documented.

## 2023-01-15 NOTE — Interval H&P Note (Signed)
No change. Still ok to proceed

## 2023-01-15 NOTE — Anesthesia Preprocedure Evaluation (Signed)
Anesthesia Evaluation  Patient identified by MRN, date of birth, ID band Patient awake    Reviewed: Allergy & Precautions, H&P , NPO status , Patient's Chart, lab work & pertinent test results, reviewed documented beta blocker date and time   History of Anesthesia Complications (+) PONV and history of anesthetic complications  Airway Mallampati: I  TM Distance: >3 FB Neck ROM: full    Dental  (+) Dental Advidsory Given, Teeth Intact   Pulmonary neg shortness of breath, asthma , neg sleep apnea, neg COPD, Recent URI , former smoker   Pulmonary exam normal breath sounds clear to auscultation       Cardiovascular Exercise Tolerance: Good negative cardio ROS Normal cardiovascular exam Rhythm:regular Rate:Normal     Neuro/Psych  PSYCHIATRIC DISORDERS Anxiety Depression    negative neurological ROS     GI/Hepatic Neg liver ROS, hiatal hernia,GERD  ,,  Endo/Other  negative endocrine ROS    Renal/GU Renal disease (kidney stone)  negative genitourinary   Musculoskeletal   Abdominal   Peds  Hematology negative hematology ROS (+)   Anesthesia Other Findings Past Medical History: No date: Allergic rhinitis No date: Anemia affecting pregnancy No date: Asthma No date: Erosive esophagitis No date: GAD (generalized anxiety disorder)     Comment:  a.) followed by psychiatry; b.) on BZO (clonazepam) PRN               + sertraline No date: GERD (gastroesophageal reflux disease) No date: Headache No date: Hiatal hernia No date: History of kidney stones No date: HLD (hyperlipidemia) 01/2014: IBS (irritable bowel syndrome) No date: MDD (major depressive disorder)     Comment:  a.) followed by psychiatry; b.) on SSRI (sertraline) No date: MRSA (methicillin resistant Staphylococcus aureus) No date: Panic disorder without agoraphobia     Comment:  a.) followed by psychiatry 2016: Pneumonia No date: PONV (postoperative nausea  and vomiting) No date: Prolonged QT interval No date: Tachycardia   Reproductive/Obstetrics negative OB ROS                             Anesthesia Physical Anesthesia Plan  ASA: 2  Anesthesia Plan: General   Post-op Pain Management:    Induction: Intravenous  PONV Risk Score and Plan: 4 or greater and Propofol infusion, TIVA, Midazolam, Treatment may vary due to age or medical condition, Ondansetron and Dexamethasone  Airway Management Planned: Oral ETT  Additional Equipment:   Intra-op Plan:   Post-operative Plan: Extubation in OR  Informed Consent: I have reviewed the patients History and Physical, chart, labs and discussed the procedure including the risks, benefits and alternatives for the proposed anesthesia with the patient or authorized representative who has indicated his/her understanding and acceptance.     Dental Advisory Given  Plan Discussed with: Anesthesiologist, CRNA and Surgeon  Anesthesia Plan Comments:         Anesthesia Quick Evaluation

## 2023-01-15 NOTE — Discharge Instructions (Addendum)
AMBULATORY SURGERY  DISCHARGE INSTRUCTIONS  Laparoscopic Cholecystectomy, Care After This sheet gives you information about how to care for yourself after your procedure. Your doctor may also give you more specific instructions. If you have problems or questions, contact your doctor. Follow these instructions at home: Care for cuts from surgery (incisions)  Follow instructions from your doctor about how to take care of your cuts from surgery. Make sure you: Wash your hands with soap and water before you change your bandage (dressing). If you cannot use soap and water, use hand sanitizer. Change your bandage as told by your doctor. Leave stitches (sutures), skin glue, or skin tape (adhesive) strips in place. They may need to stay in place for 2 weeks or longer. If tape strips get loose and curl up, you may trim the loose edges. Do not remove tape strips completely unless your doctor says it is okay. Do not take baths, swim, or use a hot tub until your doctor says it is okay. OK TO SHOWER 24HRS AFTER YOUR SURGERY.  Check your surgical cut area every day for signs of infection. Check for: More redness, swelling, or pain. More fluid or blood. Warmth. Pus or a bad smell. Activity Do not drive or use heavy machinery while taking prescription pain medicine. Do not play contact sports until your doctor says it is okay. Do not drive for 24 hours if you were given a medicine to help you relax (sedative). Rest as needed. Do not return to work or school until your doctor says it is okay. General instructions  tylenol and advil as needed for discomfort.  Please alternate between the two every four hours as needed for pain.    Use narcotics, if prescribed, only when tylenol and motrin is not enough to control pain.  325-650mg  every 8hrs to max of 3000mg /24hrs (including the 325mg  in every norco dose) for the tylenol.    Advil up to 800mg  per dose every 8hrs as needed for pain.   To prevent or treat  constipation while you are taking prescription pain medicine, your doctor may recommend that you: Drink enough fluid to keep your pee (urine) clear or pale yellow. Take over-the-counter or prescription medicines. Eat foods that are high in fiber, such as fresh fruits and vegetables, whole grains, and beans. Limit foods that are high in fat and processed sugars, such as fried and sweet foods. Contact a doctor if: You develop a rash. You have more redness, swelling, or pain around your surgical cuts. You have more fluid or blood coming from your surgical cuts. Your surgical cuts feel warm to the touch. You have pus or a bad smell coming from your surgical cuts. You have a fever. One or more of your surgical cuts breaks open. You have trouble breathing. You have chest pain. You have pain that is getting worse in your shoulders. You faint or feel dizzy when you stand. You have very bad pain in your belly (abdomen). You are sick to your stomach (nauseous) for more than one day. You have throwing up (vomiting) that lasts for more than one day. You have leg pain. This information is not intended to replace advice given to you by your health care provider. Make sure you discuss any questions you have with your health care provider. Document Released: 09/09/2008 Document Revised: 06/21/2016 Document Reviewed: 05/19/2016 Elsevier Interactive Patient Education  [employer].  The drugs that you were given will stay in your system until tomorrow so for the  next 24 hours you should not:  Drive an automobile Make any legal decisions Drink any alcoholic beverage   You may resume regular meals tomorrow.  Today it is better to start with liquids and gradually work up to solid foods.  You may eat anything you prefer, but it is better to start with liquids, then soup and crackers, and gradually work up to solid foods.   Please notify your doctor immediately if you have any unusual bleeding,  trouble breathing, redness and pain at the surgery site, drainage, fever, or pain not relieved by medication.    Additional Instructions:        Please contact your physician with any problems or Same Day Surgery at [phone], Monday through Friday 6 am to 4 pm, or [place] at [facility] number at [phone].

## 2023-01-16 LAB — SURGICAL PATHOLOGY, GROSS ONLY (NOT ARMC)

## 2023-01-16 NOTE — Anesthesia Postprocedure Evaluation (Signed)
Anesthesia Post Note  Patient: Chenel Wernli Spike  Procedure(s) Performed: XI ROBOTIC ASSISTED LAPAROSCOPIC CHOLECYSTECTOMY (Abdomen)  Patient location during evaluation: PACU Anesthesia Type: General Level of consciousness: awake and alert Pain management: pain level controlled Vital Signs Assessment: post-procedure vital signs reviewed and stable Respiratory status: spontaneous breathing, nonlabored ventilation, respiratory function stable and patient connected to nasal cannula oxygen Cardiovascular status: blood pressure returned to baseline and stable Postop Assessment: no apparent nausea or vomiting Anesthetic complications: no   No notable events documented.   Last Vitals:  Vitals:   01/15/23 1342 01/15/23 1406  BP: 104/65 118/75  Pulse: 77 91  Resp: 16 18  Temp:    SpO2: 95% 96%    Last Pain:  Vitals:   01/15/23 1406  TempSrc:   PainSc: 6                  Martha Clan

## 2023-02-10 ENCOUNTER — Encounter: Payer: Self-pay | Admitting: Obstetrics and Gynecology

## 2023-02-10 ENCOUNTER — Ambulatory Visit: Payer: BC Managed Care – PPO | Admitting: Obstetrics and Gynecology

## 2023-02-10 VITALS — BP 110/73 | HR 80 | Resp 15 | Wt 209.7 lb

## 2023-02-10 DIAGNOSIS — L659 Nonscarring hair loss, unspecified: Secondary | ICD-10-CM

## 2023-02-10 DIAGNOSIS — E349 Endocrine disorder, unspecified: Secondary | ICD-10-CM

## 2023-02-10 DIAGNOSIS — G8929 Other chronic pain: Secondary | ICD-10-CM | POA: Diagnosis not present

## 2023-02-10 DIAGNOSIS — R232 Flushing: Secondary | ICD-10-CM

## 2023-02-10 NOTE — Progress Notes (Signed)
    GYNECOLOGY PROGRESS NOTE  Subjective:    Patient ID: Deborah Fields, female    DOB: March 09, 1986, 37 y.o.   MRN: [id number]  HPI  Patient is a 37 y.o. G52P2002 female who presents for evaluation of hormone levels. Chief Complaint  Patient presents with   Consult    Patient presents in office today to address concerns of hormonal changes. Patient reports for the past 6 months she has experienced the following symptoms: difficulty losing weight, chronic fatigue, hair loss/thinning, decreased libido, painful intercourse, vaginal dryness and heavier shorter menstrual cycles.     Has had thyroid checked recently had thyroid checked and was fine. No changes in medications. Did have an episode of pancreatitis in January.  Had gallbladder removed last year, but it made her RUQ  pain worse. Overall feels that there is something going on with her body that just has not been identified yet.     The following portions of the patient's history were reviewed and updated as appropriate: allergies, current medications, past family history, past medical history, past social history, past surgical history, and problem list.  Review of Systems Pertinent items noted in HPI and remainder of comprehensive ROS otherwise negative.   Objective:   Blood pressure 110/73, pulse 80, resp. rate 15, weight 209 lb 11.2 oz (95.1 kg), last menstrual period 01/31/2023, not currently breastfeeding. Body mass index is 32.84 kg/m. General appearance: alert and no distress Remainder of exam deferred.    Assessment:   1. Hair loss   2. Hot flashes   3. Chronic pain syndrome      Plan:   Patient desires hormonal levels checked. Will order. Will notify of lab results through [facility].  Discussed rare possibility of medications causing symptoms, especially anti-depressant medications (although patient has been on meds fo at least 4 years, no recent changes in  dosing).  Will also rule out auto-immune possibilities  (CRP and ANA ordered).     Rubie Maid, MD [place]

## 2023-02-11 ENCOUNTER — Other Ambulatory Visit: Payer: BC Managed Care – PPO

## 2023-02-14 ENCOUNTER — Other Ambulatory Visit: Payer: Self-pay | Admitting: Obstetrics and Gynecology

## 2023-02-14 LAB — IRON,TIBC AND FERRITIN PANEL
Ferritin: 38 ng/mL (ref 15–150)
Iron Saturation: 25 % (ref 15–55)
Iron: 99 ug/dL (ref 27–159)
Total Iron Binding Capacity: 401 ug/dL (ref 250–450)
UIBC: 302 ug/dL (ref 131–425)

## 2023-02-14 LAB — ANA: Anti Nuclear Antibody (ANA): NEGATIVE

## 2023-02-14 LAB — ESTRADIOL: Estradiol: 137 pg/mL

## 2023-02-14 LAB — FOLLICLE STIMULATING HORMONE: FSH: 12.4 m[IU]/mL

## 2023-02-14 LAB — PROGESTERONE: Progesterone: 0.6 ng/mL

## 2023-02-14 LAB — VITAMIN D 25 HYDROXY (VIT D DEFICIENCY, FRACTURES): Vit D, 25-Hydroxy: 23.6 ng/mL — ABNORMAL LOW (ref 30.0–100.0)

## 2023-02-14 LAB — MAGNESIUM: Magnesium: 2.1 mg/dL (ref 1.6–2.3)

## 2023-02-14 LAB — LUTEINIZING HORMONE: LH: 49.3 m[IU]/mL

## 2023-02-14 LAB — C-REACTIVE PROTEIN: CRP: <1 mg/L (ref 0–10)

## 2023-02-14 LAB — FOLATE: Folate: 9.4 ng/mL (ref >3.0)

## 2023-02-14 LAB — VITAMIN B12: Vitamin B-12: 385 pg/mL (ref 232–1245)

## 2023-02-14 LAB — CALCIUM: Calcium: 9.7 mg/dL (ref 8.7–10.2)

## 2023-02-14 LAB — ZINC: Zinc: 90 ug/dL (ref 44–115)

## 2023-02-14 MED ORDER — VITAMIN D (ERGOCALCIFEROL) 1.25 MG (50000 UNIT) PO CAPS: 50000.0000 [IU] | ORAL_CAPSULE | ORAL | 0 refills | Status: AC

## 2023-05-13 ENCOUNTER — Telehealth: Payer: Self-pay

## 2023-05-13 NOTE — Progress Notes (Unsigned)
GYNECOLOGY ANNUAL PHYSICAL EXAM PROGRESS NOTE  Subjective:    Deborah Fields is a 37 y.o. G26P2002 female who presents for an annual exam. The patient has no complaints today. The patient {is/is not/has never been:13135} sexually active. The patient participates in regular exercise: {yes/no/not asked:9010}. Has the patient ever been transfused or tattooed?: {yes/no/not asked:9010}. The patient reports that there {is/is not:9024} domestic violence in her life.    Menstrual History: Menarche age: 39 No LMP recorded.     Gynecologic History:  Contraception: tubal ligation History of STI's: Denies Last Pap: 05/07/2022. Results were: normal. Denies h/o abnormal pap smears. Last mammogram: Not age appropriate      OB History  Gravida Para Term Preterm AB Living  2 2 2  0 0 2  SAB IAB Ectopic Multiple Live Births  0 0 0 0 2    # Outcome Date GA Lbr Len/2nd Weight Sex Delivery Anes PTL Lv  2 Term 10/30/17 [redacted]w[redacted]d  8 lb 9.6 oz (3.9 kg) F CS-LTranv Spinal  LIV     Name: Beaston,GIRL Missi     Apgar1: 7  Apgar5: 9  1 Term 2012 [redacted]w[redacted]d  8 lb 7 oz (3.827 kg) F CS-Unspec Spinal  LIV    Obstetric Comments  G1- Fetal tachycardia, arrest of descent in 2nd stage (pushed for 3 hrs).     Past Medical History:  Diagnosis Date   Allergic rhinitis    Anemia affecting pregnancy    Asthma    Erosive esophagitis    GAD (generalized anxiety disorder)    a.) followed by psychiatry; b.) on BZO (clonazepam) PRN + sertraline   GERD (gastroesophageal reflux disease)    Headache    Hiatal hernia    History of kidney stones    HLD (hyperlipidemia)    IBS (irritable bowel syndrome) 01/2014   MDD (major depressive disorder)    a.) followed by psychiatry; b.) on SSRI (sertraline)   MRSA (methicillin resistant Staphylococcus aureus)    Panic disorder without agoraphobia    a.) followed by psychiatry   Pneumonia 2016   PONV (postoperative nausea and vomiting)    Prolonged QT interval     Tachycardia     Past Surgical History:  Procedure Laterality Date   CESAREAN SECTION     CESAREAN SECTION WITH BILATERAL TUBAL LIGATION Bilateral 10/30/2017   Procedure: CESAREAN SECTION WITH BILATERAL TUBAL LIGATION;  Surgeon: Hildred Laser, MD;  Location: [facility] ORS;  Service: Obstetrics;  Laterality: Bilateral;   COLONOSCOPY  01/2014   showed IBS, Hiatal Hernia, Erosive reflux esophagitis   ESOPHAGOGASTRODUODENOSCOPY     OVARIAN CYST REMOVAL Right    TONSILLECTOMY     TONSILLECTOMY     TUBAL LIGATION      Family History  Problem Relation Age of Onset   Diabetes Mother    Hypertension Mother    Hyperlipidemia Mother    COPD Mother    Heart disease Father    Hypertension Father    Hyperlipidemia Father    COPD Maternal Grandmother    COPD Maternal Grandfather    Cancer Paternal Grandfather        prostate   Stroke Neg Hx    Kidney disease Neg Hx    Bladder Cancer Neg Hx     Social History   Socioeconomic History   Marital status: Married    Spouse name: Molly Maduro   Number of children: 2   Years of education: Not on file   Highest education level:  Not on file  Occupational History   Not on file  Tobacco Use   Smoking status: Former    Packs/day: 0.50    Years: 5.00    Additional pack years: 0.00    Total pack years: 2.50    Types: Cigarettes    Quit date: 2008    Years since quitting: 16.4   Smokeless tobacco: Never   Tobacco comments:    quit 10 years  Vaping Use   Vaping Use: Never used  Substance and Sexual Activity   Alcohol use: No    Alcohol/week: 0.0 standard drinks of alcohol   Drug use: No   Sexual activity: Yes    Birth control/protection: Surgical  Other Topics Concern   Not on file  Social History Narrative   Not on file   Social Determinants of Health   Financial Resource Strain: Not on file  Food Insecurity: No Food Insecurity (01/04/2023)   Hunger Vital Sign    Worried About Running Out of Food in the Last Year: Never true    Ran  Out of Food in the Last Year: Never true  Transportation Needs: Not on file  Physical Activity: Not on file  Stress: Not on file  Social Connections: Not on file  Intimate Partner Violence: Not At Risk (01/04/2023)   Humiliation, Afraid, Rape, and Kick questionnaire    Fear of Current or Ex-Partner: No    Emotionally Abused: No    Physically Abused: No    Sexually Abused: No    Current Outpatient Medications on File Prior to Visit  Medication Sig Dispense Refill   Vitamin D, Ergocalciferol, (DRISDOL) 1.25 MG (50000 UNIT) CAPS capsule Take 1 capsule (50,000 Units total) by mouth every 7 (seven) days. 8 capsule 0   clonazePAM (KLONOPIN) 0.5 MG tablet Take 0.5 mg by mouth daily as needed for anxiety.  1   DEXILANT 60 MG capsule TAKE 1 CAPSULE BY MOUTH EVERY DAY (Patient taking differently: Take 60 mg by mouth every evening.) 90 capsule 1   DULoxetine (CYMBALTA) 60 MG capsule Take 60 mg by mouth every evening.     fenofibrate 160 MG tablet Take 160 mg by mouth every evening.     metoprolol tartrate (LOPRESSOR) 25 MG tablet Take 12.5-25 mg by mouth daily as needed (heartrate control).     pravastatin (PRAVACHOL) 40 MG tablet Take 40 mg by mouth at bedtime.     sertraline (ZOLOFT) 100 MG tablet Take 200 mg by mouth at bedtime.  3   sucralfate (CARAFATE) 1 g tablet Take 1 g by mouth 4 (four) times daily as needed (GI symptoms).     No current facility-administered medications on file prior to visit.    Allergies  Allergen Reactions   Sulfa Antibiotics Rash     Review of Systems Constitutional: negative for chills, fatigue, fevers and sweats Eyes: negative for irritation, redness and visual disturbance Ears, nose, mouth, throat, and face: negative for hearing loss, nasal congestion, snoring and tinnitus Respiratory: negative for asthma, cough, sputum Cardiovascular: negative for chest pain, dyspnea, exertional chest pressure/discomfort, irregular heart beat, palpitations and  syncope Gastrointestinal: negative for abdominal pain, change in bowel habits, nausea and vomiting Genitourinary: negative for abnormal menstrual periods, genital lesions, sexual problems and vaginal discharge, dysuria and urinary incontinence Integument/breast: negative for breast lump, breast tenderness and nipple discharge Hematologic/lymphatic: negative for bleeding and easy bruising Musculoskeletal:negative for back pain and muscle weakness Neurological: negative for dizziness, headaches, vertigo and weakness Endocrine: negative for diabetic  symptoms including polydipsia, polyuria and skin dryness Allergic/Immunologic: negative for hay fever and urticaria      Objective:  There were no vitals taken for this visit. There is no height or weight on file to calculate BMI.    General Appearance:    Alert, cooperative, no distress, appears stated age  Head:    Normocephalic, without obvious abnormality, atraumatic  Eyes:    PERRL, conjunctiva/corneas clear, EOM's intact, both eyes  Ears:    Normal external ear canals, both ears  Nose:   Nares normal, septum midline, mucosa normal, no drainage or sinus tenderness  Throat:   Lips, mucosa, and tongue normal; teeth and gums normal  Neck:   Supple, symmetrical, trachea midline, no adenopathy; thyroid: no enlargement/tenderness/nodules; no carotid bruit or JVD  Back:     Symmetric, no curvature, ROM normal, no CVA tenderness  Lungs:     Clear to auscultation bilaterally, respirations unlabored  Chest Wall:    No tenderness or deformity   Heart:    Regular rate and rhythm, S1 and S2 normal, no murmur, rub or gallop  Breast Exam:    No tenderness, masses, or nipple abnormality  Abdomen:     Soft, non-tender, bowel sounds active all four quadrants, no masses, no organomegaly.    Genitalia:    Pelvic:external genitalia normal, vagina without lesions, discharge, or tenderness, rectovaginal septum  normal. Cervix normal in appearance, no cervical  motion tenderness, no adnexal masses or tenderness.  Uterus normal size, shape, mobile, regular contours, nontender.  Rectal:    Normal external sphincter.  No hemorrhoids appreciated. Internal exam not done.   Extremities:   Extremities normal, atraumatic, no cyanosis or edema  Pulses:   2+ and symmetric all extremities  Skin:   Skin color, texture, turgor normal, no rashes or lesions  Lymph nodes:   Cervical, supraclavicular, and axillary nodes normal  Neurologic:   CNII-XII intact, normal strength, sensation and reflexes throughout   .  Labs:  Lab Results  Component Value Date   WBC 4.3 01/04/2023   HGB 12.0 01/04/2023   HCT 35.1 (L) 01/04/2023   MCV 85.6 01/04/2023   PLT 250 01/04/2023    Lab Results  Component Value Date   CREATININE 0.73 01/04/2023   BUN 16 01/04/2023   NA 137 01/04/2023   K 3.2 (L) 01/04/2023   CL 108 01/04/2023   CO2 22 01/04/2023    Lab Results  Component Value Date   ALT 42 01/04/2023   AST 34 01/04/2023   ALKPHOS 35 (L) 01/04/2023   BILITOT 0.6 01/04/2023    Lab Results  Component Value Date   TSH 1.100 01/13/2018     Assessment:   1. Need for hepatitis C screening test   2. Encounter for well woman exam with routine gynecological exam      Plan:  Blood tests: UTD. Breast self exam technique reviewed and patient encouraged to perform self-exam monthly. Contraception: tubal ligation. Discussed healthy lifestyle modifications. Mammogram  : Not age appropriate Pap smear  UTD . COVID vaccination status: Follow up in 1 year for annual exam   Hildred Laser, MD [facility]

## 2023-05-13 NOTE — Telephone Encounter (Signed)
Pt calling; has appt tomorrow and has started her period today and tomorrow will be rough.  Is she due for her pap smear tomorrow?  If so, she will reschedule.  [phone]  Adv pt she is not for a pap smear tomorrow so it is up to her as to whether or not she reschedules; pt will keep tomorrow's appt.  Last pap date given per pt request.

## 2023-05-14 ENCOUNTER — Encounter: Payer: Self-pay | Admitting: Obstetrics and Gynecology

## 2023-05-14 ENCOUNTER — Ambulatory Visit (INDEPENDENT_AMBULATORY_CARE_PROVIDER_SITE_OTHER): Payer: BC Managed Care – PPO | Admitting: Obstetrics and Gynecology

## 2023-05-14 VITALS — BP 105/71 | HR 81 | Resp 16 | Ht 68.0 in | Wt 214.9 lb

## 2023-05-14 DIAGNOSIS — Z01419 Encounter for gynecological examination (general) (routine) without abnormal findings: Secondary | ICD-10-CM

## 2023-05-14 DIAGNOSIS — E669 Obesity, unspecified: Secondary | ICD-10-CM

## 2023-05-14 DIAGNOSIS — N920 Excessive and frequent menstruation with regular cycle: Secondary | ICD-10-CM

## 2023-05-14 DIAGNOSIS — F419 Anxiety disorder, unspecified: Secondary | ICD-10-CM

## 2023-05-14 DIAGNOSIS — F3281 Premenstrual dysphoric disorder: Secondary | ICD-10-CM

## 2023-05-14 DIAGNOSIS — E782 Mixed hyperlipidemia: Secondary | ICD-10-CM

## 2023-05-14 DIAGNOSIS — Z1159 Encounter for screening for other viral diseases: Secondary | ICD-10-CM

## 2023-05-15 ENCOUNTER — Encounter: Payer: Self-pay | Admitting: Obstetrics and Gynecology

## 2023-11-10 ENCOUNTER — Ambulatory Visit
Admission: EM | Admit: 2023-11-10 | Discharge: 2023-11-10 | Disposition: A | Discharge status: Home / Self Care | Payer: BC Managed Care – PPO

## 2023-11-10 DIAGNOSIS — S61230A Puncture wound without foreign body of right index finger without damage to nail, initial encounter: Secondary | ICD-10-CM | POA: Diagnosis not present

## 2023-11-10 DIAGNOSIS — S61258A Open bite of other finger without damage to nail, initial encounter: Secondary | ICD-10-CM

## 2023-11-10 DIAGNOSIS — Z23 Encounter for immunization: Secondary | ICD-10-CM | POA: Diagnosis not present

## 2023-11-10 DIAGNOSIS — W5501XA Bitten by cat, initial encounter: Secondary | ICD-10-CM | POA: Diagnosis not present

## 2023-11-10 DIAGNOSIS — Z2914 Encounter for prophylactic rabies immune globin: Secondary | ICD-10-CM

## 2023-11-10 MED ORDER — RABIES VACCINE, PCEC IM SUSR
1.0000 mL | Freq: Once | INTRAMUSCULAR | Status: AC
  Administered 2023-11-10: 1 mL via INTRAMUSCULAR

## 2023-11-10 MED ORDER — RABIES IMMUNE GLOBULIN 150 UNIT/ML IM INJ
20.0000 [IU]/kg | INJECTION | Freq: Once | INTRAMUSCULAR | Status: AC
  Administered 2023-11-10: 1950 [IU]

## 2023-11-10 MED ORDER — AMOXICILLIN-POT CLAVULANATE 875-125 MG PO TABS: 1.0000 | ORAL_TABLET | Freq: Two times a day (BID) | ORAL | 0 refills | Status: AC

## 2023-11-10 MED ORDER — TETANUS-DIPHTH-ACELL PERTUSSIS 5-2.5-18.5 LF-MCG/0.5 IM SUSY
0.5000 mL | PREFILLED_SYRINGE | Freq: Once | INTRAMUSCULAR | Status: AC
  Administered 2023-11-10: 0.5 mL via INTRAMUSCULAR

## 2023-11-10 NOTE — ED Notes (Signed)
Appointments scheduled for patient to receive follow-up rabies vaccines. Reviewed with patient.

## 2023-11-10 NOTE — Discharge Instructions (Addendum)
Recommend start Augmentin 875mg  twice a day for 7 days- take with food. Continue to wash wound area with soap and water. May cover with bandaid as needed. Continue to monitor area. If any increased redness or swelling occurs with discharge, return for recheck. You were given an updated Tetanus shot today as well as Rabies Immune globulin and your first rabies shot. You will need to return in 3 days for your next rabies shot.

## 2023-11-10 NOTE — ED Triage Notes (Addendum)
Patient to Urgent Care with complaints of fever/ cat bite.   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 Has some redness and pain when bending her finger. Woke up today with fever of 101.7.  Incident occurred yesterday afternoon. Cat has been taken to [facility].   Last TDAP 2018.
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
## 2023-11-13 ENCOUNTER — Ambulatory Visit: Payer: BC Managed Care – PPO

## 2023-11-16 ENCOUNTER — Encounter: Payer: Self-pay | Admitting: Obstetrics and Gynecology

## 2023-11-16 ENCOUNTER — Other Ambulatory Visit: Payer: Self-pay

## 2023-11-16 MED ORDER — ZEPBOUND 2.5 MG/0.5ML ~~LOC~~ SOAJ
2.5000 mg | SUBCUTANEOUS | 1 refills | Status: DC
  Filled 2023-11-16: qty 2, 28d supply, fill #0
  Filled 2024-01-08 – 2024-01-14 (×2): qty 2, 28d supply, fill #1

## 2023-11-17 ENCOUNTER — Ambulatory Visit: Payer: BC Managed Care – PPO

## 2023-11-24 ENCOUNTER — Ambulatory Visit: Payer: BC Managed Care – PPO

## 2024-01-11 ENCOUNTER — Other Ambulatory Visit: Payer: Self-pay

## 2024-01-11 ENCOUNTER — Encounter: Payer: Self-pay | Admitting: Pharmacist

## 2024-01-14 ENCOUNTER — Other Ambulatory Visit: Payer: Self-pay

## 2024-01-15 ENCOUNTER — Other Ambulatory Visit: Payer: Self-pay | Admitting: Physician Assistant

## 2024-01-15 DIAGNOSIS — R1031 Right lower quadrant pain: Secondary | ICD-10-CM

## 2024-01-22 ENCOUNTER — Other Ambulatory Visit: Payer: BC Managed Care – PPO

## 2024-01-22 ENCOUNTER — Ambulatory Visit
Admission: RE | Admit: 2024-01-22 | Discharge: 2024-01-22 | Disposition: A | Discharge status: Home / Self Care | Payer: BC Managed Care – PPO | Source: Ambulatory Visit | Attending: Physician Assistant | Admitting: Physician Assistant

## 2024-01-22 DIAGNOSIS — R1031 Right lower quadrant pain: Secondary | ICD-10-CM | POA: Insufficient documentation

## 2024-01-22 MED ORDER — IOHEXOL 300 MG/ML  SOLN
100.0000 mL | Freq: Once | INTRAMUSCULAR | Status: AC | PRN
  Administered 2024-01-22: 100 mL via INTRAVENOUS

## 2024-02-05 ENCOUNTER — Other Ambulatory Visit: Payer: Self-pay | Admitting: Internal Medicine

## 2024-02-05 DIAGNOSIS — R1031 Right lower quadrant pain: Secondary | ICD-10-CM

## 2024-02-09 ENCOUNTER — Ambulatory Visit
Admission: RE | Admit: 2024-02-09 | Discharge: 2024-02-09 | Disposition: A | Discharge status: Home / Self Care | Payer: BC Managed Care – PPO | Source: Ambulatory Visit | Attending: Internal Medicine | Admitting: Internal Medicine

## 2024-02-09 DIAGNOSIS — R1032 Left lower quadrant pain: Secondary | ICD-10-CM | POA: Insufficient documentation

## 2024-02-09 DIAGNOSIS — R1031 Right lower quadrant pain: Secondary | ICD-10-CM | POA: Diagnosis present

## 2024-02-28 ENCOUNTER — Other Ambulatory Visit: Payer: Self-pay

## 2024-02-29 ENCOUNTER — Other Ambulatory Visit: Payer: Self-pay

## 2024-03-01 ENCOUNTER — Other Ambulatory Visit: Payer: Self-pay

## 2024-03-01 MED ORDER — ZEPBOUND 2.5 MG/0.5ML ~~LOC~~ SOAJ
2.5000 mg | SUBCUTANEOUS | 1 refills | Status: DC
  Filled 2024-03-01: qty 2, 28d supply, fill #0

## 2024-03-21 ENCOUNTER — Other Ambulatory Visit
Admission: RE | Admit: 2024-03-21 | Discharge: 2024-03-21 | Disposition: A | Discharge status: Home / Self Care | Source: Ambulatory Visit | Attending: Family Medicine | Admitting: Family Medicine

## 2024-03-21 DIAGNOSIS — R112 Nausea with vomiting, unspecified: Secondary | ICD-10-CM | POA: Diagnosis present

## 2024-03-21 DIAGNOSIS — R1013 Epigastric pain: Secondary | ICD-10-CM | POA: Insufficient documentation

## 2024-03-21 LAB — TROPONIN I (HIGH SENSITIVITY): Troponin I (High Sensitivity): <2 ng/L (ref <18)

## 2024-04-28 ENCOUNTER — Encounter: Payer: Self-pay | Admitting: Gastroenterology

## 2024-05-13 ENCOUNTER — Ambulatory Visit
Admission: RE | Admit: 2024-05-13 | Discharge: 2024-05-13 | Disposition: A | Discharge status: Home / Self Care | Source: Ambulatory Visit | Attending: Gastroenterology | Admitting: Gastroenterology

## 2024-05-13 ENCOUNTER — Other Ambulatory Visit: Payer: Self-pay | Admitting: Gastroenterology

## 2024-05-13 DIAGNOSIS — R1031 Right lower quadrant pain: Secondary | ICD-10-CM

## 2024-05-13 DIAGNOSIS — R197 Diarrhea, unspecified: Secondary | ICD-10-CM

## 2024-05-13 MED ORDER — IOHEXOL 300 MG/ML  SOLN
100.0000 mL | Freq: Once | INTRAMUSCULAR | Status: AC | PRN
  Administered 2024-05-13: 100 mL via INTRAVENOUS

## 2024-05-19 ENCOUNTER — Other Ambulatory Visit: Payer: Self-pay | Admitting: Gastroenterology

## 2024-05-19 DIAGNOSIS — K921 Melena: Secondary | ICD-10-CM

## 2024-05-24 ENCOUNTER — Other Ambulatory Visit

## 2024-05-27 ENCOUNTER — Ambulatory Visit
Admission: RE | Admit: 2024-05-27 | Discharge: 2024-05-27 | Disposition: A | Discharge status: Home / Self Care | Source: Ambulatory Visit | Attending: Gastroenterology | Admitting: Gastroenterology

## 2024-05-27 DIAGNOSIS — K921 Melena: Secondary | ICD-10-CM | POA: Diagnosis present

## 2024-06-03 ENCOUNTER — Encounter: Payer: Self-pay | Admitting: Gastroenterology

## 2024-06-24 ENCOUNTER — Ambulatory Visit: Admit: 2024-06-24 | Admitting: Gastroenterology

## 2024-06-24 SURGERY — EGD (ESOPHAGOGASTRODUODENOSCOPY)
Anesthesia: General

## 2024-07-13 ENCOUNTER — Ambulatory Visit: Admission: EM | Admit: 2024-07-13 | Discharge: 2024-07-13 | Disposition: A | Discharge status: Home / Self Care

## 2024-07-13 DIAGNOSIS — R42 Dizziness and giddiness: Secondary | ICD-10-CM | POA: Diagnosis not present

## 2024-07-13 DIAGNOSIS — T50905A Adverse effect of unspecified drugs, medicaments and biological substances, initial encounter: Secondary | ICD-10-CM | POA: Diagnosis not present

## 2024-07-13 DIAGNOSIS — R002 Palpitations: Secondary | ICD-10-CM | POA: Diagnosis not present

## 2024-07-13 DIAGNOSIS — R110 Nausea: Secondary | ICD-10-CM | POA: Diagnosis not present

## 2024-07-13 LAB — POCT URINE DIPSTICK
Bilirubin, UA: NEGATIVE
Blood, UA: NEGATIVE
Glucose, UA: NEGATIVE mg/dL
Ketones, POC UA: NEGATIVE mg/dL
Leukocytes, UA: NEGATIVE
Nitrite, UA: NEGATIVE
Spec Grav, UA: 1.02 (ref 1.010–1.025)
Urobilinogen, UA: 0.2 U/dL
pH, UA: 8 (ref 5.0–8.0)

## 2024-07-13 LAB — POCT URINE PREGNANCY: Preg Test, Ur: NEGATIVE

## 2024-07-13 LAB — GLUCOSE, POCT (MANUAL RESULT ENTRY): POC Glucose: 97 mg/dL (ref 70–99)

## 2024-07-13 MED ORDER — ONDANSETRON 4 MG PO TBDP
4.0000 mg | ORAL_TABLET | Freq: Once | ORAL | Status: AC
  Administered 2024-07-13: 4 mg via ORAL

## 2024-07-13 MED ORDER — ONDANSETRON 8 MG PO TBDP: 8.0000 mg | ORAL_TABLET | Freq: Three times a day (TID) | ORAL | 0 refills | Status: AC | PRN

## 2024-07-13 NOTE — Discharge Instructions (Addendum)
 You were evaluated today for symptoms including dizziness, nausea, internal tremors, muscle tightness, palpitations, poor sleep, headaches, and muscle aches. These symptoms appear to be related to recent changes in your psychiatric medications, including stopping Trileptal, starting Abilify, and adjustments to Cymbalta . Blood work has been ordered to check for any significant changes since your last labs in May, including thyroid  function and electrolytes. A urine sample and orthostatic blood pressure measurements were also collected today.  You were given medication in the clinic to help with nausea and were prescribed the same to take as needed at home. To help manage your symptoms, stay well hydrated, rest as needed, and avoid any sudden position changes that could worsen dizziness. Eat small, bland meals throughout the day to reduce nausea, and try relaxation techniques such as deep breathing or gentle stretching to ease internal tremors and muscle tension.  Please follow up with your psychiatrist if your symptoms do not improve within 7 to 10 days or if you have concerns about your current medication regimen. Go to the emergency department if your symptoms worsen, if you experience chest pain, fainting, confusion, or if you're unable to keep fluids down.

## 2024-07-13 NOTE — ED Triage Notes (Signed)
 Patient to Urgent Care with complaints of nausea/ dizziness/ decreased sleep. States she feels that her HR shoots up when she stands. Headaches. Denies any CP/ SHOB.   Psych med changes x5 weeks- reports on her 4th change currently.   Currently taking 200mg  zoloft . Also 3 days in of taking 2mg  ability.   Decreased cymbalta  30mg  (reports she decreased from 60mg ).

## 2024-07-13 NOTE — ED Provider Notes (Signed)
 Deborah Fields    CSN: [id number] Arrival date & time: 07/13/24  1201      History   Chief Complaint Chief Complaint  Patient presents with   Nausea   Dizziness    HPI Deborah Fields is a 38 y.o. female.   Discussed the use of AI scribe software for clinical note transcription with the patient, who gave verbal consent to proceed.   The patient presents with complaints of dizziness, nausea, and poor sleep, which have been ongoing for the past five weeks coinciding with multiple medication changes. The patient has a history of psychiatric medication use and has recently undergone four medication changes in the last five weeks.  The patient reports feeling dizzy all the time, which she attributes to the recent medication changes. She describes feeling drugged and like she's crawling out of her skin. Associated symptoms include internal tremors, muscle tightness, palpitations, and headaches. The patient also experiences muscle aches and nausea without vomiting. She reports blurry vision with the headaches. The patient's appetite is poor, but she maintains hydration with electrolyte drinks. The patient denies any thoughts of self-harm or harm to others.  The patient's medication changes include transitioning from Trileptal to Abilify (started 3 days ago), discontinuing Trileptal 2 days ago, and continuing Zoloft  and Cymbalta . She attempted to taper off Cymbalta  (which she had been taking for six years) but experienced severe withdrawal symptoms, leading to a dosage adjustment from 60 mg to 30 mg to zero, then back to 30 mg. Her psychiatrist recently increased her Klonopin  dosage, believing the symptoms to be anxiety-related.  The patient expresses concern about a potential underlying medical issue. She was last seen by her nurse practitioner on June 17th, prior to any medication changes. Her psychiatrist manages all her psychiatric medications and expects improvement within 5 to  7 days on Abilify.  The following portions of the patient's history were reviewed and updated as appropriate: allergies, current medications, past family history, past medical history, past social history, past surgical history, and problem list.    Past Medical History:  Diagnosis Date   Allergic rhinitis    Anemia affecting pregnancy    Asthma    Erosive esophagitis    GAD (generalized anxiety disorder)    a.) followed by psychiatry; b.) on BZO (clonazepam ) PRN + sertraline    GERD (gastroesophageal reflux disease)    Headache    Hiatal hernia    History of kidney stones    History of kidney stones    HLD (hyperlipidemia)    IBS (irritable bowel syndrome) 01/2014   MDD (major depressive disorder)    a.) followed by psychiatry; b.) on SSRI (sertraline )   MRSA (methicillin resistant Staphylococcus aureus)    Panic disorder without agoraphobia    a.) followed by psychiatry   Pneumonia 2016   PONV (postoperative nausea and vomiting)    Prolonged QT interval    Tachycardia     Patient Active Problem List   Diagnosis Date Noted   Acute pancreatitis 01/04/2023   Pancreatitis, acute 01/03/2023   Gastritis 01/03/2023   Hiatal hernia with GERD 01/03/2023   Anxiety and depression 04/24/2017   H/O cesarean section 04/24/2017   Recurrent UTI 01/16/2016   Headache 12/21/2015   MRSA (methicillin resistant Staphylococcus aureus)    Asthma    Allergic rhinitis    Panic disorder without agoraphobia    IBS (irritable bowel syndrome)    Neuropathy, 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    Past Surgical History:  Procedure  Laterality Date   CESAREAN SECTION     CESAREAN SECTION WITH BILATERAL TUBAL LIGATION Bilateral 10/30/2017   Procedure: CESAREAN SECTION WITH BILATERAL TUBAL LIGATION;  Surgeon: Connell Davies, MD;  Location: [facility] ORS;  Service: Obstetrics;  Laterality: Bilateral;   CHOLECYSTECTOMY  01/15/2023   COLONOSCOPY  01/2014   showed IBS, Hiatal Hernia, Erosive reflux  esophagitis   ESOPHAGOGASTRODUODENOSCOPY     OVARIAN CYST REMOVAL Right    PLANTAR FASCIA SURGERY N/A    TONSILLECTOMY     TONSILLECTOMY     TUBAL LIGATION      OB History     Gravida  2   Para  2   Term  2   Preterm      AB      Living  2      SAB      IAB      Ectopic      Multiple  0   Live Births  2        Obstetric Comments  G1- Fetal tachycardia, arrest of descent in 2nd stage (pushed for 3 hrs).           Home Medications    Prior to Admission medications   Medication Sig Start Date End Date Taking? Authorizing Provider  ARIPiprazole (ABILIFY) 2 MG tablet Take 2 mg by mouth daily. 02/01/24  Yes [provider]  ondansetron  (ZOFRAN -ODT) 8 MG disintegrating tablet Take 1 tablet (8 mg total) by mouth every 8 (eight) hours as needed for nausea or vomiting. 07/13/24  Yes Iola Lukes, FNP  albuterol  (VENTOLIN  HFA) 108 (90 Base) MCG/ACT inhaler Inhale into the lungs. 11/04/23 11/03/24  [provider]  clonazePAM  (KLONOPIN ) 0.5 MG tablet Take 0.5 mg by mouth daily as needed for anxiety.    [provider]  DEXILANT  60 MG capsule TAKE 1 CAPSULE BY MOUTH EVERY DAY Patient taking differently: Take 60 mg by mouth every evening. 07/18/21   Jinny Carmine, MD  DULoxetine  (CYMBALTA ) 60 MG capsule Take 30 mg by mouth every evening.    [provider]  fenofibrate 160 MG tablet Take 160 mg by mouth every evening.    [provider]  metoprolol  tartrate (LOPRESSOR ) 25 MG tablet Take 12.5-25 mg by mouth daily as needed (heartrate control).    [provider]  pravastatin (PRAVACHOL) 40 MG tablet Take 40 mg by mouth at bedtime.    [provider]  sertraline  (ZOLOFT ) 100 MG tablet Take 200 mg by mouth at bedtime.    [provider]  sucralfate (CARAFATE) 1 g tablet Take 1 g by mouth 4 (four) times daily as needed (GI symptoms).    [provider]  Vitamin D , Ergocalciferol , (DRISDOL ) 1.25  MG (50000 UNIT) CAPS capsule Take 1 capsule (50,000 Units total) by mouth every 7 (seven) days. 02/14/23   Connell Davies, MD    Family History Family History  Problem Relation Age of Onset   Diabetes Mother    Hypertension Mother    Hyperlipidemia Mother    COPD Mother    Heart disease Father    Hypertension Father    Hyperlipidemia Father    COPD Maternal Grandmother    COPD Maternal Grandfather    Cancer Paternal Grandfather        prostate   Stroke Neg Hx    Kidney disease Neg Hx    Bladder Cancer Neg Hx     Social History Social History   Tobacco Use   Smoking  status: Former    Current packs/day: 0.00    Average packs/day: 0.5 packs/day for 5.0 years (2.5 ttl pk-yrs)    Types: Cigarettes    Start date: 2003    Quit date: 2008    Years since quitting: 17.5    Passive exposure: Never   Smokeless tobacco: Never   Tobacco comments:    quit 10 years  Vaping Use   Vaping status: Never Used  Substance Use Topics   Alcohol use: No    Alcohol/week: 0.0 standard drinks of alcohol   Drug use: No     Allergies   Sulfa antibiotics and Sulfasalazine   Review of Systems Review of Systems  Constitutional:  Positive for appetite change. Negative for chills, diaphoresis, fatigue and fever.  HENT: Negative.    Eyes:  Positive for photophobia.  Respiratory:  Negative for cough and shortness of breath.   Gastrointestinal:  Positive for nausea. Negative for abdominal pain, constipation and vomiting.  Genitourinary:  Negative for dysuria.  Musculoskeletal:  Positive for myalgias. Negative for arthralgias, back pain, gait problem and joint swelling.  Neurological:  Positive for dizziness and headaches. Negative for tremors, weakness and numbness.  Psychiatric/Behavioral:  Positive for sleep disturbance. Negative for hallucinations and suicidal ideas. The patient is nervous/anxious.   All other systems reviewed and are negative.    Physical Exam Triage Vital Signs ED  Triage Vitals  Encounter Vitals Group     BP 07/13/24 1318 120/84     Girls Systolic BP Percentile --      Girls Diastolic BP Percentile --      Boys Systolic BP Percentile --      Boys Diastolic BP Percentile --      Pulse Rate 07/13/24 1318 (!) 103     Resp 07/13/24 1318 18     Temp 07/13/24 1318 98.6 F (37 C)     Temp src --      SpO2 07/13/24 1318 98 %     Weight --      Height --      Head Circumference --      Peak Flow --      Pain Score 07/13/24 1330 5     Pain Loc --      Pain Education --      Exclude from Growth Chart --    No data found.  Updated Vital Signs BP 120/84   Pulse (!) 103   Temp 98.6 F (37 C)   Resp 18   LMP 07/03/2024   SpO2 98%   Visual Acuity Right Eye Distance:   Left Eye Distance:   Bilateral Distance:    Right Eye Near:   Left Eye Near:    Bilateral Near:     Physical Exam Vitals reviewed.  Constitutional:      General: She is awake. She is not in acute distress.    Appearance: Normal appearance. She is well-developed. She is not ill-appearing, toxic-appearing or diaphoretic.  HENT:     Head: Normocephalic.     Right Ear: Hearing normal.     Left Ear: Hearing normal.     Nose: Nose normal.     Mouth/Throat:     Mouth: Mucous membranes are moist.  Eyes:     General: Vision grossly intact.     Conjunctiva/sclera: Conjunctivae normal.  Cardiovascular:     Rate and Rhythm: Normal rate and regular rhythm.     Heart sounds: Normal heart sounds.  Pulmonary:  Effort: Pulmonary effort is normal.     Breath sounds: Normal breath sounds and air entry.  Abdominal:     General: Bowel sounds are normal.     Palpations: Abdomen is soft.     Tenderness: There is no abdominal tenderness.  Musculoskeletal:        General: Normal range of motion.     Cervical back: Full passive range of motion without pain, normal range of motion and neck supple.  Skin:    General: Skin is warm and dry.  Neurological:     General: No focal  deficit present.     Mental Status: She is alert and oriented to person, place, and time.     Sensory: Sensation is intact. No sensory deficit.     Motor: Motor function is intact. No weakness.     Coordination: Coordination is intact.     Gait: Gait is intact.  Psychiatric:        Attention and Perception: Attention normal. She does not perceive auditory or visual hallucinations.        Mood and Affect: Mood normal. Affect is tearful.        Speech: Speech normal.        Behavior: Behavior normal. Behavior is cooperative.        Thought Content: Thought content does not include homicidal or suicidal ideation.        Cognition and Memory: Cognition normal.      UC Treatments / Results  Labs (all labs ordered are listed, but only abnormal results are displayed) Labs Reviewed  CBC WITH DIFFERENTIAL/PLATELET  COMPREHENSIVE METABOLIC PANEL WITH GFR  TSH  GLUCOSE, POCT (MANUAL RESULT ENTRY)  POCT URINE PREGNANCY  POCT URINE DIPSTICK    EKG   Radiology No results found.  Procedures ED EKG  Date/Time: 07/13/2024 2:44 PM  Performed by: Iola Lukes, FNP Authorized by: Iola Lukes, FNP   Rate:    ECG rate:  93   ECG rate assessment: normal   Rhythm:    Rhythm: sinus rhythm   Ectopy:    Ectopy: none   QRS:    QRS axis:  Normal   QRS intervals: Narrow.   QRS conduction: normal   ST segments:    ST segments:  Normal T waves:    T waves: normal   Q waves:    Abnormal Q-waves: not present    (including critical care time)  Medications Ordered in UC Medications  ondansetron  (ZOFRAN -ODT) disintegrating tablet 4 mg (has no administration in time range)    Initial Impression / Assessment and Plan / UC Course  I have reviewed the triage vital signs and the nursing notes.  Pertinent labs & imaging results that were available during my care of the patient were reviewed by me and considered in my medical decision making (see chart for details).     Patient  presents with dizziness, nausea, internal tremors, muscle tightness, palpitations, poor sleep, headaches, and muscle aches following recent psychiatric medication changes, including discontinuation of Trileptal, initiation of Abilify three days ago, and recent adjustments to Cymbalta . Patient has undergone four medication changes in the past five weeks and remains on Cymbalta  and Zoloft . History of severe withdrawal symptoms with prior Cymbalta  taper noted. Psychiatrist has increased Klonopin  dosage, attributing some symptoms to anxiety. Labs from May were largely unremarkable, with slightly elevated liver enzymes consistent with patient's baseline and normal thyroid  function in November 2024. Blood work was ordered today, including CBC, CMP, and TSH,  along with a urine sample and orthostatic vitals. Antiemetic administered in clinic and prescribed PRN for ongoing nausea. Patient advised to follow up with psychiatry if symptoms do not improve in 7-10 days and to seek emergency care if symptoms worsen or become unmanageable. Today's evaluation has revealed no signs of a dangerous process. Discussed diagnosis with patient and/or guardian. Patient and/or guardian aware of their diagnosis, possible red flag symptoms to watch out for and need for close follow up. Patient and/or guardian understands verbal and written discharge instructions. Patient and/or guardian comfortable with plan and disposition.  Patient and/or guardian has a clear mental status at this time, good insight into illness (after discussion and teaching) and has clear judgment to make decisions regarding their care  Documentation was completed with the aid of voice recognition software. Transcription may contain typographical errors. Final Clinical Impressions(s) / UC Diagnoses   Final diagnoses:  Dizziness  Adverse effect of drug, initial encounter  Nausea without vomiting  Palpitations     Discharge Instructions      You were  evaluated today for symptoms including dizziness, nausea, internal tremors, muscle tightness, palpitations, poor sleep, headaches, and muscle aches. These symptoms appear to be related to recent changes in your psychiatric medications, including stopping Trileptal, starting Abilify, and adjustments to Cymbalta . Blood work has been ordered to check for any significant changes since your last labs in May, including thyroid  function and electrolytes. A urine sample and orthostatic blood pressure measurements were also collected today.  You were given medication in the clinic to help with nausea and were prescribed the same to take as needed at home. To help manage your symptoms, stay well hydrated, rest as needed, and avoid any sudden position changes that could worsen dizziness. Eat small, bland meals throughout the day to reduce nausea, and try relaxation techniques such as deep breathing or gentle stretching to ease internal tremors and muscle tension.  Please follow up with your psychiatrist if your symptoms do not improve within 7 to 10 days or if you have concerns about your current medication regimen. Go to the emergency department if your symptoms worsen, if you experience chest pain, fainting, confusion, or if you're unable to keep fluids down.      ED Prescriptions     Medication Sig Dispense Auth. Provider   ondansetron  (ZOFRAN -ODT) 8 MG disintegrating tablet Take 1 tablet (8 mg total) by mouth every 8 (eight) hours as needed for nausea or vomiting. 12 tablet Iola Lukes, FNP      PDMP not reviewed this encounter.   Iola Lukes, [place] 07/13/24 [phone]

## 2024-07-14 ENCOUNTER — Ambulatory Visit: Payer: Self-pay

## 2024-07-14 LAB — CBC WITH DIFFERENTIAL/PLATELET
Basophils Absolute: 0 x10E3/uL (ref 0.0–0.2)
Basos: 0 %
EOS (ABSOLUTE): 0.1 x10E3/uL (ref 0.0–0.4)
Eos: 2 %
Hematocrit: 38.2 % (ref 34.0–46.6)
Hemoglobin: 13.1 g/dL (ref 11.1–15.9)
Immature Grans (Abs): 0 x10E3/uL (ref 0.0–0.1)
Immature Granulocytes: 0 %
Lymphocytes Absolute: 1.5 x10E3/uL (ref 0.7–3.1)
Lymphs: 29 %
MCH: 30.5 pg (ref 26.6–33.0)
MCHC: 34.3 g/dL (ref 31.5–35.7)
MCV: 89 fL (ref 79–97)
Monocytes Absolute: 0.3 x10E3/uL (ref 0.1–0.9)
Monocytes: 6 %
Neutrophils Absolute: 3.1 x10E3/uL (ref 1.4–7.0)
Neutrophils: 63 %
Platelets: 312 x10E3/uL (ref 150–450)
RBC: 4.29 x10E6/uL (ref 3.77–5.28)
RDW: 12.3 % (ref 11.7–15.4)
WBC: 5 x10E3/uL (ref 3.4–10.8)

## 2024-07-14 LAB — COMPREHENSIVE METABOLIC PANEL WITH GFR
ALT: 40 IU/L — ABNORMAL HIGH (ref 0–32)
AST: 36 IU/L (ref 0–40)
Albumin: 4.8 g/dL (ref 3.9–4.9)
Alkaline Phosphatase: 43 IU/L — ABNORMAL LOW (ref 44–121)
BUN/Creatinine Ratio: 13 (ref 9–23)
BUN: 11 mg/dL (ref 6–24)
Bilirubin Total: 0.3 mg/dL (ref 0.0–1.2)
CO2: 17 mmol/L — ABNORMAL LOW (ref 20–29)
Calcium: 9.7 mg/dL (ref 8.7–10.2)
Chloride: 104 mmol/L (ref 96–106)
Creatinine, Ser: 0.84 mg/dL (ref 0.57–1.00)
Globulin, Total: 2.1 g/dL (ref 1.5–4.5)
Glucose: 110 mg/dL — ABNORMAL HIGH (ref 70–99)
Potassium: 3.8 mmol/L (ref 3.5–5.2)
Sodium: 139 mmol/L (ref 134–144)
Total Protein: 6.9 g/dL (ref 6.0–8.5)
eGFR: 90 mL/min/1.73 (ref >59)

## 2024-07-14 LAB — TSH: TSH: 0.889 u[IU]/mL (ref 0.450–4.500)

## 2024-07-22 ENCOUNTER — Other Ambulatory Visit: Payer: Self-pay

## 2024-07-22 ENCOUNTER — Encounter: Payer: Self-pay | Admitting: Gastroenterology

## 2024-07-22 ENCOUNTER — Ambulatory Visit: Admitting: Anesthesiology

## 2024-07-22 ENCOUNTER — Encounter
Admission: RE | Disposition: A | Discharge status: Home / Self Care | Payer: Self-pay | Source: Home / Self Care | Attending: Gastroenterology

## 2024-07-22 ENCOUNTER — Ambulatory Visit
Admission: RE | Admit: 2024-07-22 | Discharge: 2024-07-22 | Disposition: A | Discharge status: Home / Self Care | Attending: Gastroenterology | Admitting: Gastroenterology

## 2024-07-22 DIAGNOSIS — K5939 Other megacolon: Secondary | ICD-10-CM | POA: Insufficient documentation

## 2024-07-22 DIAGNOSIS — Z9049 Acquired absence of other specified parts of digestive tract: Secondary | ICD-10-CM | POA: Diagnosis not present

## 2024-07-22 DIAGNOSIS — F32A Depression, unspecified: Secondary | ICD-10-CM | POA: Diagnosis not present

## 2024-07-22 DIAGNOSIS — R1084 Generalized abdominal pain: Secondary | ICD-10-CM | POA: Diagnosis not present

## 2024-07-22 DIAGNOSIS — K625 Hemorrhage of anus and rectum: Secondary | ICD-10-CM | POA: Insufficient documentation

## 2024-07-22 DIAGNOSIS — J45909 Unspecified asthma, uncomplicated: Secondary | ICD-10-CM | POA: Insufficient documentation

## 2024-07-22 DIAGNOSIS — Z87891 Personal history of nicotine dependence: Secondary | ICD-10-CM | POA: Diagnosis not present

## 2024-07-22 DIAGNOSIS — F419 Anxiety disorder, unspecified: Secondary | ICD-10-CM | POA: Insufficient documentation

## 2024-07-22 DIAGNOSIS — Z79899 Other long term (current) drug therapy: Secondary | ICD-10-CM | POA: Insufficient documentation

## 2024-07-22 DIAGNOSIS — K2970 Gastritis, unspecified, without bleeding: Secondary | ICD-10-CM | POA: Diagnosis not present

## 2024-07-22 DIAGNOSIS — K648 Other hemorrhoids: Secondary | ICD-10-CM | POA: Insufficient documentation

## 2024-07-22 DIAGNOSIS — K219 Gastro-esophageal reflux disease without esophagitis: Secondary | ICD-10-CM | POA: Diagnosis not present

## 2024-07-22 HISTORY — PX: ESOPHAGOGASTRODUODENOSCOPY: SHX5428

## 2024-07-22 HISTORY — PX: COLONOSCOPY: SHX5424

## 2024-07-22 LAB — POCT PREGNANCY, URINE: Preg Test, Ur: NEGATIVE

## 2024-07-22 SURGERY — COLONOSCOPY
Anesthesia: General

## 2024-07-22 MED ORDER — KETOROLAC TROMETHAMINE 30 MG/ML IJ SOLN
INTRAMUSCULAR | Status: AC
  Filled 2024-07-22: qty 1

## 2024-07-22 MED ORDER — ONDANSETRON HCL 4 MG/2ML IJ SOLN
INTRAMUSCULAR | Status: AC
Start: 2024-07-22 — End: 2024-07-22
  Filled 2024-07-22: qty 2

## 2024-07-22 MED ORDER — PROPOFOL 500 MG/50ML IV EMUL
INTRAVENOUS | Status: DC | PRN
  Administered 2024-07-22: 75 ug/kg/min via INTRAVENOUS

## 2024-07-22 MED ORDER — DEXMEDETOMIDINE HCL IN NACL 80 MCG/20ML IV SOLN
INTRAVENOUS | Status: DC | PRN
  Administered 2024-07-22: 8 ug via INTRAVENOUS
  Administered 2024-07-22: 12 ug via INTRAVENOUS

## 2024-07-22 MED ORDER — KETAMINE HCL 50 MG/5ML IJ SOSY
PREFILLED_SYRINGE | INTRAMUSCULAR | Status: AC
  Filled 2024-07-22: qty 5

## 2024-07-22 MED ORDER — PROPOFOL 10 MG/ML IV BOLUS
INTRAVENOUS | Status: AC
  Filled 2024-07-22: qty 20

## 2024-07-22 MED ORDER — LIDOCAINE HCL (PF) 2 % IJ SOLN
INTRAMUSCULAR | Status: AC
  Filled 2024-07-22: qty 5

## 2024-07-22 MED ORDER — GLYCOPYRROLATE 0.2 MG/ML IJ SOLN
INTRAMUSCULAR | Status: AC
  Filled 2024-07-22: qty 1

## 2024-07-22 MED ORDER — ONDANSETRON HCL 4 MG/2ML IJ SOLN
INTRAMUSCULAR | Status: DC | PRN
  Administered 2024-07-22: 2 mg via INTRAVENOUS

## 2024-07-22 MED ORDER — EPHEDRINE 5 MG/ML INJ
INTRAVENOUS | Status: AC
Start: 2024-07-22 — End: 2024-07-22
  Filled 2024-07-22: qty 10

## 2024-07-22 MED ORDER — PROPOFOL 10 MG/ML IV BOLUS
INTRAVENOUS | Status: DC | PRN
  Administered 2024-07-22 (×2): 50 mg via INTRAVENOUS

## 2024-07-22 MED ORDER — ONDANSETRON HCL 4 MG/2ML IJ SOLN
INTRAMUSCULAR | Status: AC
  Filled 2024-07-22: qty 2

## 2024-07-22 MED ORDER — LIDOCAINE HCL (CARDIAC) PF 100 MG/5ML IV SOSY
PREFILLED_SYRINGE | INTRAVENOUS | Status: DC | PRN
  Administered 2024-07-22: 80 mg via INTRAVENOUS

## 2024-07-22 MED ORDER — PHENYLEPHRINE 80 MCG/ML (10ML) SYRINGE FOR IV PUSH (FOR BLOOD PRESSURE SUPPORT)
PREFILLED_SYRINGE | INTRAVENOUS | Status: AC
Start: 2024-07-22 — End: 2024-07-22
  Filled 2024-07-22: qty 10

## 2024-07-22 MED ORDER — SODIUM CHLORIDE 0.9 % IV SOLN: INTRAVENOUS | Status: DC

## 2024-07-22 MED ORDER — GLYCOPYRROLATE 0.2 MG/ML IJ SOLN
INTRAMUSCULAR | Status: DC | PRN
  Administered 2024-07-22: .2 mg via INTRAVENOUS

## 2024-07-22 MED ORDER — SUCCINYLCHOLINE CHLORIDE 200 MG/10ML IV SOSY
PREFILLED_SYRINGE | INTRAVENOUS | Status: AC
  Filled 2024-07-22: qty 10

## 2024-07-22 MED ORDER — SODIUM CHLORIDE (PF) 0.9 % IJ SOLN
INTRAMUSCULAR | Status: AC
  Filled 2024-07-22: qty 10

## 2024-07-22 NOTE — H&P (Signed)
 Pre-Procedure H&P   Patient ID: Deborah Fields is a 38 y.o. female.  Gastroenterology Provider: Elspeth Ozell Jungling, DO  PCP: Sherial Bail, MD  Date: 07/22/2024  HPI Deborah Fields is a 38 y.o. female who presents today for Esophagogastroduodenoscopy and Colonoscopy for Abdominal pain, nausea, bright red blood per rectum .  Patient has had longstanding abdominal pain.  She underwent cholecystectomy in 2022 with some improvement in her symptoms, but continues to have some nausea and abdominal discomfort.  Spells occur for 1 week a month.  Uses MiraLAX daily and has regular bowel meant without melena or hematochezia with history of IBS-C.  EGD performed in 2022 demonstrating H. pylori negative gastritis.  Celiac disease negative  Previous imaging and labs have not revealed the GI source to her continued symptoms outside of IBS.  She does note that her symptoms increase with stress and anxiety.  She follows with psychiatry who is helping address this with different medications.  Hemoglobin 13.1 MCV 89 platelets 212,000 alk phos 43 AST 36 ALT 40 TB 0.3 creatinine 0.84 Other stool workup has been negative.   Past Medical History:  Diagnosis Date   Allergic rhinitis    Anemia affecting pregnancy    Asthma    Erosive esophagitis    GAD (generalized anxiety disorder)    a.) followed by psychiatry; b.) on BZO (clonazepam ) PRN + sertraline    GERD (gastroesophageal reflux disease)    Headache    Hiatal hernia    History of kidney stones    History of kidney stones    HLD (hyperlipidemia)    IBS (irritable bowel syndrome) 01/2014   MDD (major depressive disorder)    a.) followed by psychiatry; b.) on SSRI (sertraline )   MRSA (methicillin resistant Staphylococcus aureus)    Panic disorder without agoraphobia    a.) followed by psychiatry   Pneumonia 2016   PONV (postoperative nausea and vomiting)    Prolonged QT interval    Tachycardia     Past Surgical History:   Procedure Laterality Date   CESAREAN SECTION     CESAREAN SECTION WITH BILATERAL TUBAL LIGATION Bilateral 10/30/2017   Procedure: CESAREAN SECTION WITH BILATERAL TUBAL LIGATION;  Surgeon: Connell Davies, MD;  Location: [facility] ORS;  Service: Obstetrics;  Laterality: Bilateral;   CHOLECYSTECTOMY  01/15/2023   COLONOSCOPY  01/2014   showed IBS, Hiatal Hernia, Erosive reflux esophagitis   ESOPHAGOGASTRODUODENOSCOPY     OVARIAN CYST REMOVAL Right    PLANTAR FASCIA SURGERY N/A    TONSILLECTOMY     TONSILLECTOMY     TUBAL LIGATION      Family History No h/o GI disease or malignancy  Review of Systems  Constitutional:  Negative for activity change, appetite change, chills, diaphoresis, fatigue, fever and unexpected weight change.  HENT:  Negative for trouble swallowing and voice change.   Respiratory:  Negative for shortness of breath and wheezing.   Cardiovascular:  Negative for chest pain, palpitations and leg swelling.  Gastrointestinal:  Positive for abdominal pain, constipation and nausea. Negative for abdominal distention, anal bleeding, blood in stool, diarrhea, rectal pain and vomiting.  Musculoskeletal:  Negative for arthralgias and myalgias.  Skin:  Negative for color change and pallor.  Neurological:  Negative for dizziness, syncope and weakness.  All other systems reviewed and are negative.    Medications No current facility-administered medications on file prior to encounter.   Current Outpatient Medications on File Prior to Encounter  Medication Sig Dispense Refill  DULoxetine  (CYMBALTA ) 60 MG capsule Take 30 mg by mouth every evening.     fenofibrate 160 MG tablet Take 160 mg by mouth every evening.     metoprolol  tartrate (LOPRESSOR ) 25 MG tablet Take 12.5-25 mg by mouth daily as needed (heartrate control).     pravastatin (PRAVACHOL) 40 MG tablet Take 40 mg by mouth at bedtime.     sertraline  (ZOLOFT ) 100 MG tablet Take 200 mg by mouth at bedtime.  3   sucralfate  (CARAFATE) 1 g tablet Take 1 g by mouth 4 (four) times daily as needed (GI symptoms).     Vitamin D , Ergocalciferol , (DRISDOL ) 1.25 MG (50000 UNIT) CAPS capsule Take 1 capsule (50,000 Units total) by mouth every 7 (seven) days. 8 capsule 0   albuterol  (VENTOLIN  HFA) 108 (90 Base) MCG/ACT inhaler Inhale into the lungs.     clonazePAM  (KLONOPIN ) 0.5 MG tablet Take 0.5 mg by mouth daily as needed for anxiety.  1   DEXILANT  60 MG capsule TAKE 1 CAPSULE BY MOUTH EVERY DAY (Patient taking differently: Take 60 mg by mouth every evening.) 90 capsule 1    Pertinent medications related to GI and procedure were reviewed by me with the patient prior to the procedure   Current Facility-Administered Medications:    0.9 %  sodium chloride  infusion, , Intravenous, Continuous, Onita Elspeth Sharper, DO  sodium chloride          Allergies  Allergen Reactions   Sulfa Antibiotics Rash   Sulfasalazine Rash   Allergies were reviewed by me prior to the procedure  Objective   Body mass index is 29.48 kg/m. Vitals:   07/22/24 1246  BP: 125/85  Pulse: 85  Resp: 16  Temp: (!) 96.2 F (35.7 C)  TempSrc: Temporal  SpO2: 100%  Weight: 85.4 kg  Height: 5' 7 (1.702 m)     Physical Exam Vitals and nursing note reviewed.  Constitutional:      General: She is not in acute distress.    Appearance: Normal appearance. She is obese. She is not ill-appearing, toxic-appearing or diaphoretic.  HENT:     Head: Normocephalic and atraumatic.     Nose: Nose normal.     Mouth/Throat:     Mouth: Mucous membranes are moist.     Pharynx: Oropharynx is clear.  Eyes:     General: No scleral icterus.    Extraocular Movements: Extraocular movements intact.  Cardiovascular:     Rate and Rhythm: Normal rate and regular rhythm.     Heart sounds: Normal heart sounds. No murmur heard.    No friction rub. No gallop.  Pulmonary:     Effort: Pulmonary effort is normal. No respiratory distress.     Breath sounds:  Normal breath sounds. No wheezing, rhonchi or rales.  Abdominal:     General: Bowel sounds are normal. There is no distension.     Palpations: Abdomen is soft.     Tenderness: There is no abdominal tenderness. There is no guarding or rebound.  Musculoskeletal:     Cervical back: Neck supple.     Right lower leg: No edema.     Left lower leg: No edema.  Skin:    General: Skin is warm and dry.     Coloration: Skin is not jaundiced or pale.  Neurological:     General: No focal deficit present.     Mental Status: She is alert and oriented to person, place, and time. Mental status is at baseline.  Psychiatric:  Mood and Affect: Mood normal.        Behavior: Behavior normal.        Thought Content: Thought content normal.        Judgment: Judgment normal.      Assessment:  Deborah Fields is a 38 y.o. female  who presents today for Esophagogastroduodenoscopy and Colonoscopy for Abdominal pain, nausea, bright red blood per rectum .  Plan:  Esophagogastroduodenoscopy and Colonoscopy with possible intervention today  Esophagogastroduodenoscopy and Colonoscopy with possible biopsy, control of bleeding, polypectomy, and interventions as necessary has been discussed with the patient/patient representative. Informed consent was obtained from the patient/patient representative after explaining the indication, nature, and risks of the procedure including but not limited to death, bleeding, perforation, missed neoplasm/lesions, cardiorespiratory compromise, and reaction to medications. Opportunity for questions was given and appropriate answers were provided. Patient/patient representative has verbalized understanding is amenable to undergoing the procedure.   Elspeth Ozell Jungling, DO  [facility]  Portions of the record may have been created with voice recognition software. Occasional wrong-word or 'sound-a-like' substitutions may have occurred due to the inherent  limitations of voice recognition software.  Read the chart carefully and recognize, using context, where substitutions may have occurred.

## 2024-07-22 NOTE — Op Note (Signed)
 [facility] Patient Name: Deborah Fields Procedure Date: 07/22/2024 12:51 PM MRN: [id number] Account #: [id number] Date of Birth: 08-Apr-1986 Admit Type: Outpatient Age: 38 Room: [facility] ENDO ROOM 1 Gender: Female Note Status: Finalized Instrument Name: Upper GI Scope [phone] Procedure:             Upper GI endoscopy Indications:           Dyspepsia, Nausea with vomiting Providers:             Elspeth Ozell Jungling DO, DO Medicines:             Monitored Anesthesia Care Complications:         No immediate complications. Estimated blood loss:                         Minimal. Procedure:             Pre-Anesthesia Assessment:                        - Prior to the procedure, a History and Physical was                         performed, and patient medications and allergies were                         reviewed. The patient is competent. The risks and                         benefits of the procedure and the sedation options and                         risks were discussed with the patient. All questions                         were answered and informed consent was obtained.                         Patient identification and proposed procedure were                         verified by the physician, the nurse, the anesthetist                         and the technician in the endoscopy suite. Mental                         Status Examination: alert and oriented. Airway                         Examination: normal oropharyngeal airway and neck                         mobility. Respiratory Examination: clear to                         auscultation. CV Examination: RRR, no murmurs, no S3                         or S4. Prophylactic Antibiotics: The  patient does not                         require prophylactic antibiotics. Prior                         Anticoagulants: The patient has taken no anticoagulant                         or antiplatelet agents. ASA Grade  Assessment: II - A                         patient with mild systemic disease. After reviewing                         the risks and benefits, the patient was deemed in                         satisfactory condition to undergo the procedure. The                         anesthesia plan was to use monitored anesthesia care                         (MAC). Immediately prior to administration of                         medications, the patient was re-assessed for adequacy                         to receive sedatives. The heart rate, respiratory                         rate, oxygen saturations, blood pressure, adequacy of                         pulmonary ventilation, and response to care were                         monitored throughout the procedure. The physical                         status of the patient was re-assessed after the                         procedure.                        After obtaining informed consent, the endoscope was                         passed under direct vision. Throughout the procedure,                         the patient's blood pressure, pulse, and oxygen                         saturations were monitored continuously. The Endoscope  was introduced through the mouth, and advanced to the                         third part of duodenum. The upper GI endoscopy was                         accomplished without difficulty. The patient tolerated                         the procedure well. Findings:      The duodenal bulb, first portion of the duodenum, second portion of the       duodenum and third portion of the duodenum were normal. Biopsies for       histology were taken with a cold forceps for evaluation of celiac       disease. Estimated blood loss was minimal.      The Z-line was regular. Estimated blood loss: none.      Esophagogastric landmarks were identified: the gastroesophageal junction       was found at 37 cm from the incisors.       Normal mucosa was found in the entire esophagus. Biopsies were obtained       from the proximal and distal esophagus with cold forceps for histology       of suspected eosinophilic esophagitis. Estimated blood loss was minimal.      Localized mild inflammation characterized by erosions was found in the       gastric antrum. Biopsies were taken with a cold forceps for Helicobacter       pylori testing. Estimated blood loss was minimal.      The exam of the stomach was otherwise normal. Impression:            - Normal duodenal bulb, first portion of the duodenum,                         second portion of the duodenum and third portion of                         the duodenum. Biopsied.                        - Z-line regular.                        - Esophagogastric landmarks identified.                        - Normal mucosa was found in the entire esophagus.                        - Gastritis. Biopsied.                        - Biopsies were taken with a cold forceps for                         evaluation of eosinophilic esophagitis. Recommendation:        - Patient has a contact number available for  emergencies. The signs and symptoms of potential                         delayed complications were discussed with the patient.                         Return to normal activities tomorrow. Written                         discharge instructions were provided to the patient.                        - Discharge patient to home.                        - Resume previous diet.                        - Continue present medications.                        - Await pathology results.                        - Return to GI clinic as previously scheduled.                        - Initiate proton pump inhibitor if not taking                        Re-initiate TCA medication. Can consider different one                         such as nortriptyline.                        Further  recommendations pending colonoscopy and                         pathology results.                        - The findings and recommendations were discussed with                         the patient. Procedure Code(s):     --- Professional ---                        [phone], Esophagogastroduodenoscopy, flexible,                         transoral; with biopsy, single or multiple Diagnosis Code(s):     --- Professional ---                        K29.70, Gastritis, unspecified, without bleeding                        R10.13, Epigastric pain                        R11.2, Nausea with vomiting, unspecified CPT copyright 2022 American Medical  Association. All rights reserved. The codes documented in this report are preliminary and upon coder review may  be revised to meet current compliance requirements. Attending Participation:      I personally performed the entire procedure. Elspeth Jungling, DO Elspeth Ozell Jungling DO, DO 07/22/2024 1:19:21 PM This report has been signed electronically. Number of Addenda: 0 Note Initiated On: 07/22/2024 12:51 PM Estimated Blood Loss:  Estimated blood loss was minimal.      [facility]

## 2024-07-22 NOTE — Interval H&P Note (Signed)
 History and Physical Interval Note: Preprocedure H&P from 07/22/24  was reviewed and there was no interval change after seeing and examining the patient.  Written consent was obtained from the patient after discussion of risks, benefits, and alternatives. Patient has consented to proceed with Esophagogastroduodenoscopy and Colonoscopy with possible intervention   07/22/2024 12:56 PM  Deborah Fields  has presented today for surgery, with the diagnosis of Nausea and vomiting, unspecified vomiting type (R11.2) Dyspepsia (R10.13) Abdominal pain, generalized (R10.84) Other constipation (K59.09).  The various methods of treatment have been discussed with the patient and family. After consideration of risks, benefits and other options for treatment, the patient has consented to  Procedure(s): COLONOSCOPY (N/A) EGD (ESOPHAGOGASTRODUODENOSCOPY) (N/A) as a surgical intervention.  The patient's history has been reviewed, patient examined, no change in status, stable for surgery.  I have reviewed the patient's chart and labs.  Questions were answered to the patient's satisfaction.     Elspeth Ozell Jungling

## 2024-07-22 NOTE — Anesthesia Preprocedure Evaluation (Signed)
 Anesthesia Evaluation  Patient identified by MRN, date of birth, ID band Patient awake    Reviewed: Allergy & Precautions, H&P , NPO status , Patient's Chart, lab work & pertinent test results, reviewed documented beta blocker date and time   History of Anesthesia Complications (+) PONV and history of anesthetic complications  Airway Mallampati: I  TM Distance: >3 FB Neck ROM: full    Dental  (+) Dental Advidsory Given, Teeth Intact   Pulmonary neg shortness of breath, asthma , neg sleep apnea, neg COPD, Recent URI , former smoker   Pulmonary exam normal breath sounds clear to auscultation       Cardiovascular Exercise Tolerance: Good (-) hypertension(-) angina (-) Past MI and (-) Cardiac Stents Normal cardiovascular exam+ dysrhythmias (prolonged QT) (-) Valvular Problems/Murmurs Rhythm:regular Rate:Normal     Neuro/Psych  PSYCHIATRIC DISORDERS Anxiety Depression    negative neurological ROS     GI/Hepatic Neg liver ROS, hiatal hernia, PUD,GERD  ,,  Endo/Other  negative endocrine ROS    Renal/GU Renal disease (kidney stone)  negative genitourinary   Musculoskeletal   Abdominal   Peds  Hematology negative hematology ROS (+)   Anesthesia Other Findings Past Medical History: No date: Allergic rhinitis No date: Anemia affecting pregnancy No date: Asthma No date: Erosive esophagitis No date: GAD (generalized anxiety disorder)     Comment:  a.) followed by psychiatry; b.) on BZO (clonazepam ) PRN               + sertraline  No date: GERD (gastroesophageal reflux disease) No date: Headache No date: Hiatal hernia No date: History of kidney stones No date: HLD (hyperlipidemia) 01/2014: IBS (irritable bowel syndrome) No date: MDD (major depressive disorder)     Comment:  a.) followed by psychiatry; b.) on SSRI (sertraline ) No date: MRSA (methicillin resistant Staphylococcus aureus) No date: Panic disorder without  agoraphobia     Comment:  a.) followed by psychiatry 2016: Pneumonia No date: PONV (postoperative nausea and vomiting) No date: Prolonged QT interval No date: Tachycardia   Reproductive/Obstetrics negative OB ROS                              Anesthesia Physical Anesthesia Plan  ASA: 2  Anesthesia Plan: General   Post-op Pain Management:    Induction: Intravenous  PONV Risk Score and Plan: 4 or greater and Propofol  infusion, TIVA and Treatment may vary due to age or medical condition  Airway Management Planned: Natural Airway and Nasal Cannula  Additional Equipment:   Intra-op Plan:   Post-operative Plan:   Informed Consent: I have reviewed the patients History and Physical, chart, labs and discussed the procedure including the risks, benefits and alternatives for the proposed anesthesia with the patient or authorized representative who has indicated his/her understanding and acceptance.     Dental Advisory Given  Plan Discussed with: Anesthesiologist, CRNA and Surgeon  Anesthesia Plan Comments:          Anesthesia Quick Evaluation

## 2024-07-22 NOTE — Op Note (Signed)
 [facility] Gastroenterology Patient Name: Deborah Fields Procedure Date: 07/22/2024 12:51 PM MRN: [id number] Account #: [id number] Date of Birth: [birth year] Admit Type: Outpatient Age: 38 Room: [facility] ENDO ROOM 1 Gender: Female Note Status: Finalized Instrument Name: Colon Scope [id number] Procedure:             Colonoscopy Indications:           Generalized abdominal pain Providers:             Elspeth Ozell Jungling DO, DO Medicines:             Monitored Anesthesia Care Complications:         No immediate complications. Estimated blood loss:                         Minimal. Procedure:             Pre-Anesthesia Assessment:                        - Prior to the procedure, a History and Physical was                         performed, and patient medications and allergies were                         reviewed. The patient is competent. The risks and                         benefits of the procedure and the sedation options and                         risks were discussed with the patient. All questions                         were answered and informed consent was obtained.                         Patient identification and proposed procedure were                         verified by the physician, the nurse, the anesthetist                         and the technician in the endoscopy suite. Mental                         Status Examination: alert and oriented. Airway                         Examination: normal oropharyngeal airway and neck                         mobility. Respiratory Examination: clear to                         auscultation. CV Examination: RRR, no murmurs, no S3                         or S4. Prophylactic Antibiotics: The patient does not  require prophylactic antibiotics. Prior                         Anticoagulants: The patient has taken no anticoagulant                         or antiplatelet agents. ASA Grade Assessment: II -  A                         patient with mild systemic disease. After reviewing                         the risks and benefits, the patient was deemed in                         satisfactory condition to undergo the procedure. The                         anesthesia plan was to use monitored anesthesia care                         (MAC). Immediately prior to administration of                         medications, the patient was re-assessed for adequacy                         to receive sedatives. The heart rate, respiratory                         rate, oxygen saturations, blood pressure, adequacy of                         pulmonary ventilation, and response to care were                         monitored throughout the procedure. The physical                         status of the patient was re-assessed after the                         procedure.                        After obtaining informed consent, the colonoscope was                         passed under direct vision. Throughout the procedure,                         the patient's blood pressure, pulse, and oxygen                         saturations were monitored continuously. The                         Colonoscope was introduced through the anus and  advanced to the the terminal ileum, with                         identification of the appendiceal orifice and IC                         valve. The colonoscopy was performed without                         difficulty. The patient tolerated the procedure well.                         The quality of the bowel preparation was evaluated                         using the BBPS [facility] Bowel Preparation Scale) with                         scores of: Right Colon = 3, Transverse Colon = 3 and                         Left Colon = 3 (entire mucosa seen well with no                         residual staining, small fragments of stool or opaque                          liquid). The total BBPS score equals 9. The terminal                         ileum, ileocecal valve, appendiceal orifice, and                         rectum were photographed. Findings:      The perianal and digital rectal examinations were normal. Pertinent       negatives include normal sphincter tone.      The terminal ileum appeared normal. Estimated blood loss: none.      Normal mucosa was found in the entire colon. Biopsies for histology were       taken with a cold forceps from the right colon and left colon for       evaluation of microscopic colitis. Estimated blood loss was minimal.      Non-bleeding internal hemorrhoids were found during retroflexion. The       hemorrhoids were small. Estimated blood loss: none.      The lumen of the transverse colon and ascending colon was mildly       dilated. Estimated blood loss: none.      The exam was otherwise without abnormality on direct and retroflexion       views. Impression:            - The examined portion of the ileum was normal.                        - Normal mucosa in the entire examined colon. Biopsied.                        - Non-bleeding internal  hemorrhoids.                        - Dilated in the transverse colon and in the ascending                         colon.                        - The examination was otherwise normal on direct and                         retroflexion views. Recommendation:        - Patient has a contact number available for                         emergencies. The signs and symptoms of potential                         delayed complications were discussed with the patient.                         Return to normal activities tomorrow. Written                         discharge instructions were provided to the patient.                        - Discharge patient to home.                        - Resume previous diet.                        - Continue present medications.                        -  Use bentyl  or levsin if not already using for                         abdominal discomfort                        recommend initiation of tca                        continue with bowel regimen at home                        consider sibo testing if not already performed                        - Await pathology results.                        - Repeat colonoscopy in 10 years for screening                         purposes.                        - Return to GI office as previously scheduled.                        -  The findings and recommendations were discussed with                         the patient. Procedure Code(s):     --- Professional ---                        [phone], Colonoscopy, flexible; with biopsy, single or                         multiple Diagnosis Code(s):     --- Professional ---                        K64.8, Other hemorrhoids                        K59.39, Other megacolon                        R10.84, Generalized abdominal pain CPT copyright 2022 American Medical Association. All rights reserved. The codes documented in this report are preliminary and upon coder review may  be revised to meet current compliance requirements. Attending Participation:      I personally performed the entire procedure. Elspeth Jungling, DO Elspeth Ozell Jungling DO, DO 07/22/2024 1:42:03 PM This report has been signed electronically. Number of Addenda: 0 Note Initiated On: 07/22/2024 12:51 PM Scope Withdrawal Time: 0 hours 7 minutes 52 seconds  Total Procedure Duration: 0 hours 14 minutes 42 seconds  Estimated Blood Loss:  Estimated blood loss was minimal.      [facility]

## 2024-07-22 NOTE — Transfer of Care (Signed)
 Immediate Anesthesia Transfer of Care Note  Patient: Deborah Fields  Procedure(s) Performed: COLONOSCOPY EGD (ESOPHAGOGASTRODUODENOSCOPY)  Patient Location: PACU  Anesthesia Type:General  Level of Consciousness: sedated  Airway & Oxygen Therapy: Patient Spontanous Breathing  Post-op Assessment: Report given to RN and Post -op Vital signs reviewed and stable  Post vital signs: Reviewed and stable  Last Vitals:  Vitals Value Taken Time  BP 85/55 07/22/24 13:41  Temp 37 C 07/22/24 13:41  Pulse 79 07/22/24 13:43  Resp 13 07/22/24 13:43  SpO2 99 % 07/22/24 13:43  Vitals shown include unfiled device data.  Last Pain:  Vitals:   07/22/24 1341  TempSrc: Temporal  PainSc: Asleep         Complications: No notable events documented.

## 2024-07-24 NOTE — Anesthesia Postprocedure Evaluation (Signed)
 Anesthesia Post Note  Patient: Deborah Fields  Procedure(s) Performed: COLONOSCOPY EGD (ESOPHAGOGASTRODUODENOSCOPY)  Patient location during evaluation: Endoscopy Anesthesia Type: General Level of consciousness: awake and alert Pain management: pain level controlled Vital Signs Assessment: post-procedure vital signs reviewed and stable Respiratory status: spontaneous breathing, nonlabored ventilation, respiratory function stable and patient connected to nasal cannula oxygen Cardiovascular status: blood pressure returned to baseline and stable Postop Assessment: no apparent nausea or vomiting Anesthetic complications: no   No notable events documented.   Last Vitals:  Vitals:   07/22/24 1341 07/22/24 1401  BP: (!) 85/55   Pulse:  68  Resp:    Temp: 37 C   SpO2:      Last Pain:  Vitals:   07/22/24 1401  TempSrc:   PainSc: 0-No pain                 Prentice Murphy

## 2024-07-25 LAB — SURGICAL PATHOLOGY

## 2024-08-08 ENCOUNTER — Other Ambulatory Visit: Payer: Self-pay | Admitting: Internal Medicine

## 2024-08-08 DIAGNOSIS — G8929 Other chronic pain: Secondary | ICD-10-CM

## 2024-08-08 DIAGNOSIS — R42 Dizziness and giddiness: Secondary | ICD-10-CM

## 2024-08-12 ENCOUNTER — Ambulatory Visit
Admission: RE | Admit: 2024-08-12 | Discharge: 2024-08-12 | Disposition: A | Discharge status: Home / Self Care | Source: Ambulatory Visit | Attending: Internal Medicine | Admitting: Internal Medicine

## 2024-08-12 DIAGNOSIS — R519 Headache, unspecified: Secondary | ICD-10-CM | POA: Insufficient documentation

## 2024-08-12 DIAGNOSIS — R42 Dizziness and giddiness: Secondary | ICD-10-CM | POA: Insufficient documentation

## 2024-08-12 DIAGNOSIS — G8929 Other chronic pain: Secondary | ICD-10-CM | POA: Insufficient documentation

## 2024-08-12 MED ORDER — GADOBUTROL 1 MMOL/ML IV SOLN
8.0000 mL | Freq: Once | INTRAVENOUS | Status: AC | PRN
  Administered 2024-08-12: 8 mL via INTRAVENOUS

## 2024-10-08 ENCOUNTER — Ambulatory Visit: Payer: Self-pay

## 2024-10-19 ENCOUNTER — Other Ambulatory Visit: Payer: Self-pay | Admitting: Neurology

## 2024-10-19 DIAGNOSIS — G35D Multiple sclerosis, unspecified: Secondary | ICD-10-CM

## 2024-10-20 ENCOUNTER — Other Ambulatory Visit: Payer: Self-pay

## 2024-10-20 MED ORDER — ZEPBOUND 2.5 MG/0.5ML ~~LOC~~ SOAJ
2.5000 mg | SUBCUTANEOUS | 1 refills | Status: DC
  Filled 2024-10-20 – 2024-10-28 (×2): qty 2, 28d supply, fill #0
  Filled 2024-12-02: qty 2, 28d supply, fill #1

## 2024-10-26 ENCOUNTER — Other Ambulatory Visit: Payer: Self-pay | Admitting: Neurology

## 2024-10-26 DIAGNOSIS — M542 Cervicalgia: Secondary | ICD-10-CM

## 2024-10-28 ENCOUNTER — Other Ambulatory Visit: Payer: Self-pay

## 2024-10-29 ENCOUNTER — Other Ambulatory Visit: Payer: Self-pay

## 2024-11-14 ENCOUNTER — Encounter: Payer: Self-pay | Admitting: Radiology

## 2024-11-14 ENCOUNTER — Ambulatory Visit
Admission: RE | Admit: 2024-11-14 | Discharge: 2024-11-14 | Disposition: A | Source: Ambulatory Visit | Attending: Neurology | Admitting: Neurology

## 2024-11-14 DIAGNOSIS — M542 Cervicalgia: Secondary | ICD-10-CM

## 2024-11-14 DIAGNOSIS — G35D Multiple sclerosis, unspecified: Secondary | ICD-10-CM | POA: Diagnosis present

## 2024-11-14 MED ORDER — GADOBUTROL 1 MMOL/ML IV SOLN
8.0000 mL | Freq: Once | INTRAVENOUS | Status: AC | PRN
  Administered 2024-11-14: 8 mL via INTRAVENOUS

## 2024-12-02 ENCOUNTER — Other Ambulatory Visit: Payer: Self-pay

## 2025-01-05 ENCOUNTER — Other Ambulatory Visit: Payer: Self-pay

## 2025-01-06 ENCOUNTER — Other Ambulatory Visit: Payer: Self-pay

## 2025-01-06 MED ORDER — ZEPBOUND 2.5 MG/0.5ML ~~LOC~~ SOAJ
2.5000 mg | SUBCUTANEOUS | 1 refills | Status: AC
  Filled 2025-01-06: qty 2, 28d supply, fill #0
